# Patient Record
Sex: Female | Born: 1970
Health system: Southern US, Community
[De-identification: ages and names within clinical notes are randomized; demographics above are authoritative.]

## PROBLEM LIST (undated history)

## (undated) DIAGNOSIS — IMO0002 Reserved for concepts with insufficient information to code with codable children: Secondary | ICD-10-CM

## (undated) DIAGNOSIS — B20 Human immunodeficiency virus [HIV] disease: Secondary | ICD-10-CM

## (undated) DIAGNOSIS — Z87898 Personal history of other specified conditions: Secondary | ICD-10-CM

## (undated) DIAGNOSIS — F32A Depression, unspecified: Secondary | ICD-10-CM

## (undated) DIAGNOSIS — R87619 Unspecified abnormal cytological findings in specimens from cervix uteri: Secondary | ICD-10-CM

## (undated) DIAGNOSIS — F329 Major depressive disorder, single episode, unspecified: Secondary | ICD-10-CM

## (undated) DIAGNOSIS — M549 Dorsalgia, unspecified: Secondary | ICD-10-CM

## (undated) HISTORY — DX: Unspecified abnormal cytological findings in specimens from cervix uteri: R87.619

## (undated) HISTORY — DX: Dorsalgia, unspecified: M54.9

## (undated) HISTORY — PX: NO PAST SURGERIES: SHX2092

## (undated) HISTORY — DX: Reserved for concepts with insufficient information to code with codable children: IMO0002

## (undated) HISTORY — DX: Major depressive disorder, single episode, unspecified: F32.9

## (undated) HISTORY — DX: Human immunodeficiency virus (HIV) disease: B20

## (undated) HISTORY — DX: Depression, unspecified: F32.A

---

## 2008-10-15 ENCOUNTER — Emergency Department (HOSPITAL_COMMUNITY): Admission: EM | Admit: 2008-10-15 | Discharge: 2008-10-15 | Payer: Self-pay | Admitting: Emergency Medicine

## 2009-01-01 ENCOUNTER — Encounter: Payer: Self-pay | Admitting: Infectious Disease

## 2009-01-01 ENCOUNTER — Emergency Department (HOSPITAL_COMMUNITY): Admission: EM | Admit: 2009-01-01 | Discharge: 2009-01-01 | Payer: Self-pay | Admitting: Family Medicine

## 2009-02-19 ENCOUNTER — Emergency Department (HOSPITAL_COMMUNITY): Admission: EM | Admit: 2009-02-19 | Discharge: 2009-02-19 | Payer: Self-pay | Admitting: Emergency Medicine

## 2009-02-21 ENCOUNTER — Emergency Department (HOSPITAL_COMMUNITY): Admission: EM | Admit: 2009-02-21 | Discharge: 2009-02-21 | Payer: Self-pay | Admitting: Emergency Medicine

## 2009-03-08 ENCOUNTER — Ambulatory Visit: Payer: Self-pay | Admitting: Nurse Practitioner

## 2009-03-08 DIAGNOSIS — M545 Low back pain: Secondary | ICD-10-CM

## 2009-03-08 DIAGNOSIS — J309 Allergic rhinitis, unspecified: Secondary | ICD-10-CM | POA: Insufficient documentation

## 2009-03-11 ENCOUNTER — Ambulatory Visit: Payer: Self-pay | Admitting: *Deleted

## 2009-09-12 ENCOUNTER — Emergency Department (HOSPITAL_COMMUNITY): Admission: EM | Admit: 2009-09-12 | Discharge: 2009-09-12 | Payer: Self-pay | Admitting: Family Medicine

## 2009-09-17 ENCOUNTER — Emergency Department (HOSPITAL_COMMUNITY): Admission: EM | Admit: 2009-09-17 | Discharge: 2009-09-17 | Payer: Self-pay | Admitting: Emergency Medicine

## 2009-10-07 ENCOUNTER — Ambulatory Visit: Payer: Self-pay | Admitting: Infectious Disease

## 2009-10-07 DIAGNOSIS — B20 Human immunodeficiency virus [HIV] disease: Secondary | ICD-10-CM

## 2009-10-07 DIAGNOSIS — Z21 Asymptomatic human immunodeficiency virus [HIV] infection status: Secondary | ICD-10-CM | POA: Insufficient documentation

## 2009-10-07 LAB — CONVERTED CEMR LAB
Albumin: 3.5 g/dL (ref 3.5–5.2)
Alkaline Phosphatase: 60 units/L (ref 39–117)
CO2: 20 meq/L (ref 19–32)
Casts: NONE SEEN /lpf
Chlamydia, Swab/Urine, PCR: NEGATIVE
Chloride: 102 meq/L (ref 96–112)
Crystals: NONE SEEN
GC Probe Amp, Urine: NEGATIVE
Glucose, Bld: 106 mg/dL — ABNORMAL HIGH (ref 70–99)
HCV Ab: REACTIVE — AB
HDL: 34 mg/dL — ABNORMAL LOW (ref >39)
HIV-1 antibody: POSITIVE — AB
HIV-2 Ab: UNDETERMINED — AB
Hep B Core Total Ab: POSITIVE — AB
Leukocytes, UA: NEGATIVE
Lymphocytes Relative: 40 % (ref 12–46)
Lymphs Abs: 1.3 10*3/uL (ref 0.7–4.0)
MCHC: 33 g/dL (ref 30.0–36.0)
Monocytes Absolute: 0.5 10*3/uL (ref 0.1–1.0)
Monocytes Relative: 15 % — ABNORMAL HIGH (ref 3–12)
Neutro Abs: 1.3 10*3/uL — ABNORMAL LOW (ref 1.7–7.7)
Neutrophils Relative %: 42 % — ABNORMAL LOW (ref 43–77)
Protein, ur: NEGATIVE mg/dL
RBC: 3.61 M/uL — ABNORMAL LOW (ref 3.87–5.11)
Squamous Epithelial / LPF: NONE SEEN /lpf
Total CHOL/HDL Ratio: 5
Urine Glucose: NEGATIVE mg/dL
Urobilinogen, UA: 0.2 (ref 0.0–1.0)
VLDL: 29 mg/dL (ref 0–40)
WBC: 3.1 10*3/uL — ABNORMAL LOW (ref 4.0–10.5)
pH: 6.5 (ref 5.0–8.0)

## 2009-10-09 ENCOUNTER — Emergency Department (HOSPITAL_COMMUNITY): Admission: EM | Admit: 2009-10-09 | Discharge: 2009-10-09 | Payer: Self-pay | Admitting: Family Medicine

## 2009-10-21 ENCOUNTER — Ambulatory Visit: Payer: Self-pay | Admitting: Infectious Disease

## 2009-10-21 DIAGNOSIS — H9319 Tinnitus, unspecified ear: Secondary | ICD-10-CM | POA: Insufficient documentation

## 2009-10-21 DIAGNOSIS — R519 Headache, unspecified: Secondary | ICD-10-CM | POA: Insufficient documentation

## 2009-10-21 DIAGNOSIS — R209 Unspecified disturbances of skin sensation: Secondary | ICD-10-CM | POA: Insufficient documentation

## 2009-10-21 DIAGNOSIS — R51 Headache: Secondary | ICD-10-CM

## 2009-10-21 DIAGNOSIS — N72 Inflammatory disease of cervix uteri: Secondary | ICD-10-CM

## 2009-10-21 LAB — CONVERTED CEMR LAB
Chloride: 102 meq/L (ref 96–112)
Crypto Ag: NEGATIVE
HCT: 28.9 % — ABNORMAL LOW (ref 36.0–46.0)
Hemoglobin: 10 g/dL — ABNORMAL LOW (ref 12.0–15.0)
Lymphs Abs: 1.5 10*3/uL (ref 0.7–4.0)
MCHC: 34.8 g/dL (ref 30.0–36.0)
Platelets: 202 10*3/uL (ref 150–400)
Potassium: 3.7 meq/L (ref 3.5–5.3)
RDW: 15.2 % (ref 11.5–15.5)
WBC: 4.4 10*3/uL (ref 4.0–10.5)

## 2009-12-04 ENCOUNTER — Encounter: Payer: Self-pay | Admitting: Infectious Disease

## 2009-12-04 ENCOUNTER — Emergency Department (HOSPITAL_COMMUNITY): Admission: EM | Admit: 2009-12-04 | Discharge: 2009-12-04 | Payer: Self-pay | Admitting: Emergency Medicine

## 2009-12-14 ENCOUNTER — Emergency Department (HOSPITAL_COMMUNITY): Admission: EM | Admit: 2009-12-14 | Discharge: 2009-12-14 | Payer: Self-pay | Admitting: Family Medicine

## 2010-01-08 ENCOUNTER — Emergency Department (HOSPITAL_COMMUNITY): Admission: EM | Admit: 2010-01-08 | Discharge: 2010-01-08 | Payer: Self-pay | Admitting: Family Medicine

## 2010-11-23 ENCOUNTER — Encounter: Payer: Self-pay | Admitting: Infectious Disease

## 2010-11-24 ENCOUNTER — Encounter: Payer: Self-pay | Admitting: Internal Medicine

## 2011-02-03 LAB — T-HELPER CELL (CD4) - (RCID CLINIC ONLY): CD4 % Helper T Cell: 12 % — ABNORMAL LOW (ref 33–55)

## 2011-02-04 LAB — CBC
MCHC: 34.4 g/dL (ref 30.0–36.0)
MCV: 88.7 fL (ref 78.0–100.0)
Platelets: 157 10*3/uL (ref 150–400)
RBC: 3.28 MIL/uL — ABNORMAL LOW (ref 3.87–5.11)
WBC: 4.8 10*3/uL (ref 4.0–10.5)

## 2011-02-04 LAB — DIFFERENTIAL
Basophils Relative: 0 % (ref 0–1)
Eosinophils Absolute: 0.8 10*3/uL — ABNORMAL HIGH (ref 0.0–0.7)
Lymphocytes Relative: 25 % (ref 12–46)
Lymphs Abs: 1.2 10*3/uL (ref 0.7–4.0)
Monocytes Absolute: 0.6 10*3/uL (ref 0.1–1.0)
Neutrophils Relative %: 46 % (ref 43–77)

## 2011-02-12 LAB — POCT I-STAT, CHEM 8
BUN: 8 mg/dL (ref 6–23)
Calcium, Ion: 1.25 mmol/L (ref 1.12–1.32)
Chloride: 102 mEq/L (ref 96–112)
Creatinine, Ser: 0.9 mg/dL (ref 0.4–1.2)
Glucose, Bld: 90 mg/dL (ref 70–99)
Hemoglobin: 13.3 g/dL (ref 12.0–15.0)
TCO2: 31 mmol/L (ref 0–100)

## 2011-02-12 LAB — POCT URINALYSIS DIP (DEVICE)
Nitrite: NEGATIVE
Protein, ur: NEGATIVE mg/dL
Specific Gravity, Urine: 1.01 (ref 1.005–1.030)
Urobilinogen, UA: 0.2 mg/dL (ref 0.0–1.0)

## 2011-02-12 LAB — CBC
HCT: 33.5 % — ABNORMAL LOW (ref 36.0–46.0)
WBC: 3.5 10*3/uL — ABNORMAL LOW (ref 4.0–10.5)

## 2011-02-12 LAB — DIFFERENTIAL
Basophils Absolute: 0 10*3/uL (ref 0.0–0.1)
Basophils Relative: 0 % (ref 0–1)
Lymphocytes Relative: 35 % (ref 12–46)
Lymphs Abs: 1.2 10*3/uL (ref 0.7–4.0)
Monocytes Absolute: 0.5 10*3/uL (ref 0.1–1.0)
Monocytes Relative: 15 % — ABNORMAL HIGH (ref 3–12)
Neutro Abs: 1.7 10*3/uL (ref 1.7–7.7)

## 2011-06-05 ENCOUNTER — Other Ambulatory Visit (INDEPENDENT_AMBULATORY_CARE_PROVIDER_SITE_OTHER): Payer: 59

## 2011-06-05 ENCOUNTER — Encounter: Payer: Self-pay | Admitting: Internal Medicine

## 2011-06-05 DIAGNOSIS — N72 Inflammatory disease of cervix uteri: Secondary | ICD-10-CM

## 2011-06-05 DIAGNOSIS — B20 Human immunodeficiency virus [HIV] disease: Secondary | ICD-10-CM

## 2011-06-05 DIAGNOSIS — Z79899 Other long term (current) drug therapy: Secondary | ICD-10-CM

## 2011-06-05 DIAGNOSIS — Z113 Encounter for screening for infections with a predominantly sexual mode of transmission: Secondary | ICD-10-CM

## 2011-06-05 LAB — URINALYSIS, ROUTINE W REFLEX MICROSCOPIC
Ketones, ur: NEGATIVE mg/dL
Nitrite: NEGATIVE
Specific Gravity, Urine: 1.011 (ref 1.005–1.030)
Urobilinogen, UA: 0.2 mg/dL (ref 0.0–1.0)
pH: 6.5 (ref 5.0–8.0)

## 2011-06-05 LAB — LIPID PANEL
Cholesterol: 170 mg/dL (ref 0–200)
HDL: 35 mg/dL — ABNORMAL LOW (ref >39)
LDL Cholesterol: 119 mg/dL — ABNORMAL HIGH (ref 0–99)
VLDL: 16 mg/dL (ref 0–40)

## 2011-06-05 LAB — COMPREHENSIVE METABOLIC PANEL
AST: 33 U/L (ref 0–37)
Albumin: 3.2 g/dL — ABNORMAL LOW (ref 3.5–5.2)
Alkaline Phosphatase: 69 U/L (ref 39–117)
BUN: 18 mg/dL (ref 6–23)
CO2: 23 mEq/L (ref 19–32)
Calcium: 8.9 mg/dL (ref 8.4–10.5)
Chloride: 104 mEq/L (ref 96–112)
Glucose, Bld: 114 mg/dL — ABNORMAL HIGH (ref 70–99)
Potassium: 3.7 mEq/L (ref 3.5–5.3)
Total Bilirubin: 0.2 mg/dL — ABNORMAL LOW (ref 0.3–1.2)

## 2011-06-05 LAB — CBC WITH DIFFERENTIAL/PLATELET
HCT: 30.1 % — ABNORMAL LOW (ref 36.0–46.0)
Hemoglobin: 9.9 g/dL — ABNORMAL LOW (ref 12.0–15.0)
Lymphs Abs: 1 10*3/uL (ref 0.7–4.0)
MCH: 29 pg (ref 26.0–34.0)
MCHC: 32.9 g/dL (ref 30.0–36.0)
MCV: 88.3 fL (ref 78.0–100.0)
Monocytes Relative: 11 % (ref 3–12)
Neutrophils Relative %: 56 % (ref 43–77)
Platelets: 140 10*3/uL — ABNORMAL LOW (ref 150–400)
RDW: 15.5 % (ref 11.5–15.5)

## 2011-06-05 LAB — T-HELPER CELL (CD4) - (RCID CLINIC ONLY)
CD4 % Helper T Cell: 7 % — ABNORMAL LOW (ref 33–55)
CD4 T Cell Abs: 70 uL — ABNORMAL LOW (ref 400–2700)

## 2011-06-06 LAB — HEPATITIS C ANTIBODY: HCV Ab: NEGATIVE

## 2011-06-06 LAB — URINALYSIS, MICROSCOPIC ONLY: Casts: NONE SEEN

## 2011-06-06 LAB — GC/CHLAMYDIA PROBE AMP, URINE: GC Probe Amp, Urine: NEGATIVE

## 2011-06-11 NOTE — Progress Notes (Signed)
BC enrolled pt into the bridge counseling program today. Pt had lab work today and will return on August 17th to see Dr, Luciana Axe and discuss medications.

## 2011-06-19 ENCOUNTER — Encounter: Payer: Self-pay | Admitting: Internal Medicine

## 2011-06-19 ENCOUNTER — Ambulatory Visit: Payer: Self-pay

## 2011-06-19 ENCOUNTER — Other Ambulatory Visit: Payer: Self-pay | Admitting: *Deleted

## 2011-06-19 ENCOUNTER — Ambulatory Visit (INDEPENDENT_AMBULATORY_CARE_PROVIDER_SITE_OTHER): Payer: 59 | Admitting: Internal Medicine

## 2011-06-19 VITALS — BP 112/73 | HR 102 | Temp 98.7°F | Ht 62.0 in | Wt 115.0 lb

## 2011-06-19 DIAGNOSIS — B20 Human immunodeficiency virus [HIV] disease: Secondary | ICD-10-CM

## 2011-06-19 DIAGNOSIS — Z23 Encounter for immunization: Secondary | ICD-10-CM

## 2011-06-19 LAB — HIV-1 GENOTYPR PLUS

## 2011-06-19 MED ORDER — DAPSONE 100 MG PO TABS: 100.0000 mg | ORAL_TABLET | Freq: Every day | ORAL | Status: DC

## 2011-06-19 MED ORDER — DARUNAVIR ETHANOLATE 400 MG PO TABS: 800.0000 mg | ORAL_TABLET | Freq: Every day | ORAL | Status: DC

## 2011-06-19 MED ORDER — RITONAVIR 100 MG PO TABS: 100.0000 mg | ORAL_TABLET | Freq: Every day | ORAL | Status: DC

## 2011-06-19 MED ORDER — ENSURE PO LIQD: 1.0000 | Freq: Two times a day (BID) | ORAL | Status: DC

## 2011-06-19 MED ORDER — EMTRICITABINE-TENOFOVIR DF 200-300 MG PO TABS: 1.0000 | ORAL_TABLET | Freq: Every day | ORAL | Status: DC

## 2011-06-19 NOTE — Assessment & Plan Note (Addendum)
I discussed with her at length her treatment options and the need for complete adherrence with the medications.  She is a little hesitant to take meds and is afraid that she will forget, but I gave her many ways (cell alarms, times of day) to try to overcome that.  She states she is though ready to start and eager to get the virus undercontrol.  I did discuss condom use and that she should have her husband tested as well.  I also will start her on dapsone for PCP prophylaxis as she had an intolerance to sulfa.  She will start boosted Prezista and Truvada when she is signed up for ADAP and will check labs soon after that.  Her creat is mildly elevated so will be watched closely.  I will check an HLA [id number] in case that needs to be changed.     I also will get her ensure for her weight loss.  I also asked her to get her recent PAP records and bring it next visit.

## 2011-06-19 NOTE — Progress Notes (Signed)
  Subjective:    Patient ID: [name], female    DOB: 05/06/1971, 40 y.o.   MRN: [id number]  HPI here to reestablish care for her HIV.  Had been here previously about 2 years ago and was initially going to start  Boosted Prezista and Truvada but did not continue to follow up or get ADAP.  She comes back today to again consider starting her meds again.  She has previously been on Bactrim but reported side effects from it and has not been on it since.  She otherwise reports no hospitalizations, STIs and she states she did [facility].  She is married and her partner does know her status.  She also reports some recent weight loss.      Review of Systems  Constitutional: Positive for fatigue and unexpected weight change. Negative for activity change.  HENT: Negative.   Eyes: Negative.   Respiratory: Negative.   Cardiovascular: Negative.   Gastrointestinal: Negative.   Genitourinary: Negative.   Musculoskeletal: Negative.   Skin: Negative.   Neurological: Negative.   Hematological: Negative.   Psychiatric/Behavioral: Negative.        Objective:   Physical Exam  Constitutional: She is oriented to person, place, and time. She appears well-developed.       thin  Neck: Normal range of motion. Neck supple.  Cardiovascular: Normal rate, regular rhythm and normal heart sounds.   No murmur heard. Pulmonary/Chest: Effort normal and breath sounds normal. No respiratory distress.  Abdominal: Soft. Bowel sounds are normal. She exhibits no distension.  Lymphadenopathy:    She has no cervical adenopathy.  Neurological: She is alert and oriented to person, place, and time.  Skin: Skin is warm and dry. No erythema.  Psychiatric: She has a normal mood and affect. Her behavior is normal.          Assessment & Plan:

## 2011-06-29 ENCOUNTER — Ambulatory Visit: Payer: Self-pay

## 2011-07-17 ENCOUNTER — Other Ambulatory Visit: Payer: Self-pay | Admitting: *Deleted

## 2011-07-17 ENCOUNTER — Ambulatory Visit (INDEPENDENT_AMBULATORY_CARE_PROVIDER_SITE_OTHER): Payer: 59 | Admitting: *Deleted

## 2011-07-17 DIAGNOSIS — B20 Human immunodeficiency virus [HIV] disease: Secondary | ICD-10-CM

## 2011-07-17 DIAGNOSIS — Z23 Encounter for immunization: Secondary | ICD-10-CM

## 2011-07-17 MED ORDER — DAPSONE 100 MG PO TABS: 100.0000 mg | ORAL_TABLET | Freq: Every day | ORAL | Status: DC

## 2011-08-03 ENCOUNTER — Emergency Department (HOSPITAL_COMMUNITY)
Admission: EM | Admit: 2011-08-03 | Discharge: 2011-08-04 | Payer: 59 | Source: Home / Self Care | Attending: Emergency Medicine | Admitting: Emergency Medicine

## 2011-08-03 ENCOUNTER — Emergency Department (HOSPITAL_COMMUNITY): Payer: 59

## 2011-08-03 LAB — RAPID URINE DRUG SCREEN, HOSP PERFORMED
Opiates: NOT DETECTED
Tetrahydrocannabinol: NOT DETECTED

## 2011-08-03 LAB — CBC
MCH: 31.8 pg (ref 26.0–34.0)
MCV: 95.8 fL (ref 78.0–100.0)
Platelets: 142 10*3/uL — ABNORMAL LOW (ref 150–400)
RDW: 15.2 % (ref 11.5–15.5)
WBC: 3.7 10*3/uL — ABNORMAL LOW (ref 4.0–10.5)

## 2011-08-03 LAB — URINALYSIS, ROUTINE W REFLEX MICROSCOPIC
Leukocytes, UA: NEGATIVE
Nitrite: NEGATIVE
Specific Gravity, Urine: 1.012 (ref 1.005–1.030)
pH: 7 (ref 5.0–8.0)

## 2011-08-03 LAB — POCT PREGNANCY, URINE: Preg Test, Ur: NEGATIVE

## 2011-08-03 LAB — DIFFERENTIAL
Basophils Absolute: 0 10*3/uL (ref 0.0–0.1)
Basophils Relative: 0 % (ref 0–1)
Eosinophils Absolute: 0.6 10*3/uL (ref 0.0–0.7)
Lymphocytes Relative: 60 % — ABNORMAL HIGH (ref 12–46)
Monocytes Absolute: 0.3 10*3/uL (ref 0.1–1.0)
Neutrophils Relative %: 14 % — ABNORMAL LOW (ref 43–77)

## 2011-08-03 LAB — COMPREHENSIVE METABOLIC PANEL
ALT: 17 U/L (ref 0–35)
AST: 26 U/L (ref 0–37)
Albumin: 3.3 g/dL — ABNORMAL LOW (ref 3.5–5.2)
Calcium: 9.8 mg/dL (ref 8.4–10.5)
Chloride: 102 mEq/L (ref 96–112)
Creatinine, Ser: 0.83 mg/dL (ref 0.50–1.10)
Sodium: 134 mEq/L — ABNORMAL LOW (ref 135–145)

## 2011-08-03 LAB — AMMONIA: Ammonia: <10 umol/L — ABNORMAL LOW (ref 11–60)

## 2011-08-03 LAB — PATHOLOGIST SMEAR REVIEW

## 2011-08-03 MED ORDER — GADOBENATE DIMEGLUMINE 529 MG/ML IV SOLN
10.0000 mL | Freq: Once | INTRAVENOUS | Status: AC | PRN
  Administered 2011-08-03: 10 mL via INTRAVENOUS

## 2011-08-04 ENCOUNTER — Other Ambulatory Visit: Payer: Self-pay | Admitting: Diagnostic Radiology

## 2011-08-04 ENCOUNTER — Inpatient Hospital Stay (HOSPITAL_COMMUNITY): Payer: 59

## 2011-08-04 ENCOUNTER — Encounter: Payer: Self-pay | Admitting: Internal Medicine

## 2011-08-04 ENCOUNTER — Inpatient Hospital Stay (HOSPITAL_COMMUNITY)
Admission: AD | Admit: 2011-08-04 | Discharge: 2011-08-21 | DRG: 975 | Disposition: A | Discharge status: Skilled Nursing Facility | Payer: 59 | Source: Other Acute Inpatient Hospital | Attending: Internal Medicine | Admitting: Internal Medicine

## 2011-08-04 DIAGNOSIS — B20 Human immunodeficiency virus [HIV] disease: Principal | ICD-10-CM | POA: Diagnosis present

## 2011-08-04 DIAGNOSIS — F329 Major depressive disorder, single episode, unspecified: Secondary | ICD-10-CM | POA: Diagnosis present

## 2011-08-04 DIAGNOSIS — N179 Acute kidney failure, unspecified: Secondary | ICD-10-CM | POA: Diagnosis present

## 2011-08-04 DIAGNOSIS — R6510 Systemic inflammatory response syndrome (SIRS) of non-infectious origin without acute organ dysfunction: Secondary | ICD-10-CM | POA: Diagnosis present

## 2011-08-04 DIAGNOSIS — E876 Hypokalemia: Secondary | ICD-10-CM | POA: Diagnosis present

## 2011-08-04 DIAGNOSIS — A89 Unspecified viral infection of central nervous system: Secondary | ICD-10-CM

## 2011-08-04 DIAGNOSIS — F3289 Other specified depressive episodes: Secondary | ICD-10-CM | POA: Diagnosis present

## 2011-08-04 DIAGNOSIS — B9789 Other viral agents as the cause of diseases classified elsewhere: Secondary | ICD-10-CM | POA: Diagnosis present

## 2011-08-04 DIAGNOSIS — F29 Unspecified psychosis not due to a substance or known physiological condition: Secondary | ICD-10-CM | POA: Diagnosis present

## 2011-08-04 DIAGNOSIS — G9340 Encephalopathy, unspecified: Secondary | ICD-10-CM

## 2011-08-04 DIAGNOSIS — Z9119 Patient's noncompliance with other medical treatment and regimen: Secondary | ICD-10-CM

## 2011-08-04 DIAGNOSIS — Z91199 Patient's noncompliance with other medical treatment and regimen due to unspecified reason: Secondary | ICD-10-CM

## 2011-08-04 DIAGNOSIS — G9349 Other encephalopathy: Secondary | ICD-10-CM | POA: Diagnosis present

## 2011-08-04 DIAGNOSIS — Z79899 Other long term (current) drug therapy: Secondary | ICD-10-CM

## 2011-08-04 DIAGNOSIS — T50995A Adverse effect of other drugs, medicaments and biological substances, initial encounter: Secondary | ICD-10-CM | POA: Diagnosis present

## 2011-08-04 DIAGNOSIS — Z23 Encounter for immunization: Secondary | ICD-10-CM

## 2011-08-04 LAB — CBC
MCV: 95.1 fL (ref 78.0–100.0)
Platelets: 143 10*3/uL — ABNORMAL LOW (ref 150–400)
RBC: 3.26 MIL/uL — ABNORMAL LOW (ref 3.87–5.11)
WBC: 3.3 10*3/uL — ABNORMAL LOW (ref 4.0–10.5)

## 2011-08-04 LAB — CSF CELL COUNT WITH DIFFERENTIAL
RBC Count, CSF: 135 /mm3 — ABNORMAL HIGH
WBC, CSF: 4 /mm3 (ref 0–5)

## 2011-08-04 LAB — DIFFERENTIAL
Eosinophils Relative: 11 % — ABNORMAL HIGH (ref 0–5)
Lymphocytes Relative: 54 % — ABNORMAL HIGH (ref 12–46)
Monocytes Absolute: 0.4 10*3/uL (ref 0.1–1.0)
Monocytes Relative: 11 % (ref 3–12)
Neutrophils Relative %: 23 % — ABNORMAL LOW (ref 43–77)

## 2011-08-04 LAB — COMPREHENSIVE METABOLIC PANEL
AST: 23 U/L (ref 0–37)
Albumin: 3.2 g/dL — ABNORMAL LOW (ref 3.5–5.2)
Calcium: 9.6 mg/dL (ref 8.4–10.5)
Chloride: 108 mEq/L (ref 96–112)
Creatinine, Ser: 0.78 mg/dL (ref 0.50–1.10)
Total Bilirubin: 0.3 mg/dL (ref 0.3–1.2)

## 2011-08-04 LAB — APTT: aPTT: 35 seconds (ref 24–37)

## 2011-08-04 LAB — CRYPTOCOCCAL ANTIGEN, CSF: Crypto Ag: NEGATIVE

## 2011-08-04 LAB — HERPES SIMPLEX VIRUS(HSV) DNA BY PCR: HSV 1 DNA: NOT DETECTED

## 2011-08-04 LAB — MRSA PCR SCREENING: MRSA by PCR: NEGATIVE

## 2011-08-04 LAB — PROTIME-INR: INR: 1.14 (ref 0.00–1.49)

## 2011-08-04 NOTE — H&P (Signed)
Hospital Admission Note Date: 08/04/2011  Patient name: [name] Medical record number: [id number] Date of birth: 17-Mar-1971 Age: 40 y.o. Gender: female PCP: No primary provider on file.  Medical Service:  IM teaching serive  Attending physician:  Dr. Cliffton Asters  Pager: Resident (R2/R3): Dr. Saralyn Pilar  Pager: [phone] Resident (R1):Dr. Clyde Lundborg    Pager: [phone]  Chief Complaint:   History of Present Illness: Patient has been on hospitality service with critical care and neurology consulting. She is being transferred to teaching service today.   40 y/o woman with pmh of HIV, CD4 count 70 as of 06/2011 not on any antiretroviral due to non compliance was brought to [facility] by patients brother for confusion that has been going on for 1 week. She is currently sedated and unable to answer questions. The history in obtained from patient's chart and other physicians. The patient has been leaving food on the stove, unable to turn on warm water for bathing purpose, has been unsteady on her feet and has been unable to recognize family members. Her symptoms are getting progressively worse. The brother suspected that her depression his getting worse causing all these symptoms.  Patient is aware of her HIV status but has not been taking any medications. She was seen in ID clinic 2 years ago at which time she was to start taking boosted prezista and truvada but was lost to follow up. She was seen in the [facility] on 8/12 to re-establish care.   Meds: None  Allergies: NKDA PMH: HIV:  who was seen in the [place], [place] in 2010 to establish care. She was lost to follow up for 2 years after that and was not on any medications. She returned to see Dr. Luciana Axe in Aug 2012 at which time time her CD4 count was 70 and VL 243000  SH- unable to obtain FH- unable to obtain ROS- unable to obtain  Physical Exam  VS: T- 98.6, P- 59-102, RR- 13-17, BP- 93-139/69-80, O2- 100% RA  General Appearance:    Sedated  (recived ativan for LP), arousable and answers simple questions and follows 1 step commands  Head:    Normocephalic, without obvious abnormality, atraumatic  Eyes:    PERRL, conjunctiva/corneas clear, EOM's intact, fundi    benign, both eyes  Throat:   Lips, mucosa, and tongue normal; teeth and gums normal  Neck:   Supple, symmetrical, trachea midline, no adenopathy;    thyroid:  no enlargement/tenderness/nodules; no carotid   bruit or JVD  Back:     Symmetric, no curvature, ROM normal, no CVA tenderness  Lungs:     Clear to auscultation bilaterally, respirations unlabored  Chest Wall:    No tenderness or deformity   Heart:    Regular rate and rhythm, S1 and S2 normal, no murmur, rub   or gallop  Abdomen:     Soft, non-tender, bowel sounds active all four quadrants,    no masses, no organomegaly  Extremities:   Extremities normal, atraumatic, no cyanosis or edema  Pulses:   2+ and symmetric all extremities  Skin:   Skin color, texture, turgor normal, no rashes or lesions  Lymph nodes:   Cervical, supraclavicular, and axillary nodes normal  Neurologic:  Drowsy but arousable, cranial nerves appear intact, moves all four extrmeity, slurred mumbling speech, unable to assess sensations, gait or cerebellar signs.      Lab results: Basic Metabolic Panel:  Basename 08/04/11 0635 08/03/11 1050  NA 137 134*  K 3.4* 3.5  CL 108 102  CO2 22 25  GLUCOSE 75 135*  BUN 9 15  CREATININE 0.78 0.83  CALCIUM 9.6 9.8  MG 1.8 --  PHOS 3.8 --   Liver Function Tests:  [facility] 08/04/11 0635 08/03/11 1050  AST 23 26  ALT 14 17  ALKPHOS 67 71  BILITOT 0.3 0.3  PROT 9.2* 10.0*  ALBUMIN 3.2* 3.3*    Basename 08/03/11 1155  AMMONIA <10*   CBC:  Basename 08/04/11 0635 08/03/11 1050  WBC 3.3* 3.7*  NEUTROABS 0.8* 0.5*  HGB 10.4* 10.7*  HCT 31.0* 32.3*  MCV 95.1 95.8  PLT 143* 142*   Alcohol Level:  Basename 08/03/11 1050  ETH <11   Urinalysis: Color, Urine                              YELLOW            YELLOW  Appearance                               CLEAR             CLEAR  Specific Gravity                         1.012             1.005-1.030  pH                                       7.0               5.0-8.0  Urine Glucose                            NEGATIVE          NEG              mg/dL  Bilirubin                                NEGATIVE          NEG  Ketones                                  NEGATIVE          NEG              mg/dL  Blood                                    NEGATIVE          NEG  Protein                                  NEGATIVE          NEG              mg/dL  Urobilinogen  1.0               0.0-1.0          mg/dL  Nitrite                                  NEGATIVE          NEG  Leukocytes                               NEGATIVE          NEG  UDS- negative  CSF Glucose, CSF                             43                43-76            mg/dL  Total  Protein, CSF                      82         h      15-45            mg/dL   Color, CSF                               COLORLESS         COLR  Clarity                                  CLEAR             CLEAR  Supernatant                              SEE NOTE.    NOT INDICATED  WBC Count-CSF                            4                 0-5              /cu mm  RBC Count                                135        h      0                /cu mm  Segmented Neutrophils-CSF                RARE              0-6              %  Lymphocytes-CSF                          FEW               40-80            %  Monocyte-Macrophage-Spinal Fluid  RARE              15-45            %  Other Cells-CSF                          SEE NOTE.    TOO FEW TO COUNT, SMEAR AVAILABLE FOR REVIEW  Cryptococcal antigen- neg   Imaging results:  Dg Chest 2 View  08/03/2011  *RADIOLOGY REPORT*  Clinical Data: Confusion.  Altered mental status.  CHEST - 2 VIEW  Comparison:  12/04/2009  Findings:   The heart size and mediastinal contours are within normal limits.  Both lungs are clear.  Previously seen infiltrate in the left lower lobe has resolved.  The visualized skeletal structures are unremarkable.  IMPRESSION: No active cardiopulmonary disease.  Original Report Authenticated By: Danae Orleans, M.D.   Ct Head Wo Contrast  08/03/2011  *RADIOLOGY REPORT*  Clinical Data: Confusion, depression  CT HEAD WITHOUT CONTRAST  Technique:  Contiguous axial images were obtained from the base of the skull through the vertex without contrast.  Comparison: None.  Findings: There is patchy hypoattenuation in the deep white matter and posterior parietal lobes, right greater than left.  Negative for acute intracranial hemorrhage, midline shift, mass lesion, or mass effect. Acute infarction may be inapparent on noncontrast CT. Ventricles and sulci are normal in size and symmetry.  Bone windows reveal no calvarial lesion.  IMPRESSION: 1.  Negative for bleed or other acute intracranial process. 2.  Nonspecific white matter changes in the posterior parietal lobes.  Outpatient MR may be useful to differentiate  PRES from ischemic change, and exclude underlying lesions.  Original Report Authenticated By: Osa Craver, M.D.   Mr [name] XB Contrast  08/03/2011  *RADIOLOGY REPORT*  Clinical Data: Confusion.  Depression. HIV infection.  MRI HEAD WITHOUT AND WITH CONTRAST  Technique:  Multiplanar, multiecho pulse sequences of the brain and surrounding structures were obtained according to standard protocol without and with intravenous contrast  Contrast: 10mL MULTIHANCE GADOBENATE DIMEGLUMINE 529 MG/ML IV SOLN  Comparison: Head CT same day  Findings: The brainstem is normal.  There appears to be mild cerebellar atrophy.  The cerebral hemispheres show multiple widely disseminated foci of abnormal T2 and FLAIR signal primarily within the hemispheric white matter but with some cortical involvement most notably in the  region of the cingulate gyrus on the left and within the basal ganglia on the left.  Insular region on the left is also affected. There is no evidence of hemorrhage, mass lesion, hydrocephalus or extra-axial collection.  After contrast administration, there is patchy enhancement throughout many of the abnormalities.  No sinus or mastoid disease.  No pituitary lesion.  No skull or skull base lesion.  IMPRESSION: Supratentorial brain lesions widely dispersed throughout both hemispheres with edema and enhancement highly likely to represent to atypical infection related to HIV.  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  There is no evidence of hemorrhage, mass effect, shift or threatening herniation.  Original Report Authenticated By: Thomasenia Sales, M.D.   Dg Chest Portable 1 View  08/04/2011  *RADIOLOGY REPORT*  Clinical Data: Altered mental status. HIV infection.  Meningitis.  PORTABLE CHEST - 1 VIEW  Comparison: 08/03/2011  Findings: The patient is rotated to the right on today's exam, resulting in reduced diagnostic sensitivity and specificity. Cardiac and mediastinal contours appear normal.  The lungs appear clear.  No pleural effusion is identified.  IMPRESSION:  1.  No significant thoracic abnormality is observed.  Original Report Authenticated By: Dellia Cloud, M.D.   Dg Fluoro Guide Ndl Plc/bx  08/04/2011  *RADIOLOGY REPORT*  Clinical Data: HIV.  Meningitis. Altered mental status.  FLUORO GUIDED LUMBAR PUNCTURE  Comparison: MRI brain from 08/03/2011  Findings: Prior lumbar puncture attempt in the [facility] emergency department was unsuccessful.  The patient is unable to consent for herself due to altered mental status.  I discussed the risks (including hemorrhage, infection, nerve damage, and others), benefits, and alternatives to fluoroscopically guided lumbar puncture with the patient's husband by telephone.  He understood elected for his  wife to undergo the procedure.  Following sterile skin prep and local anesthetic administration consisting of 1% lidocaine, a 22 gauge needle was advanced into the thecal sac at the L2-3 level under fluoroscopic guidance.  The patient was agitated and it was not feasible to turn her on her side for a standard lateral decubitus opening pressure measurement. Measurement in the prone position yielded an opening pressure of 17 cm of water.  A total of 12 ml of CSF was obtained and distributed into four vials.  The CSF in the first vial appeared minimally blood tinged but subsequent tubes appear clear.  The needle was subsequently removed and the skin cleansed and bandaged.  No complications were observed.  IMPRESSION:  1.  Successful fluoroscopically guided lumbar puncture at the L2-3 level.  Original Report Authenticated By: Dellia Cloud, M.D.    Other results: EKG  Assessment & Plan by Problem:  1. AMS:  Given her CD4 of 70 or less, opportunistic infections with toxoplasma, atypical fungus, TB and CMV is a concern.  Cryptococcas in unlikely with negative pcr. Bacterial meningitis is unlikely based on CSF results. Lymphoma is also a possibility given the MRI appearance after discussing with neurology team.   - patient is able to protect her airway.  - will await cytology, cultures and other tests for CMV, Herpes and EBV - Patient was on amphotericin B since admission which was stopped today after cryptococcal pcr negative, she is on pyrimethamine, leucovorin and sulfadiazene for toxoplasma awating further results.  - May need a brain biopsy if above tests do not point at the cause of the brain lesions.  - ID, neurology and critical care following the patient.   2. HIV: Patient has never been on any HIV meds as far as our records are concerned. Will need a better history once patient is more responsive. Will check CD4 and viral load. She was on bactrim for prophylaxis in past but stopped taking  it due to intolerance. Will need dapsone and azithromycin once able to take po intake. Will defer HAART for now.   3. DVT Px-  SCD  R2/3______________________________      R1________________________________  ATTENDING: I performed and/or observed a history and physical examination of the patient.  I discussed the case with the residents as noted and reviewed the residents' notes.  I agree with the findings and plan--please refer to the attending physician note for more details.  Signature________________________________  Printed Name_____________________________

## 2011-08-05 ENCOUNTER — Telehealth: Payer: Self-pay | Admitting: *Deleted

## 2011-08-05 DIAGNOSIS — G9340 Encephalopathy, unspecified: Secondary | ICD-10-CM

## 2011-08-05 DIAGNOSIS — B20 Human immunodeficiency virus [HIV] disease: Secondary | ICD-10-CM

## 2011-08-05 LAB — VDRL, CSF: VDRL Quant, CSF: NONREACTIVE

## 2011-08-05 NOTE — Telephone Encounter (Signed)
Dr. Orvan Falconer wanted to know if pt ever got her meds. She is in ICU at this time. Britta Mccreedy, our financial person states she has not applied for ADAP. I checked with Sharol Roussel and she states pt has private insurance and she has gotten her meds. She has run out of all meds at the correct time & has gotten refills refills. Lab appt next week.

## 2011-08-06 DIAGNOSIS — B20 Human immunodeficiency virus [HIV] disease: Secondary | ICD-10-CM

## 2011-08-06 DIAGNOSIS — A89 Unspecified viral infection of central nervous system: Secondary | ICD-10-CM

## 2011-08-07 LAB — CYTOMEGALOVIRUS PCR, QUALITATIVE: Cytomegalovirus DNA: NOT DETECTED

## 2011-08-07 LAB — HIV-1 RNA QUANT-NO REFLEX-BLD: HIV-1 RNA Quant, Log: 2.53 {Log} — ABNORMAL HIGH (ref <1.30)

## 2011-08-07 LAB — CSF CULTURE W GRAM STAIN: Culture: NO GROWTH

## 2011-08-07 LAB — BASIC METABOLIC PANEL
BUN: 3 mg/dL — ABNORMAL LOW (ref 6–23)
CO2: 24 mEq/L (ref 19–32)
Calcium: 9.2 mg/dL (ref 8.4–10.5)
GFR calc non Af Amer: 66 mL/min — ABNORMAL LOW (ref >90)
Glucose, Bld: 74 mg/dL (ref 70–99)
Sodium: 138 mEq/L (ref 135–145)

## 2011-08-09 LAB — BASIC METABOLIC PANEL
CO2: 23 mEq/L (ref 19–32)
Calcium: 8.9 mg/dL (ref 8.4–10.5)
GFR calc non Af Amer: 66 mL/min — ABNORMAL LOW (ref >90)
Potassium: 3.1 mEq/L — ABNORMAL LOW (ref 3.5–5.1)
Sodium: 138 mEq/L (ref 135–145)

## 2011-08-09 LAB — MAGNESIUM: Magnesium: 1.6 mg/dL (ref 1.5–2.5)

## 2011-08-09 NOTE — Consult Note (Signed)
[name], [name]                  ACCOUNT NO.:  [id number]  MEDICAL RECORD NO.:  [id number]  LOCATION:  [facility]                         FACILITY:  [facility]  PHYSICIAN:  Pramod P. Pearlean Brownie, MD    DATE OF BIRTH:  [birth year]  DATE OF CONSULTATION: DATE OF DISCHARGE:                                CONSULTATION   REFERRING PHYSICIAN:  Otto Herb, MD  REASON FOR REFERRAL:  Confusion, altered mental status.  HISTORY OF PRESENT ILLNESS:  [name] is a 40 year old [place] African origin lady, who presented with 1-week history of confusion, disorientation, and some emotional lability with depression.  There is apparently no history of headache, focal extremity weakness, and numbness.  She has known history of HIV positive state for the last couple of years and she has been on antiretrovirals drugs.  The patient is unable to give detailed history and her husband cannot specifically recall if she has had any typical HIV infections, which were treated in the past.  The patient has abnormal CT scan on admission, show an extensive what appeared to be white matter disease, but an MRI scan has shown multiple small enhancing cortical and subcortical T2 and FLAIR white matter hyperintensities, which has shown announcement raising concern for chronic atypical infection, fungal versus TB or lymphoma.  PAST SURGICAL HISTORY:  As documented above, positive for HIV state.  ALLERGIES:  None listed.  HOME MEDICATIONS: 1. Norvir 1 capsule daily. 2. Truvada 200 mg daily. 3. Prezista 400 mg 2 tablets daily. 4. Dapsone 100 mg daily.  SOCIAL HISTORY:  The patient is married, lives with her husband, she is not working.  She does not abuse drugs at present or smoke or drink alcohol.  PHYSICAL EXAMINATION:  GENERAL:  Reveals young African lady, currently not in distress. VITAL SIGNS:  Temperature 98.4, pulse is 74 and regular, respiratory rate 18 per minute, blood pressure 105/68.  Distal pulses  are well felt. HEENT:  Head is nontraumatic. NECK:  Supple.  There is no neck stiffness.  Kernig's and Brudzinski signs are negative. CARDIAC EXAM:  Regular heart sounds. LUNGS:  Clear to auscultation. ABDOMEN:  Soft, nontender. NEUROLOGIC EXAM:  She is awake, alert.  She is disoriented to time, place, and person.  She has diminished attention, short-term memory and recall.  She follows simple one-step and occasional two-step commands only.  Eye movements are full range and no nystagmus.  She blinks to threat bilaterally.  Face is symmetric without weakness.  Tongue is midline.  Motor system exam reveals no upper or lower extremity drift. She is able to move all 4 extremities against gravity and symmetric in equal fashion.  There is no focal weakness.  Deep tendon reflexes are 1+ and symmetric.  Plantars are downgoing.  Touch, pinprick appeared to be preserved.  Coordination and gait cannot be reliably tested.  LABORATORY DATA:  Data reviewed, MRI scan of the brain done today shows multiple small enhancing nodular tiny lesions in subcortical regions bilaterally.  Most noticeable in the left cingulate gyrus and left insular region, which seem to be a meningeal based.  HIV test is positive.  IMPRESSION:  A 40 year old lady, who  is HIV positive with subacute onset of confusion, disorientation, and some emotional lability likely secondary to chronic atypical infection in the setting of a very positive state, which may be a fungal infection versus lymphoma or TB.  PLAN:  Recommend doing a spinal tap to obtain spinal fluid to send it for testing for the above infections.  Transferred to [facility] for [facility] evaluation.  Infectious Disease consult for management of her HIV status as well as infections.  On long discussion with the patient and her husband regarding above plan answered questions.  Kindly call for questions.  [facility] team to follow in the  morning.     Pramod P. Pearlean Brownie, MD     PPS/MEDQ  D:  08/03/2011  T:  08/04/2011  Job:  [id number]  Electronically Signed by Delia Heady MD on 08/09/2011 10:28:18 PM

## 2011-08-10 DIAGNOSIS — A89 Unspecified viral infection of central nervous system: Secondary | ICD-10-CM

## 2011-08-10 DIAGNOSIS — B20 Human immunodeficiency virus [HIV] disease: Secondary | ICD-10-CM

## 2011-08-10 LAB — CBC
Platelets: 131 10*3/uL — ABNORMAL LOW (ref 150–400)
RBC: 2.94 MIL/uL — ABNORMAL LOW (ref 3.87–5.11)
RDW: 14 % (ref 11.5–15.5)
WBC: 2.4 10*3/uL — ABNORMAL LOW (ref 4.0–10.5)

## 2011-08-10 LAB — MISCELLANEOUS TEST

## 2011-08-10 LAB — BASIC METABOLIC PANEL
CO2: 24 mEq/L (ref 19–32)
Chloride: 109 mEq/L (ref 96–112)
GFR calc Af Amer: 77 mL/min — ABNORMAL LOW (ref >90)
Potassium: 3.8 mEq/L (ref 3.5–5.1)

## 2011-08-12 DIAGNOSIS — B20 Human immunodeficiency virus [HIV] disease: Secondary | ICD-10-CM

## 2011-08-12 DIAGNOSIS — A89 Unspecified viral infection of central nervous system: Secondary | ICD-10-CM

## 2011-08-12 LAB — BASIC METABOLIC PANEL
BUN: 4 mg/dL — ABNORMAL LOW (ref 6–23)
CO2: 26 mEq/L (ref 19–32)
Chloride: 106 mEq/L (ref 96–112)
Creatinine, Ser: 0.98 mg/dL (ref 0.50–1.10)
Glucose, Bld: 84 mg/dL (ref 70–99)

## 2011-08-13 ENCOUNTER — Other Ambulatory Visit: Payer: 59

## 2011-08-13 LAB — CBC
Hemoglobin: 10.5 g/dL — ABNORMAL LOW (ref 12.0–15.0)
MCHC: 33.9 g/dL (ref 30.0–36.0)
RBC: 3.31 MIL/uL — ABNORMAL LOW (ref 3.87–5.11)

## 2011-08-14 DIAGNOSIS — A89 Unspecified viral infection of central nervous system: Secondary | ICD-10-CM

## 2011-08-14 DIAGNOSIS — B20 Human immunodeficiency virus [HIV] disease: Secondary | ICD-10-CM

## 2011-08-14 LAB — CBC
MCV: 93.8 fL (ref 78.0–100.0)
Platelets: 154 10*3/uL (ref 150–400)
RBC: 3.37 MIL/uL — ABNORMAL LOW (ref 3.87–5.11)
WBC: 2.1 10*3/uL — ABNORMAL LOW (ref 4.0–10.5)

## 2011-08-17 DIAGNOSIS — B20 Human immunodeficiency virus [HIV] disease: Secondary | ICD-10-CM

## 2011-08-17 DIAGNOSIS — A89 Unspecified viral infection of central nervous system: Secondary | ICD-10-CM

## 2011-08-17 LAB — BASIC METABOLIC PANEL
CO2: 28 mEq/L (ref 19–32)
Chloride: 100 mEq/L (ref 96–112)
Creatinine, Ser: 1.25 mg/dL — ABNORMAL HIGH (ref 0.50–1.10)
Sodium: 136 mEq/L (ref 135–145)

## 2011-08-18 LAB — BASIC METABOLIC PANEL
CO2: 27 mEq/L (ref 19–32)
Chloride: 104 mEq/L (ref 96–112)
Glucose, Bld: 133 mg/dL — ABNORMAL HIGH (ref 70–99)
Potassium: 3.3 mEq/L — ABNORMAL LOW (ref 3.5–5.1)
Sodium: 138 mEq/L (ref 135–145)

## 2011-08-19 DIAGNOSIS — B20 Human immunodeficiency virus [HIV] disease: Secondary | ICD-10-CM

## 2011-08-19 DIAGNOSIS — A89 Unspecified viral infection of central nervous system: Secondary | ICD-10-CM

## 2011-08-19 LAB — BASIC METABOLIC PANEL
BUN: 13 mg/dL (ref 6–23)
CO2: 25 mEq/L (ref 19–32)
Chloride: 105 mEq/L (ref 96–112)
Glucose, Bld: 80 mg/dL (ref 70–99)
Potassium: 3.6 mEq/L (ref 3.5–5.1)

## 2011-08-20 LAB — BASIC METABOLIC PANEL
BUN: 10 mg/dL (ref 6–23)
CO2: 23 mEq/L (ref 19–32)
Calcium: 9.5 mg/dL (ref 8.4–10.5)
GFR calc non Af Amer: 74 mL/min — ABNORMAL LOW (ref >90)
Glucose, Bld: 91 mg/dL (ref 70–99)

## 2011-08-20 LAB — CBC
HCT: 30.9 % — ABNORMAL LOW (ref 36.0–46.0)
Hemoglobin: 10.4 g/dL — ABNORMAL LOW (ref 12.0–15.0)
MCH: 31.6 pg (ref 26.0–34.0)
MCHC: 33.7 g/dL (ref 30.0–36.0)

## 2011-08-21 DIAGNOSIS — A89 Unspecified viral infection of central nervous system: Secondary | ICD-10-CM

## 2011-08-21 DIAGNOSIS — B20 Human immunodeficiency virus [HIV] disease: Secondary | ICD-10-CM

## 2011-08-21 LAB — CBC
HCT: 31.7 % — ABNORMAL LOW (ref 36.0–46.0)
MCH: 31.7 pg (ref 26.0–34.0)
MCV: 94.9 fL (ref 78.0–100.0)
Platelets: 176 10*3/uL (ref 150–400)
RBC: 3.34 MIL/uL — ABNORMAL LOW (ref 3.87–5.11)
RDW: 13.4 % (ref 11.5–15.5)

## 2011-08-21 LAB — BASIC METABOLIC PANEL
BUN: 9 mg/dL (ref 6–23)
CO2: 27 mEq/L (ref 19–32)
Calcium: 9.9 mg/dL (ref 8.4–10.5)
Creatinine, Ser: 1.08 mg/dL (ref 0.50–1.10)

## 2011-08-26 ENCOUNTER — Other Ambulatory Visit: Payer: Self-pay | Admitting: *Deleted

## 2011-08-26 DIAGNOSIS — B20 Human immunodeficiency virus [HIV] disease: Secondary | ICD-10-CM

## 2011-08-26 MED ORDER — ENSURE PO LIQD: 1.0000 | Freq: Two times a day (BID) | ORAL | Status: DC

## 2011-08-27 ENCOUNTER — Ambulatory Visit: Payer: 59 | Admitting: Internal Medicine

## 2011-08-31 NOTE — Discharge Summary (Signed)
[name], [name] NO.:  [id number]  MEDICAL RECORD NO.:  [id number]  LOCATION:  4504                         FACILITY:  [facility]  PHYSICIAN:  Cliffton Asters, M.D.    DATE OF BIRTH:  1971-01-10  DATE OF ADMISSION:  08/04/2011 DATE OF DISCHARGE:  08/21/2011                        DISCHARGE SUMMARY - REFERRING   ATTENDING PHYSICIAN:  Cliffton Asters, MD  DISCHARGE DIAGNOSES: 1. 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 2. Human immunodeficiency virus, CD4 count 110. 3. Depression. 4. Acute renal injury.  DISCHARGE MEDICATIONS: 1. Symbyax 6 mg/25 mg 1 capsule by mouth daily at bedtime. 2. Bactrim 160/800 mg 1 tablet by mouth every Monday, Wednesday, and     Friday. 3. Dapsone 100 mg 1 tablet by mouth daily. 4. Norvir 100 mg 1 capsule by mouth daily. 5. Prezista 400 mg 2 tablets by mouth daily. 6. Truvada 200/300 mg 1 tablet by mouth daily. 7. Ensure 237 mL by mouth twice daily between meals.  DISPOSITION AND FOLLOWUP:  The patient was discharged from [facility] in improved condition, though still with some gait instability and with some alteration of mental status, though both are markedly improved since admission.  The patient will be discharged to skilled nursing facility for further recovery.  The patient will follow up with Dr. Denton Meek in the Outpatient Clinic on September 01, 2011, at 9:45 a.m., at which time she will check a BMET to ensure resolution of acute renal failure and assess the patient's mental status as she recovers from her immune reconstitution syndrome.  The patient will also follow up with Dr. Luciana Axe in Infectious Disease on September 03, 2011, at 9:45 a.m. for further management of her HIV.  PROCEDURES PERFORMED: 1. Chest x-ray August 04, 2011, no significant abnormality was     observed. 2. Lumbar puncture August 04, 2011, PCR positive for JC virus. 3. CT head, nonspecific white  matter changes in the posteroparietal     lobes.  No acute bleed. 4. MR brain August 03, 2011, supratentorial brain lesions, widely     dispersed throughout both hemispheres with edema and enhancement.     No evidence of hemorrhage.  CONSULTATIONS:  Neurology.  ADMITTING HISTORY AND PHYSICAL:  The patient is a 40 year old woman with a history of HIV, CD4 count of 70 as of August 2012, who started HAART therapy 6 weeks prior to admission, was brought to the [facility] by the patient's brother for confusion that has been going on for 1 week.  She is currently sedated and unable to answer questions. The history is obtained from the patient's chart and other physicians. The patient's brother notes that she has been leaving food on the stove, unable to turn on warm water for bathing purposes, has been unsteady on her feet, and has been unable to recognize family members.  Her symptoms have progressively worsened.  The brother suspects that her depression is worsening, causing all of these symptoms.  The patient is aware of her HIV status and was seen 2 months ago in the Infectious Disease Clinic.  She started HAART therapy about 6 weeks ago.  PHYSICAL EXAMINATION:  VITAL SIGNS:  Temperature 98.6, pulse 59-102, blood pressure 93-139/69-80, respirations 13-17, oxygen saturation 100% on room air. GENERAL:  Sedated, arousable, and answers simple questions and follows 1- step commands. HEENT:  Head normocephalic, atraumatic.  Pupils equal, round, and reactive to light.  Extraocular movements intact.  Fundi benign both eyes.  Oropharynx clear, nonerythematous. NECK:  Supple.  Trachea midline.  No adenopathy.  No carotid bruit or JVD. LUNGS:  Clear to auscultation bilaterally. RESPIRATIONS:  Unlabored.  No chest wall tenderness or deformity. HEART:  Regular rate and rhythm.  S1 and S2 normal.  No murmurs, gallops, or rubs. ABDOMEN:  Soft, nontender, nondistended.  Bowel sounds present in  all 4 quadrants. EXTREMITIES:  Atraumatic.  No cyanosis, clubbing or edema. SKIN:  Skin color, texture, turgor normal.  No rashes or lesions. Neurologic:  Drowsy, but arousable.  Cranial nerves appear intact. Moves all 4 extremities.  Slurred speech.  Unable to assess sensation, gait, or cerebellar signs.  ADMITTING LABORATORY DATA:  Sodium 134, potassium 3.5, chloride 102, bicarb 25, BUN 15, creatinine 0.83, glucose 135.  AST 26, ALT 17, alk phos 71, protein 10, albumin 3.3, ammonia less than 10.  WBC 3.7, hemoglobin 10.7, hematocrit 32.3, platelets 142.  HOSPITAL COURSE BY PROBLEM: 1. Altered mental status.  The patient presented with altered mental     status.  Initial lumbar puncture was positive for JC virus by PCR.     We were initially concerned for toxoplasmosis given the patient's     low CD4 count and lack of prophylaxis against toxoplasmosis.     However, brain MRI findings were not consistent with this and later     PCR results were negative for toxoplasmosis.  The patient was     treated with antibiotics for toxoplasmosis for about 3 days, but     these were discontinued after the PCR results returned.  Given the     patient's diffuse findings on MRI, the most likely cause of her     altered mental status is JC virus-induced immune reconstitution     syndrome, though we are also considering PNL.  The patient's mental     status gradually improved over hospitalization, and by discharge     she was becoming more oriented to time, and oriented to     person, but not oriented to place.  She also remains with      unstable gait, though her gait did also improve over the course of     hospitalization.  Her immune reconstitution syndrome was likely     triggered by the commencement of HAART therapy approximately 6     weeks prior to admission.  HAART therapy was continued during     hospitalization and the patient was also discharged on her home     regimen.  Her CD4 on  admission was 110. 2. HIV.  The patient's CD4 count on admission was found to be 110,     improvement over CD4 count of 70 seen about 2 months ago.  The     patient was continued on her antiretroviral therapy during     hospitalization and was discharged on the same regimen with the     addition of Bactrim for PCP prophylaxis.  The patient will follow     up with her Infectious Disease physician, Dr. Luciana Axe on September 03, 2011. 3. Depression.  The patient presented with feelings of sadness and  multiple family stressor issues, which she noted once her episode     of acute alteration in mental status started to improve.  She was     also noted to have a very tangential throughout process, though it     was unclear whether this represented a true mental state or was a     result of her alteration in her mental status.  The patient was     started on Symbyax to cover her depression with a concern for     possible psychotic features, though again the diagnosis is unclear     in the setting of acute altered mental status.  The patient     responded positively to treatment and this medication was continued     at discharge and can be managed on an outpatient basis. 4. Acute renal injury.  The patient presented with a creatinine of     0.8, but this rose to a peak of 1.25, likely prerenal due to     decreased p.o. intake, though also possibly contributed to by IV     contrast-induced ATN or medication-induced AIN.  Creatinine     improved during hospitalization and was 1.08 on the day of     discharge.  DISCHARGE VITAL SIGNS:  Blood pressure 124/80, temperature 98.1, pulse 78, respirations 18, oxygen saturation 96% on room air.  DISCHARGE LABORATORY DATA:  WBC 2.7, hemoglobin 10.4, hematocrit 30.9, platelets 180.  Creatinine 1.08.    ______________________________ Janalyn Harder, MD   ______________________________ Cliffton Asters, M.D.    RB/MEDQ  D:  08/20/2011  T:  08/20/2011   Job:  [id number]  cc:   Gardiner Barefoot, MD  Electronically Signed by Janalyn Harder MD on 08/27/2011 03:51:25 PM Electronically Signed by Cliffton Asters M.D. on 08/31/2011 03:20:11 PM

## 2011-09-01 ENCOUNTER — Ambulatory Visit (INDEPENDENT_AMBULATORY_CARE_PROVIDER_SITE_OTHER): Payer: 59 | Admitting: Internal Medicine

## 2011-09-01 ENCOUNTER — Encounter: Payer: Self-pay | Admitting: Internal Medicine

## 2011-09-01 VITALS — BP 100/68 | HR 85 | Temp 97.5°F | Ht 62.5 in | Wt 114.0 lb

## 2011-09-01 DIAGNOSIS — N179 Acute kidney failure, unspecified: Secondary | ICD-10-CM

## 2011-09-01 LAB — BASIC METABOLIC PANEL
BUN: 15 mg/dL (ref 6–23)
CO2: 28 mEq/L (ref 19–32)
Calcium: 9.6 mg/dL (ref 8.4–10.5)
Creat: 1.18 mg/dL — ABNORMAL HIGH (ref 0.50–1.10)
Glucose, Bld: 119 mg/dL — ABNORMAL HIGH (ref 70–99)
Sodium: 137 mEq/L (ref 135–145)

## 2011-09-01 NOTE — Progress Notes (Signed)
Subjective:   Patient ID: [name] female   DOB: 06-02-1971 40 y.o.   MRN: [id number]  HPI: Ms.[name] is a 40 y.o. woman is a new patient to our clinic presents for a hospital follow up. Please, refer to D/C Summary. Patient reports feeling "much better." Denies any urinary symptoms; denies any HA, confusion, visual changes, fever, chills, rashes, SOB or any other Sx. Patient is currently a resident at [facility] for rehab and requests to be discharge as of September 03, 2011.    No past medical history on file. Current Outpatient Prescriptions  Medication Sig Dispense Refill  . dapsone 100 MG tablet Take 1 tablet (100 mg total) by mouth daily.  30 tablet  0  . darunavir (PREZISTA) 400 MG tablet Take 2 tablets (800 mg total) by mouth daily.  60 tablet  5  . emtricitabine-tenofovir (TRUVADA) 200-300 MG per tablet Take 1 tablet by mouth daily.  30 tablet  5  . ENSURE (ENSURE) Take 1 Can by mouth 2 (two) times daily between meals.  237 mL  11  . ritonavir (NORVIR) 100 MG TABS Take 1 tablet (100 mg total) by mouth daily.  30 tablet  5   No family history on file. History   Social History  . Marital Status: Married    Spouse Name: N/A    Number of Children: N/A  . Years of Education: N/A   Social History Main Topics  . Smoking status: Never Smoker   . Smokeless tobacco: Never Used  . Alcohol Use: No  . Drug Use: No  . Sexually Active: Yes -- Female partner(s)     pt. given condoms   Other Topics Concern  . None   Social History Narrative  . None   Review of Systems: Constitutional: Denies fever, chills, diaphoresis, appetite change and fatigue.  HEENT: Denies photophobia, eye pain, redness, hearing loss, ear pain, congestion, sore throat, rhinorrhea, sneezing, mouth sores, trouble swallowing, neck pain, neck stiffness and tinnitus.   Respiratory: Denies SOB, DOE, cough, chest tightness,  and wheezing.   Cardiovascular: Denies chest pain, palpitations and leg swelling.    Gastrointestinal: Denies nausea, vomiting, abdominal pain, diarrhea, constipation, blood in stool and abdominal distention.  Genitourinary: Denies dysuria, urgency, frequency, hematuria, flank pain and difficulty urinating.  Musculoskeletal: Denies myalgias, back pain, joint swelling, arthralgias and gait problem.  Skin: Denies pallor, rash and wound.  Neurological: Denies dizziness, seizures, syncope, weakness, light-headedness, numbness and headaches.  Hematological: Denies adenopathy. Easy bruising, personal or family bleeding history  Psychiatric/Behavioral: Denies suicidal ideation, mood changes, confusion, nervousness, sleep disturbance and agitation  Objective:  Physical Exam: Filed Vitals:   09/01/11 0951  BP: 100/68  Pulse: 85  Temp: 97.5 F (36.4 C)  TempSrc: Oral  Height: 5' 2.5" (1.588 m)  Weight: 114 lb (51.71 kg)   Constitutional: Vital signs reviewed.  Patient is a well-developed and well-nourished , in no acute distress and cooperative with exam. Alert and oriented x3.  Head: Normocephalic and atraumatic Ear: TM normal bilaterally Mouth: no erythema or exudates, MMM Eyes: PERRL, EOMI, conjunctivae normal, No scleral icterus.  Neck: Supple, Trachea midline normal ROM, No JVD, mass, thyromegaly, or carotid bruit present.  Cardiovascular: RRR, S1 normal, S2 normal, no MRG, pulses symmetric and intact bilaterally Pulmonary/Chest: CTAB, no wheezes, rales, or rhonchi Abdominal: Soft. Non-tender, non-distended, bowel sounds are normal, no masses, organomegaly, or guarding present.  GU: no CVA tenderness Musculoskeletal: No joint deformities, erythema, or stiffness, ROM full and no  nontender Hematology: no cervical, inginal, or axillary adenopathy.  Neurological: A&O x3, Strenght is normal and symmetric bilaterally, cranial nerve II-XII are grossly intact, no focal motor deficit, sensory intact to light touch bilaterally.  Skin: Warm, dry and intact. No rash, cyanosis, or  clubbing.  Psychiatric: Normal mood and affect. speech and behavior is normal. Judgment and thought content normal. Cognition and memory are normal.   Assessment & Plan:   1. Hospital follow up for: A. Akute kidney injury -no urinary sx -repeat Bmet today B. AMS r/t JC-induced reconstitution syndrome -resolved C. HIV -will follow up with Dr. Luciana Axe on 09/03/11 at 9:45 am. -importance of a follow up was emphasized to the patient.

## 2011-09-01 NOTE — Progress Notes (Deleted)
  Subjective:    Patient ID: [name], female    DOB: November 04, 1970, 40 y.o.   MRN: [id number]  HPI    Review of Systems     Objective:   Physical Exam        Assessment & Plan:

## 2011-09-03 ENCOUNTER — Encounter: Payer: Self-pay | Admitting: Internal Medicine

## 2011-09-03 ENCOUNTER — Ambulatory Visit (INDEPENDENT_AMBULATORY_CARE_PROVIDER_SITE_OTHER): Payer: 59 | Admitting: Internal Medicine

## 2011-09-03 ENCOUNTER — Ambulatory Visit: Payer: 59 | Admitting: Internal Medicine

## 2011-09-03 VITALS — BP 109/74 | HR 90 | Temp 98.7°F | Ht 62.0 in | Wt 110.3 lb

## 2011-09-03 DIAGNOSIS — B20 Human immunodeficiency virus [HIV] disease: Secondary | ICD-10-CM | POA: Insufficient documentation

## 2011-09-03 NOTE — Patient Instructions (Signed)
Follow up in 2 months

## 2011-09-03 NOTE — Assessment & Plan Note (Signed)
I will check the patient's viral load and CD4 count today. She has been in therapy especially at the rehabilitation center. I think she would do very poorly outside of the setting of her rehabilitation or assisted living facility and I do hope that she is able to continue in a controlled setting to help her with her medications. I do find that she is unlikely to be able to take care of herself effectively to take her medications and to improve in particular with the recent diagnosis of JC virus. She said they'll continue with her current medications including the PCP prophylaxis as well as antiretrovirals. She did receive condoms to continue to use as well. Once her immunosuppressive physician approves I will follow her lipid panel U. Other screening preventive health measures

## 2011-09-03 NOTE — Progress Notes (Signed)
  Subjective:    Patient ID: [name], female    DOB: May 03, 1971, 39 y.o.   MRN: [id number]  HPI the patient comes in here for followup following her recent hospitalization for 2 doses into the hospital with confusion and oriented hospitalization she did have a positive JC virus from her CSF. Toxoplasma IgG was negative and therefore she was presumed to have JC virus associated illness. The patient has been sent to a nursing home and is here today for her nursing home. She continues on her regimen of boosted Prezista and Truvada.she unfortunately has a poor social situation with the husband does not help care for her and likely will need to return to him once her rehabilitation stay is over. Today though she does appear well and though she does not completely understand her diagnosis, she seems to understand that medications will help her greatly. She otherwise has had no rashes or significant weight loss, diarrhea, dysuria or other complaints.    Review of Systems  Constitutional: Positive for fatigue. Negative for fever, activity change, appetite change and unexpected weight change.  HENT: Negative for neck pain.   Eyes: Negative for discharge.  Respiratory: Negative for cough and shortness of breath.   Cardiovascular: Negative for chest pain and palpitations.  Gastrointestinal: Negative for abdominal pain, diarrhea, constipation and abdominal distention.  Genitourinary: Negative for dysuria, urgency and frequency.  Musculoskeletal: Negative for myalgias and arthralgias.  Skin: Negative for pallor and rash.  Neurological: Negative for headaches.  Hematological: Negative for adenopathy.  Psychiatric/Behavioral: Positive for confusion and dysphoric mood.       Objective:   Physical Exam  Constitutional: She is oriented to person, place, and time. She appears well-developed and well-nourished.  HENT:  Mouth/Throat: No oropharyngeal exudate.  Cardiovascular: Normal rate, regular rhythm and  normal heart sounds.  Exam reveals no gallop and no friction rub.   No murmur heard. Pulmonary/Chest: Effort normal and breath sounds normal. No respiratory distress. She has no wheezes.  Abdominal: Soft. Bowel sounds are normal. She exhibits no distension. There is no tenderness.  Lymphadenopathy:    She has no cervical adenopathy.  Neurological: She is alert and oriented to person, place, and time.  Skin: Skin is warm and dry. No rash noted. No erythema.  Psychiatric:       confused          Assessment & Plan:

## 2011-09-04 LAB — CBC WITH DIFFERENTIAL/PLATELET
Basophils Absolute: 0 10*3/uL (ref 0.0–0.1)
Eosinophils Relative: 6 % — ABNORMAL HIGH (ref 0–5)
HCT: 33.4 % — ABNORMAL LOW (ref 36.0–46.0)
Hemoglobin: 10.9 g/dL — ABNORMAL LOW (ref 12.0–15.0)
Lymphocytes Relative: 50 % — ABNORMAL HIGH (ref 12–46)
Lymphs Abs: 1.8 10*3/uL (ref 0.7–4.0)
MCV: 94.4 fL (ref 78.0–100.0)
Monocytes Absolute: 0.5 10*3/uL (ref 0.1–1.0)
Neutro Abs: 1 10*3/uL — ABNORMAL LOW (ref 1.7–7.7)
RBC: 3.54 MIL/uL — ABNORMAL LOW (ref 3.87–5.11)
RDW: 13.7 % (ref 11.5–15.5)
WBC: 3.6 10*3/uL — ABNORMAL LOW (ref 4.0–10.5)

## 2011-09-04 LAB — T-HELPER CELL (CD4) - (RCID CLINIC ONLY): CD4 % Helper T Cell: 7 % — ABNORMAL LOW (ref 33–55)

## 2011-09-04 LAB — COMPLETE METABOLIC PANEL WITH GFR
AST: 38 U/L — ABNORMAL HIGH (ref 0–37)
Alkaline Phosphatase: 90 U/L (ref 39–117)
BUN: 18 mg/dL (ref 6–23)
Creat: 1.19 mg/dL — ABNORMAL HIGH (ref 0.50–1.10)
GFR, Est Non African American: 58 mL/min — ABNORMAL LOW (ref >90)
Glucose, Bld: 113 mg/dL — ABNORMAL HIGH (ref 70–99)
Potassium: 4.4 mEq/L (ref 3.5–5.3)
Total Bilirubin: 0.3 mg/dL (ref 0.3–1.2)

## 2011-09-07 LAB — HIV-1 RNA QUANT-NO REFLEX-BLD: HIV 1 RNA Quant: 209 copies/mL — ABNORMAL HIGH (ref <20)

## 2011-09-16 ENCOUNTER — Ambulatory Visit: Payer: 59 | Admitting: Infectious Disease

## 2011-09-17 LAB — AFB CULTURE WITH SMEAR (NOT AT ARMC): Acid Fast Smear: NONE SEEN

## 2011-10-14 ENCOUNTER — Telehealth: Payer: Self-pay | Admitting: *Deleted

## 2011-10-14 ENCOUNTER — Other Ambulatory Visit: Payer: Self-pay | Admitting: *Deleted

## 2011-10-14 DIAGNOSIS — B20 Human immunodeficiency virus [HIV] disease: Secondary | ICD-10-CM

## 2011-10-14 MED ORDER — SULFAMETHOXAZOLE-TMP DS 800-160 MG PO TABS: 1.0000 | ORAL_TABLET | Freq: Every day | ORAL | Status: DC

## 2011-10-14 NOTE — Telephone Encounter (Signed)
Patient's medication record showed she was taking Bactrim and Dapsone was removed.  Per Dr. Ephriam Knuckles note from August she was suppose to be on Dapsone because she complained that Bactrim caused nausea. While patient was in SNF she was receiving the Bactrim.  Clinic is able to provide patient with samples of Dapsone.  Patients medication list has been updated. Wendall Mola CMA

## 2011-11-12 ENCOUNTER — Other Ambulatory Visit (INDEPENDENT_AMBULATORY_CARE_PROVIDER_SITE_OTHER): Payer: 59

## 2011-11-12 DIAGNOSIS — Z20828 Contact with and (suspected) exposure to other viral communicable diseases: Secondary | ICD-10-CM

## 2011-11-12 DIAGNOSIS — Z113 Encounter for screening for infections with a predominantly sexual mode of transmission: Secondary | ICD-10-CM

## 2011-11-12 DIAGNOSIS — B20 Human immunodeficiency virus [HIV] disease: Secondary | ICD-10-CM

## 2011-11-13 LAB — URINALYSIS, MICROSCOPIC ONLY
Bacteria, UA: NONE SEEN
Crystals: NONE SEEN
Squamous Epithelial / LPF: NONE SEEN

## 2011-11-13 LAB — URINALYSIS, ROUTINE W REFLEX MICROSCOPIC
Bilirubin Urine: NEGATIVE
Glucose, UA: NEGATIVE mg/dL
Ketones, ur: NEGATIVE mg/dL
Nitrite: NEGATIVE
Specific Gravity, Urine: 1.02 (ref 1.005–1.030)
pH: 6 (ref 5.0–8.0)

## 2011-11-13 LAB — COMPLETE METABOLIC PANEL WITH GFR
ALT: 12 U/L (ref 0–35)
AST: 24 U/L (ref 0–37)
Albumin: 4.2 g/dL (ref 3.5–5.2)
Alkaline Phosphatase: 75 U/L (ref 39–117)
BUN: 12 mg/dL (ref 6–23)
Chloride: 104 mEq/L (ref 96–112)
Creat: 0.86 mg/dL (ref 0.50–1.10)
Potassium: 4.1 mEq/L (ref 3.5–5.3)

## 2011-11-13 LAB — CBC WITH DIFFERENTIAL/PLATELET
Eosinophils Absolute: 0.7 10*3/uL (ref 0.0–0.7)
Eosinophils Relative: 18 % — ABNORMAL HIGH (ref 0–5)
HCT: 36.6 % (ref 36.0–46.0)
Lymphocytes Relative: 43 % (ref 12–46)
Lymphs Abs: 1.6 10*3/uL (ref 0.7–4.0)
MCH: 32.2 pg (ref 26.0–34.0)
MCV: 99.7 fL (ref 78.0–100.0)
Monocytes Absolute: 0.4 10*3/uL (ref 0.1–1.0)
RBC: 3.67 MIL/uL — ABNORMAL LOW (ref 3.87–5.11)
WBC: 3.7 10*3/uL — ABNORMAL LOW (ref 4.0–10.5)

## 2011-11-13 LAB — T-HELPER CELL (CD4) - (RCID CLINIC ONLY)
CD4 % Helper T Cell: 8 % — ABNORMAL LOW (ref 33–55)
CD4 T Cell Abs: 140 uL — ABNORMAL LOW (ref 400–2700)

## 2011-11-13 LAB — GC/CHLAMYDIA PROBE AMP, URINE: GC Probe Amp, Urine: NEGATIVE

## 2011-11-16 LAB — HIV-1 RNA QUANT-NO REFLEX-BLD
HIV 1 RNA Quant: <20 copies/mL (ref <20)
HIV-1 RNA Quant, Log: <1.3 {Log} (ref <1.30)

## 2011-11-19 ENCOUNTER — Other Ambulatory Visit: Payer: Self-pay | Admitting: Licensed Clinical Social Worker

## 2011-11-19 DIAGNOSIS — B20 Human immunodeficiency virus [HIV] disease: Secondary | ICD-10-CM

## 2011-11-19 MED ORDER — ENSURE PO LIQD: 1.0000 | Freq: Two times a day (BID) | ORAL | Status: DC

## 2011-11-26 ENCOUNTER — Other Ambulatory Visit: Payer: Self-pay | Admitting: *Deleted

## 2011-11-26 ENCOUNTER — Ambulatory Visit (INDEPENDENT_AMBULATORY_CARE_PROVIDER_SITE_OTHER): Payer: 59 | Admitting: Internal Medicine

## 2011-11-26 ENCOUNTER — Encounter: Payer: Self-pay | Admitting: Internal Medicine

## 2011-11-26 DIAGNOSIS — G479 Sleep disorder, unspecified: Secondary | ICD-10-CM

## 2011-11-26 DIAGNOSIS — B20 Human immunodeficiency virus [HIV] disease: Secondary | ICD-10-CM

## 2011-11-26 MED ORDER — TRAZODONE HCL 50 MG PO TABS: 50.0000 mg | ORAL_TABLET | Freq: Every day | ORAL | Status: DC

## 2011-11-26 NOTE — Assessment & Plan Note (Signed)
She essentially reports no sleep at night.  She does not sleep in the day either.  No caffeine.  She tells me she is thinking a lot.  I will try trazodone to help with sleep and depression.  I suspect the weakness and poor appetite is related sleep disturbance and depression.  I will check on her regarding the sleep in 2 weeks.  She can double the dose of Trazodone to 100mg  at night after a few days if 50 mg is not working.

## 2011-11-26 NOTE — Assessment & Plan Note (Signed)
She is doing very well and I encouraged her to continue.  She is able to name her HIV medicines and has no difficulty/side effects with them.  She will continue dapsone.  She will need to be scheduled for a PAP soon.

## 2011-11-26 NOTE — Progress Notes (Signed)
  Subjective:    Patient ID: [name], female    DOB: 10-Aug-1971, 41 y.o.   MRN: [id number]  HPI she comes in for 042 follow up.  She has been on boosted Prezista and Truvada and reports good compliance with the medication.  She is also on Dapsone and takes that as well.  She is unsure of her other medications related to depression and if she is taking them or not.  She tells me she has a lot of trouble sleeping which has been ongoing for months.  She goes to bed about 9 pm and is still awake when her husband returns at 4am.  She is increasingly irritable and tired, complains of weakness as well.  She feels she is not eating (though has gained weight).      Review of Systems  Constitutional: Positive for appetite change. Negative for fever, chills, activity change, fatigue and unexpected weight change.  HENT: Negative for sore throat and trouble swallowing.   Respiratory: Negative for cough, chest tightness and shortness of breath.   Cardiovascular: Negative for chest pain, palpitations and leg swelling.  Gastrointestinal: Negative for nausea, abdominal pain and diarrhea.  Genitourinary: Negative for urgency, menstrual problem and pelvic pain.  Musculoskeletal: Negative for myalgias and arthralgias.  Skin: Negative for pallor and rash.  Neurological: Positive for weakness. Negative for dizziness, tremors, light-headedness and headaches.  Hematological: Negative for adenopathy.  Psychiatric/Behavioral: Positive for sleep disturbance and dysphoric mood. Negative for confusion. The patient is not nervous/anxious.        Objective:   Physical Exam  Constitutional: She appears well-developed and well-nourished. No distress.  HENT:  Mouth/Throat: Oropharynx is clear and moist. No oropharyngeal exudate.  Cardiovascular: Normal rate, regular rhythm and normal heart sounds.  Exam reveals no gallop and no friction rub.   No murmur heard. Pulmonary/Chest: Effort normal and breath sounds normal. No  respiratory distress. She has no wheezes. She has no rales.  Abdominal: Soft. Bowel sounds are normal. She exhibits no distension. There is no tenderness. There is no rebound.  Skin: Skin is warm and dry. No rash noted. No erythema.  Psychiatric:       Flat affect but pleased with the news of undetectable virus.           Assessment & Plan:

## 2011-11-26 NOTE — Patient Instructions (Signed)
Take trazodone at night to help with sleep

## 2011-12-10 ENCOUNTER — Ambulatory Visit (INDEPENDENT_AMBULATORY_CARE_PROVIDER_SITE_OTHER): Payer: 59 | Admitting: Internal Medicine

## 2011-12-10 ENCOUNTER — Encounter: Payer: Self-pay | Admitting: Internal Medicine

## 2011-12-10 VITALS — BP 107/73 | HR 85 | Temp 98.2°F | Ht 62.0 in | Wt 127.0 lb

## 2011-12-10 DIAGNOSIS — G479 Sleep disorder, unspecified: Secondary | ICD-10-CM

## 2011-12-10 DIAGNOSIS — Z23 Encounter for immunization: Secondary | ICD-10-CM

## 2011-12-10 DIAGNOSIS — G478 Other sleep disorders: Secondary | ICD-10-CM

## 2011-12-10 DIAGNOSIS — B20 Human immunodeficiency virus [HIV] disease: Secondary | ICD-10-CM

## 2011-12-10 MED ORDER — OLANZAPINE-FLUOXETINE HCL 6-25 MG PO CAPS: 1.0000 | ORAL_CAPSULE | Freq: Every evening | ORAL | Status: DC

## 2011-12-10 NOTE — Progress Notes (Signed)
  Subjective:    Patient ID: [name], female    DOB: [birth year], 41 y.o.   MRN: [id number]  HPI She is here for follow up of her inability to sleep.  She was seen two weeks ago and is doing well with her HIV meds but has not been able to sleep.  She continues to be depressed but never got a refill of her Symbyax or Zoloft and so has been off of it.  She continues to struggle with depression.  Her other complaint is spotting that occurs with menstruation.  She has not had a normal menstrual period, only the monthly spotting, since she has been sick. She is not currently sexually active and has not been for > 6 months.  She also has no appetite and has only been drinking ensure daily, no food.     Review of Systems  Constitutional: Positive for activity change, appetite change, fatigue and unexpected weight change. Negative for fever, chills and diaphoresis.  HENT: Negative for sore throat and trouble swallowing.   Respiratory: Negative for cough, chest tightness and shortness of breath.   Cardiovascular: Negative for chest pain, palpitations and leg swelling.  Gastrointestinal: Positive for nausea and abdominal pain. Negative for vomiting, diarrhea and abdominal distention.  Genitourinary: Positive for menstrual problem. Negative for frequency, genital sores and pelvic pain.  Neurological: Negative for dizziness, tremors, weakness, numbness and headaches.  Hematological: Negative for adenopathy.  Psychiatric/Behavioral: Positive for dysphoric mood. Negative for sleep disturbance. The patient is not nervous/anxious.        Objective:   Physical Exam  Constitutional:       Thin, nad  Cardiovascular: Normal rate, regular rhythm and normal heart sounds.  Exam reveals no gallop and no friction rub.   No murmur heard. Pulmonary/Chest: Effort normal and breath sounds normal. No respiratory distress. She has no wheezes. She has no rales.  Abdominal: Soft. There is no tenderness.  Psychiatric:      Flat affect          Assessment & Plan:

## 2011-12-10 NOTE — Patient Instructions (Signed)
Start taking the Symbyax at night.

## 2011-12-10 NOTE — Assessment & Plan Note (Signed)
I am pleased that she has been able to sleep since starting the trazodone.  I do think overall she has improved with her depression likely related to sleeping.  I did discuss with her that I feel she will benefit from the depression medication and that she needs to see a psychiatrist.  She agreed to both.  I will start the Symbyax.  I will have her follow up in 1 months with labs the week before to check on her status.

## 2011-12-28 ENCOUNTER — Other Ambulatory Visit: Payer: Self-pay | Admitting: *Deleted

## 2011-12-28 DIAGNOSIS — B20 Human immunodeficiency virus [HIV] disease: Secondary | ICD-10-CM

## 2011-12-28 MED ORDER — RITONAVIR 100 MG PO TABS: 100.0000 mg | ORAL_TABLET | Freq: Every day | ORAL | Status: DC

## 2011-12-28 MED ORDER — DARUNAVIR ETHANOLATE 800 MG PO TABS: 800.0000 mg | ORAL_TABLET | Freq: Every day | ORAL | Status: DC

## 2011-12-28 MED ORDER — EMTRICITABINE-TENOFOVIR DF 200-300 MG PO TABS: 1.0000 | ORAL_TABLET | Freq: Every day | ORAL | Status: DC

## 2012-01-25 ENCOUNTER — Telehealth: Payer: Self-pay | Admitting: *Deleted

## 2012-01-25 NOTE — Telephone Encounter (Signed)
Pharmacist checked rxes.  Refills on file.  Dapsone rx has never been picked up.  RN and pharmacist compared pt telephone numbers.  Pharmacist to call pt about refills being ready for pick-up.  Will fill Dapsone for pt pick-up.

## 2012-02-03 ENCOUNTER — Other Ambulatory Visit: Payer: 59

## 2012-02-03 DIAGNOSIS — B20 Human immunodeficiency virus [HIV] disease: Secondary | ICD-10-CM

## 2012-02-03 LAB — COMPREHENSIVE METABOLIC PANEL
ALT: 9 U/L (ref 0–35)
CO2: 23 mEq/L (ref 19–32)
Calcium: 9.3 mg/dL (ref 8.4–10.5)
Chloride: 108 mEq/L (ref 96–112)
Creat: 0.92 mg/dL (ref 0.50–1.10)
Glucose, Bld: 99 mg/dL (ref 70–99)
Total Bilirubin: 0.3 mg/dL (ref 0.3–1.2)
Total Protein: 9 g/dL — ABNORMAL HIGH (ref 6.0–8.3)

## 2012-02-03 LAB — CBC WITH DIFFERENTIAL/PLATELET
Eosinophils Absolute: 0.4 10*3/uL (ref 0.0–0.7)
Eosinophils Relative: 11 % — ABNORMAL HIGH (ref 0–5)
HCT: 34 % — ABNORMAL LOW (ref 36.0–46.0)
Hemoglobin: 11 g/dL — ABNORMAL LOW (ref 12.0–15.0)
Lymphocytes Relative: 41 % (ref 12–46)
Lymphs Abs: 1.4 10*3/uL (ref 0.7–4.0)
MCH: 31.4 pg (ref 26.0–34.0)
MCV: 97.1 fL (ref 78.0–100.0)
Monocytes Relative: 10 % (ref 3–12)
RBC: 3.5 MIL/uL — ABNORMAL LOW (ref 3.87–5.11)
WBC: 3.4 10*3/uL — ABNORMAL LOW (ref 4.0–10.5)

## 2012-02-04 LAB — T-HELPER CELL (CD4) - (RCID CLINIC ONLY): CD4 T Cell Abs: 120 uL — ABNORMAL LOW (ref 400–2700)

## 2012-02-11 ENCOUNTER — Ambulatory Visit: Payer: 59 | Admitting: Internal Medicine

## 2012-02-18 ENCOUNTER — Ambulatory Visit (INDEPENDENT_AMBULATORY_CARE_PROVIDER_SITE_OTHER): Payer: 59 | Admitting: Internal Medicine

## 2012-02-18 ENCOUNTER — Encounter: Payer: Self-pay | Admitting: Internal Medicine

## 2012-02-18 VITALS — BP 126/75 | HR 100 | Temp 98.6°F | Ht 60.0 in | Wt 135.0 lb

## 2012-02-18 DIAGNOSIS — IMO0002 Reserved for concepts with insufficient information to code with codable children: Secondary | ICD-10-CM

## 2012-02-18 DIAGNOSIS — N898 Other specified noninflammatory disorders of vagina: Secondary | ICD-10-CM

## 2012-02-18 DIAGNOSIS — R87612 Low grade squamous intraepithelial lesion on cytologic smear of cervix (LGSIL): Secondary | ICD-10-CM

## 2012-02-18 DIAGNOSIS — B20 Human immunodeficiency virus [HIV] disease: Secondary | ICD-10-CM

## 2012-02-18 DIAGNOSIS — Z124 Encounter for screening for malignant neoplasm of cervix: Secondary | ICD-10-CM

## 2012-02-18 MED ORDER — TRAZODONE HCL 50 MG PO TABS: 50.0000 mg | ORAL_TABLET | Freq: Every day | ORAL | Status: DC

## 2012-02-18 NOTE — Progress Notes (Signed)
  Subjective:    Patient ID: [name], female    DOB: 07/14/1971, 41 y.o.   MRN: [id number]  HPI She comes in here for followup of her HIV and depression. She has been on a regimen of Norvir, Prezista and Truvada. She has had excellent compliance with the medication. She also was started on trazodone for sleep previously and tells me she has been sleeping very well and in fact her depression is improved and her mood is much better. She does complain of what she initially said was menstrual bleeding and pain but then described on abnormal discharge. She continues to take dapsone.   Review of Systems  Constitutional: Negative for fever, chills, fatigue and unexpected weight change.  HENT: Negative for sore throat and trouble swallowing.   Cardiovascular: Negative for chest pain, palpitations and leg swelling.  Gastrointestinal: Negative for nausea and abdominal pain.  Genitourinary: Positive for vaginal discharge, menstrual problem and pelvic pain. Negative for dysuria.  Musculoskeletal: Negative for myalgias, back pain and joint swelling.  Skin: Negative for rash.  Neurological: Negative for dizziness, weakness and headaches.  Hematological: Negative for adenopathy.  Psychiatric/Behavioral: Negative for sleep disturbance, dysphoric mood and decreased concentration.       Objective:   Physical Exam  Constitutional: She appears well-developed and well-nourished. No distress.  HENT:  Mouth/Throat: Oropharynx is clear and moist. No oropharyngeal exudate.  Cardiovascular: Normal rate, regular rhythm and normal heart sounds.  Exam reveals no gallop and no friction rub.   No murmur heard. Pulmonary/Chest: Effort normal and breath sounds normal. No respiratory distress. She has no wheezes. She has no rales.  Abdominal: Soft. Bowel sounds are normal. She exhibits no distension. There is no tenderness. There is no rebound.  Genitourinary:       + small vaginal opening (probable history of female  circumcision)  Lymphadenopathy:    She has no cervical adenopathy.  Skin: No rash noted.          Assessment & Plan:

## 2012-02-18 NOTE — Assessment & Plan Note (Signed)
This has improved with trazodone. This will be continued.

## 2012-02-18 NOTE — Assessment & Plan Note (Signed)
The patient did have a pelvic exam and Pap smear. I will await results and treat accordingly.

## 2012-02-18 NOTE — Patient Instructions (Signed)
Follow up with gynecology

## 2012-02-18 NOTE — Assessment & Plan Note (Signed)
She continues to do well with her HIV but unfortunately has had slow immune reconstitution. She will continue with her current regimen and be followed closely. She will continue with dapsone prophylaxis. She will return in 3 months. She is aware to use condoms with all sexual activity.

## 2012-02-23 NOTE — Progress Notes (Signed)
Addended by: Jennet Maduro D on: 02/23/2012 02:18 PM   Modules accepted: Orders

## 2012-02-25 ENCOUNTER — Telehealth: Payer: Self-pay | Admitting: *Deleted

## 2012-02-25 NOTE — Telephone Encounter (Signed)
RN spoke with pt.  Informed that PAP smear results were abnormal and the Dr. Luciana Axe is referring her to [facility] OP clinic for GYN follow-up.  Someone from [facility] will contact the pt when the appointment has been secured.  Pt verbalized understanding.

## 2012-02-29 ENCOUNTER — Encounter: Payer: Self-pay | Admitting: Family Medicine

## 2012-03-31 ENCOUNTER — Encounter: Payer: 59 | Admitting: Family Medicine

## 2012-04-01 ENCOUNTER — Encounter: Payer: Self-pay | Admitting: Internal Medicine

## 2012-04-01 ENCOUNTER — Other Ambulatory Visit: Payer: Self-pay | Admitting: *Deleted

## 2012-04-01 ENCOUNTER — Ambulatory Visit
Admission: RE | Admit: 2012-04-01 | Discharge: 2012-04-01 | Disposition: A | Discharge status: Home / Self Care | Payer: 59 | Source: Ambulatory Visit | Attending: Internal Medicine | Admitting: Internal Medicine

## 2012-04-01 ENCOUNTER — Ambulatory Visit (INDEPENDENT_AMBULATORY_CARE_PROVIDER_SITE_OTHER): Payer: 59 | Admitting: Internal Medicine

## 2012-04-01 VITALS — BP 100/66 | HR 68 | Temp 98.0°F | Wt 131.0 lb

## 2012-04-01 DIAGNOSIS — F329 Major depressive disorder, single episode, unspecified: Secondary | ICD-10-CM

## 2012-04-01 DIAGNOSIS — R140 Abdominal distension (gaseous): Secondary | ICD-10-CM | POA: Insufficient documentation

## 2012-04-01 DIAGNOSIS — R141 Gas pain: Secondary | ICD-10-CM

## 2012-04-01 DIAGNOSIS — J309 Allergic rhinitis, unspecified: Secondary | ICD-10-CM

## 2012-04-01 DIAGNOSIS — R52 Pain, unspecified: Secondary | ICD-10-CM

## 2012-04-01 DIAGNOSIS — F32A Depression, unspecified: Secondary | ICD-10-CM | POA: Insufficient documentation

## 2012-04-01 DIAGNOSIS — R05 Cough: Secondary | ICD-10-CM

## 2012-04-01 MED ORDER — CYCLOBENZAPRINE HCL 7.5 MG PO TABS: 7.5000 mg | ORAL_TABLET | Freq: Two times a day (BID) | ORAL | Status: AC | PRN

## 2012-04-01 MED ORDER — CYCLOBENZAPRINE HCL 7.5 MG PO TABS: 7.5000 mg | ORAL_TABLET | Freq: Two times a day (BID) | ORAL | Status: DC | PRN

## 2012-04-01 MED ORDER — CETIRIZINE HCL 10 MG PO TABS: 10.0000 mg | ORAL_TABLET | Freq: Every day | ORAL | Status: DC

## 2012-04-01 NOTE — Assessment & Plan Note (Signed)
She does have depression and does feel she has depression. I have started her on trazodone in the past and she is now sleeping very well and has much more energy. In fact today she is much more alert and interactive than she has been in the past. I will have her get plugged in with mental health so that she can continue to get better. The patient is agreeable to this.

## 2012-04-01 NOTE — Progress Notes (Signed)
  Subjective:    Patient ID: [name], female    DOB: [birth year], 41 y.o.   MRN: [id number]  HPI Comes in here for a work in visit. She is a well-controlled HIV patient here now with a complaint of a persistent cough for the last week as well as neck pain. For her cough, this is a dry cough, exacerbated by by laying down and mainly occurs at night when she is laying down. She does able to sleep and has been sleeping good. She has a subjective fever but no chills. No night sweats. She does not recall ever having anything similar in the past. She does have a history of seasonal allergies though she does not recall this. She also describes some vague symptoms of shortness of breath that is relieved by sitting up. In discussion about this with her, it does sound like she gets feelings of panic and sitting up and relaxing helps her get her breath back. Her neck pain is described as being bilateral around the shoulder blades on each side and down the sides of her back on each side. She says she has had this for some time. No spinous process tenderness in her cervical or thoracic region, no pain with neck flexion or stiffness.   Review of Systems  Constitutional: Positive for fever. Negative for chills, fatigue and unexpected weight change.  HENT: Negative for sore throat and trouble swallowing.   Respiratory: Positive for cough and shortness of breath. Negative for chest tightness, wheezing and stridor.   Cardiovascular: Negative for chest pain, palpitations and leg swelling.  Gastrointestinal:       She does get occasional bouts of bloating and fluctuates between diarrhea and constipation though at this time is okay.  Musculoskeletal: Negative for myalgias, back pain, joint swelling, arthralgias and gait problem.  Skin: Negative for rash.  Neurological: Negative for dizziness, light-headedness and headaches.  Hematological: Negative for adenopathy.  Psychiatric/Behavioral: Positive for dysphoric mood.  Negative for sleep disturbance. The patient is not nervous/anxious.        Objective:   Physical Exam  Constitutional: She appears well-developed and well-nourished. No distress.  Cardiovascular: Normal rate, regular rhythm and normal heart sounds.   No murmur heard. Pulmonary/Chest: Effort normal and breath sounds normal. No respiratory distress. She has no wheezes. She has no rales.  Abdominal: Soft. Bowel sounds are normal. She exhibits no distension. There is no tenderness. There is no rebound.  Musculoskeletal: Normal range of motion. She exhibits no edema and no tenderness.       nno tenderness of spinous process in the cervical or thoracic region Muscle spasm bilaterally in the upper back area and by the scapula  Lymphadenopathy:    She has no cervical adenopathy.  Psychiatric:       Flat affect but is much improved compared to previous          Assessment & Plan:

## 2012-04-01 NOTE — Assessment & Plan Note (Signed)
She does take her medicine well and I will continue to follow up with that next visit.

## 2012-04-01 NOTE — Assessment & Plan Note (Signed)
She does have symptoms of abdominal bloating and intermittent constipation and diarrhea. I do suspect irritable bowel syndrome. I suspect Mrs. Secondary to her depression. I will have her seen by mental health and see if she needs depression counseling or medication and followup with her about this in 2 weeks.

## 2012-04-01 NOTE — Assessment & Plan Note (Signed)
I do suspect this cough is consistent with allergic rhinitis and therefore I will start her on Zyrtec. This is if the chest x-ray is otherwise unrevealing. The symptoms though do suggest allergies. She will follow up in 2 weeks to see if she does have some improvement.

## 2012-04-07 ENCOUNTER — Ambulatory Visit (INDEPENDENT_AMBULATORY_CARE_PROVIDER_SITE_OTHER): Payer: 59 | Admitting: Physician Assistant

## 2012-04-07 ENCOUNTER — Encounter: Payer: Self-pay | Admitting: Physician Assistant

## 2012-04-07 ENCOUNTER — Other Ambulatory Visit (HOSPITAL_COMMUNITY)
Admission: RE | Admit: 2012-04-07 | Discharge: 2012-04-07 | Disposition: A | Discharge status: Home / Self Care | Payer: 59 | Source: Ambulatory Visit | Attending: Physician Assistant | Admitting: Physician Assistant

## 2012-04-07 VITALS — BP 111/75 | HR 85 | Temp 98.7°F | Ht 60.6 in | Wt 136.8 lb

## 2012-04-07 DIAGNOSIS — N870 Mild cervical dysplasia: Secondary | ICD-10-CM | POA: Insufficient documentation

## 2012-04-07 DIAGNOSIS — Z01812 Encounter for preprocedural laboratory examination: Secondary | ICD-10-CM

## 2012-04-07 DIAGNOSIS — IMO0002 Reserved for concepts with insufficient information to code with codable children: Secondary | ICD-10-CM

## 2012-04-07 DIAGNOSIS — R87612 Low grade squamous intraepithelial lesion on cytologic smear of cervix (LGSIL): Secondary | ICD-10-CM

## 2012-04-07 LAB — POCT PREGNANCY, URINE: Preg Test, Ur: NEGATIVE

## 2012-04-07 NOTE — Progress Notes (Signed)
Pt referred secondary to LSIL on pap, HIV +. Patient given informed consent, signed copy in the chart, time out was performed.  Placed in lithotomy position. Cervix viewed with speculum and colposcope after application of acetic acid.   Colposcopy adequate?  No Acetowhite lesions?No Punctation?NO Mosaicism?  nO Abnormal vasculature?  nO Biopsies?No ECC?ECC  COMMENTS:suspect endocervical lesion given NL colposcopy. Patient was given post procedure instructions.  She will return in 2 weeks for results.

## 2012-04-07 NOTE — Patient Instructions (Signed)
Colposcopy Care After Colposcopy is a procedure in which a special tool is used to magnify the surface of the cervix. A tissue sample (biopsy) may also be taken. This sample will be looked at for cervical cancer or other problems. After the test:  You may have some cramping.   Lie down for a few minutes if you feel lightheaded.    You may have some bleeding which should stop in a few days.  HOME CARE  Do not have sex or use tampons for 2 to 3 days or as told.   Only take medicine as told by your doctor.   Continue to take your birth control pills as usual.  Finding out the results of your test Ask when your test results will be ready. Make sure you get your test results. GET HELP RIGHT AWAY IF:  You are bleeding a lot or are passing blood clots.   You develop a fever of 102 F (38.9 C) or higher.   You have abnormal vaginal discharge.   You have cramps that do not go away with medicine.   You feel lightheaded, dizzy, or pass out (faint).  MAKE SURE YOU:   Understand these instructions.   Will watch your condition.   Will get help right away if you are not doing well or get worse.  Document Released: 04/06/2008 Document Revised: 10/08/2011 Document Reviewed: 04/06/2008 Exit[employer] Patient Information 2012 [employer]. 

## 2012-04-11 ENCOUNTER — Telehealth: Payer: Self-pay

## 2012-04-11 NOTE — Telephone Encounter (Signed)
Message copied by Faythe Casa on Mon Apr 11, 2012 11:50 AM ------      Message from: Maylon Cos E      Created: Mon Apr 11, 2012  8:43 AM       Please call patient with result of CIN1, as discussed at visit. Will repeat pap and testing for HPV in 12  Months.

## 2012-04-12 ENCOUNTER — Telehealth: Payer: Self-pay | Admitting: *Deleted

## 2012-04-12 NOTE — Telephone Encounter (Signed)
Message copied by Jill Side on Tue Apr 12, 2012 12:26 PM ------      Message from: Maylon Cos E      Created: Mon Apr 11, 2012  8:43 AM       Please call patient with result of CIN1, as discussed at visit. Will repeat pap and testing for HPV in 12  Months.

## 2012-04-13 ENCOUNTER — Telehealth: Payer: Self-pay

## 2012-04-13 NOTE — Telephone Encounter (Signed)
Message copied by Faythe Casa on Wed Apr 13, 2012  8:46 AM ------      Message from: Maylon Cos E      Created: Mon Apr 11, 2012  8:43 AM       Please call patient with result of CIN1, as discussed at visit. Will repeat pap and testing for HPV in 12  Months.

## 2012-04-14 ENCOUNTER — Ambulatory Visit (INDEPENDENT_AMBULATORY_CARE_PROVIDER_SITE_OTHER): Payer: 59 | Admitting: Internal Medicine

## 2012-04-14 ENCOUNTER — Encounter: Payer: Self-pay | Admitting: Internal Medicine

## 2012-04-14 ENCOUNTER — Telehealth: Payer: Self-pay | Admitting: *Deleted

## 2012-04-14 VITALS — BP 113/75 | HR 103 | Temp 98.3°F | Ht 61.0 in | Wt 134.0 lb

## 2012-04-14 DIAGNOSIS — B20 Human immunodeficiency virus [HIV] disease: Secondary | ICD-10-CM

## 2012-04-14 DIAGNOSIS — F329 Major depressive disorder, single episode, unspecified: Secondary | ICD-10-CM

## 2012-04-14 DIAGNOSIS — K219 Gastro-esophageal reflux disease without esophagitis: Secondary | ICD-10-CM

## 2012-04-14 LAB — CBC WITH DIFFERENTIAL/PLATELET
Basophils Absolute: 0 10*3/uL (ref 0.0–0.1)
Basophils Relative: 0 % (ref 0–1)
Eosinophils Absolute: 0.4 10*3/uL (ref 0.0–0.7)
Eosinophils Relative: 13 % — ABNORMAL HIGH (ref 0–5)
Lymphs Abs: 1.2 10*3/uL (ref 0.7–4.0)
MCH: 30.9 pg (ref 26.0–34.0)
MCHC: 32.1 g/dL (ref 30.0–36.0)
Neutrophils Relative %: 46 % (ref 43–77)
Platelets: 194 10*3/uL (ref 150–400)
RBC: 3.49 MIL/uL — ABNORMAL LOW (ref 3.87–5.11)
RDW: 14.3 % (ref 11.5–15.5)

## 2012-04-14 LAB — COMPLETE METABOLIC PANEL WITH GFR
ALT: 12 U/L (ref 0–35)
AST: 22 U/L (ref 0–37)
Alkaline Phosphatase: 85 U/L (ref 39–117)
Creat: 1.04 mg/dL (ref 0.50–1.10)
Sodium: 137 mEq/L (ref 135–145)
Total Bilirubin: 0.4 mg/dL (ref 0.3–1.2)
Total Protein: 8.7 g/dL — ABNORMAL HIGH (ref 6.0–8.3)

## 2012-04-14 MED ORDER — OMEPRAZOLE 20 MG PO CPDR: 20.0000 mg | DELAYED_RELEASE_CAPSULE | Freq: Every day | ORAL | Status: DC

## 2012-04-14 MED ORDER — DOCUSATE SODIUM 100 MG PO CAPS: 100.0000 mg | ORAL_CAPSULE | Freq: Two times a day (BID) | ORAL | Status: AC | PRN

## 2012-04-14 NOTE — Telephone Encounter (Signed)
Called patient home number with [facility] [phone] and left message we are calling with some information, please call clinic. Also called mobile number -heard message number disconnected

## 2012-04-14 NOTE — Patient Instructions (Signed)
Start taking prilosec

## 2012-04-14 NOTE — Telephone Encounter (Signed)
When pt is contacted re: results of Pap, she can be told that she does not need appt on 6/26 for results.  Notify Admin pool of cancellation once pt has been contacted.

## 2012-04-14 NOTE — Telephone Encounter (Signed)
Message copied by Gerome Apley on Thu Apr 14, 2012  8:20 AM ------      Message from: Maylon Cos E      Created: Mon Apr 11, 2012  8:43 AM       Please call patient with result of CIN1, as discussed at visit. Will repeat pap and testing for HPV in 12  Months.

## 2012-04-15 LAB — T-HELPER CELL (CD4) - (RCID CLINIC ONLY): CD4 % Helper T Cell: 9 % — ABNORMAL LOW (ref 33–55)

## 2012-04-16 DIAGNOSIS — K219 Gastro-esophageal reflux disease without esophagitis: Secondary | ICD-10-CM | POA: Insufficient documentation

## 2012-04-16 NOTE — Assessment & Plan Note (Signed)
Her symptoms to me sound more like reflux.  I will try a ppi.  Also, she will get medicine for constipation.  Other etiology could be IBS.  I will refer her to a PCP to help investigate.

## 2012-04-16 NOTE — Assessment & Plan Note (Signed)
Good compliance.  I will check her labs today.

## 2012-04-16 NOTE — Assessment & Plan Note (Addendum)
Persists but improved after sleeping better.  I will have her plugged in to see our counsellor and follow up if she may benefit from an SSRI.  No SI

## 2012-04-16 NOTE — Progress Notes (Signed)
  Subjective:    Patient ID: [name], female    DOB: Nov 28, 1970, 41 y.o.   MRN: [id number]  HPI Here for follow up of depression and abdominal pain.  Continues on boosted darunavir and Truvada and denies any missed doses.  She reports some constipation, bloating and some reflux, particularly after meals.  No weight loss.  Still sleeping well and depression persists, but improved overall.     Review of Systems  Constitutional: Negative for fever, chills and unexpected weight change.  HENT: Negative for sore throat and trouble swallowing.   Respiratory: Negative for cough and shortness of breath.   Cardiovascular: Negative for chest pain, palpitations and leg swelling.  Gastrointestinal: Positive for abdominal pain and constipation. Negative for nausea and vomiting.  Musculoskeletal: Negative for myalgias, joint swelling and arthralgias.  Skin: Negative for rash.  Neurological: Negative for dizziness, weakness and headaches.  Psychiatric/Behavioral: Positive for dysphoric mood. The patient is not nervous/anxious.        Objective:   Physical Exam  Constitutional: She appears well-developed and well-nourished. No distress.  HENT:  Mouth/Throat: Oropharynx is clear and moist. No oropharyngeal exudate.  Cardiovascular: Normal rate, regular rhythm and normal heart sounds.  Exam reveals no gallop and no friction rub.   No murmur heard. Pulmonary/Chest: Effort normal and breath sounds normal. No respiratory distress. She has no wheezes. She has no rales.  Abdominal: Soft. Bowel sounds are normal. She exhibits no distension. There is no tenderness.  Lymphadenopathy:    She has no cervical adenopathy.          Assessment & Plan:

## 2012-04-18 LAB — HIV-1 RNA QUANT-NO REFLEX-BLD: HIV 1 RNA Quant: 28 copies/mL — ABNORMAL HIGH (ref <20)

## 2012-04-20 ENCOUNTER — Encounter: Payer: 59 | Admitting: Internal Medicine

## 2012-04-20 NOTE — Telephone Encounter (Signed)
I called pt and left message that we are calling with results information. You will not need the appt on 6/26 if we can speak w/you prior to that time to inform you of your results.

## 2012-04-21 ENCOUNTER — Encounter: Payer: Self-pay | Admitting: Internal Medicine

## 2012-04-21 ENCOUNTER — Ambulatory Visit (INDEPENDENT_AMBULATORY_CARE_PROVIDER_SITE_OTHER): Payer: 59 | Admitting: Internal Medicine

## 2012-04-21 VITALS — BP 99/71 | HR 102 | Temp 99.4°F | Ht 62.5 in | Wt 132.6 lb

## 2012-04-21 DIAGNOSIS — Z23 Encounter for immunization: Secondary | ICD-10-CM

## 2012-04-21 DIAGNOSIS — F329 Major depressive disorder, single episode, unspecified: Secondary | ICD-10-CM

## 2012-04-21 MED ORDER — AMITRIPTYLINE HCL 50 MG PO TABS: 50.0000 mg | ORAL_TABLET | Freq: Every day | ORAL | Status: DC

## 2012-04-21 NOTE — Progress Notes (Signed)
  Subjective:    Patient ID: [name], female    DOB: 20-Jan-1971, 41 y.o.   MRN: [id number]  HPI Mr. Wisser is is a 41 year old female with past medical history most significant for HIV infection with most recent CD4 count of 90 and a viral load of 20 in June 2013.  She was seen a week ago by Dr. Luciana Axe in infectious disease clinic.  #1 patient is depressed, she does not have any energy, she feels tired all the time, has difficulty sleeping, complains of aches and pain all over the body especially in the neck area. She said that she was thinking about suicide 2 months ago but hasn't heard about it in last 2 months. She does not have any plans to end her life at this time. Patient was on trazodone as per the medication list but she is not taking that medication.  #2 patient has reflux disease and was started on Prilosec. Patient symptoms are much better.  #3 patient complaining of neck stiffness since last few days. She states that she is unable to move her neck without pain. Pain is sharp, nonradiating   Review of Systems  Constitutional: Negative for fever, activity change and appetite change.  HENT: Positive for neck pain and neck stiffness. Negative for sore throat.   Respiratory: Negative for cough and shortness of breath.   Cardiovascular: Negative for chest pain and leg swelling.  Gastrointestinal: Negative for nausea, abdominal pain, diarrhea, constipation and abdominal distention.  Genitourinary: Negative for frequency, hematuria and difficulty urinating.  Musculoskeletal: Positive for myalgias, back pain and arthralgias.  Neurological: Negative for dizziness and headaches.  Psychiatric/Behavioral: Positive for dysphoric mood and decreased concentration. Negative for suicidal ideas and behavioral problems. The patient is nervous/anxious.        Objective:   Physical Exam  Constitutional: She is oriented to person, place, and time. She appears well-developed and well-nourished.    HENT:  Head: Normocephalic and atraumatic.  Eyes: Conjunctivae and EOM are normal. Pupils are equal, round, and reactive to light. No scleral icterus.  Neck: Normal range of motion. Neck supple. No JVD present. No thyromegaly present.  Cardiovascular: Normal rate, regular rhythm, normal heart sounds and intact distal pulses.  Exam reveals no gallop and no friction rub.   No murmur heard. Pulmonary/Chest: Effort normal and breath sounds normal. No respiratory distress. She has no wheezes. She has no rales.  Abdominal: Soft. Bowel sounds are normal. She exhibits no distension and no mass. There is no tenderness. There is no rebound and no guarding.  Musculoskeletal: Normal range of motion. She exhibits tenderness. She exhibits no edema.       Patient's range of motion in the neck was intact although there was several spots of tenderness along the cervical spine, top of the shoulder and lower back.  Lymphadenopathy:    She has no cervical adenopathy.  Neurological: She is alert and oriented to person, place, and time.  Psychiatric:       Patient had a flat affect No suicidal or homicidal ideation          Assessment & Plan:

## 2012-04-21 NOTE — Assessment & Plan Note (Addendum)
Patient has major depression with no suicidal ideation at this time. I will start the patient on amitriptyline 50 mg at nighttime today. Follow up in one week and plan to increase the dose if well tolerated. Patient given appointment with our social worker for psychological support and information about support groups and phone number to call if she feels a threat to herself. Patient agrees with the plan and will followup in one week. I believe that patient's neck pain is related to depression and fibromyalgia. Amitriptyline should help with fibromyalgia related pain. Followup in one week.

## 2012-04-22 NOTE — Addendum Note (Signed)
Addended by: Lars Mage on: 04/22/2012 10:39 AM   Modules accepted: Level of Service

## 2012-04-25 ENCOUNTER — Telehealth: Payer: Self-pay | Admitting: Licensed Clinical Social Worker

## 2012-04-25 ENCOUNTER — Encounter: Payer: Self-pay | Admitting: Licensed Clinical Social Worker

## 2012-04-25 NOTE — Telephone Encounter (Signed)
[name] was referred to CSW for mental health referral.  CSW attempt to contact pt on both of the provided telephone numbers.  Both number are disconnected.  CSW will send a letter in the mail and provide information to [place], [employer] and [employer].

## 2012-04-28 ENCOUNTER — Ambulatory Visit (INDEPENDENT_AMBULATORY_CARE_PROVIDER_SITE_OTHER): Payer: 59 | Admitting: Family

## 2012-04-28 VITALS — BP 106/73 | HR 91 | Temp 98.8°F | Ht 61.0 in | Wt 134.7 lb

## 2012-04-28 DIAGNOSIS — N898 Other specified noninflammatory disorders of vagina: Secondary | ICD-10-CM

## 2012-04-28 DIAGNOSIS — IMO0002 Reserved for concepts with insufficient information to code with codable children: Secondary | ICD-10-CM

## 2012-04-28 DIAGNOSIS — R87612 Low grade squamous intraepithelial lesion on cytologic smear of cervix (LGSIL): Secondary | ICD-10-CM

## 2012-04-28 NOTE — Progress Notes (Signed)
  Subjective:    Patient ID: [name], female    DOB: 1970-12-29, 41 y.o.   MRN: [id number]  HPI Pt here for result for colposcopy.  Pt seen in clinic in April and diagnosed with LGSIL on pap smear.  Colposcopy completed 04/07/12 and LSIL found with ECC.  Pt also reports thin, white discharge for past year.  Denies vaginal itching or odor.     Review of Systems  Genitourinary: Positive for vaginal discharge (thin, white dc).  All other systems reviewed and are negative.       Objective:   Physical Exam  Constitutional: She is oriented to person, place, and time. She appears well-developed and well-nourished.  HENT:  Head: Normocephalic.  Neck: Normal range of motion. Neck supple.  Abdominal: Soft. There is no tenderness.  Genitourinary: Cervix exhibits no motion tenderness, no discharge and no friability. Vaginal discharge (white, thin discharge ) found.  Neurological: She is alert and oriented to person, place, and time.  Skin: Skin is warm and dry.          Assessment & Plan:  Abnormal Colposcopy - LSIL w/ECC Vaginal Discharge   Plan: Schedule repap in one year with co-testing Discussed with Rosal[facility] [place]/Consult with Dr. Penne Lash GC/CT and wet prep pending  [facility]

## 2012-04-29 LAB — GC/CHLAMYDIA PROBE AMP, GENITAL: Chlamydia, DNA Probe: NEGATIVE

## 2012-04-29 LAB — WET PREP, GENITAL
Trich, Wet Prep: NONE SEEN
Yeast Wet Prep HPF POC: NONE SEEN

## 2012-05-11 ENCOUNTER — Other Ambulatory Visit: Payer: Self-pay | Admitting: *Deleted

## 2012-05-19 ENCOUNTER — Other Ambulatory Visit: Payer: Self-pay | Admitting: Internal Medicine

## 2012-05-19 NOTE — Telephone Encounter (Signed)
Sent to front desk for appointment

## 2012-05-19 NOTE — Telephone Encounter (Signed)
Please have PCP assigned and schedule a follow-up appointment with PCP.

## 2012-06-07 ENCOUNTER — Other Ambulatory Visit: Payer: 59

## 2012-06-14 ENCOUNTER — Encounter: Payer: Self-pay | Admitting: Internal Medicine

## 2012-06-14 ENCOUNTER — Ambulatory Visit (INDEPENDENT_AMBULATORY_CARE_PROVIDER_SITE_OTHER): Payer: 59 | Admitting: Internal Medicine

## 2012-06-14 VITALS — BP 107/74 | HR 91 | Temp 97.9°F | Wt 130.5 lb

## 2012-06-14 DIAGNOSIS — R140 Abdominal distension (gaseous): Secondary | ICD-10-CM

## 2012-06-14 DIAGNOSIS — B20 Human immunodeficiency virus [HIV] disease: Secondary | ICD-10-CM

## 2012-06-14 DIAGNOSIS — R143 Flatulence: Secondary | ICD-10-CM

## 2012-06-14 DIAGNOSIS — R141 Gas pain: Secondary | ICD-10-CM

## 2012-06-14 DIAGNOSIS — F329 Major depressive disorder, single episode, unspecified: Secondary | ICD-10-CM

## 2012-06-14 NOTE — Patient Instructions (Signed)
Try Metamucil for constipation and increase how much water you take  Get the refill of the Elavil

## 2012-06-14 NOTE — Assessment & Plan Note (Signed)
She continues to have problems with this though did note improvement with Elavil. I did educate her that the medication is to be taken daily and is not a short-term medication but something to try on a daily basis. She should followup with her primary physician regarding this as well to see if any dose adjustments need to be made. She is going to refill her prescription today and restart it.

## 2012-06-14 NOTE — Assessment & Plan Note (Signed)
She continues to take her medicine well and will return in 2 months for routine followup.

## 2012-06-14 NOTE — Progress Notes (Signed)
  Subjective:    Patient ID: [name], female    DOB: Mar 24, 1971, 41 y.o.   MRN: [id number]  HPI She comes in here for followup of her recent issues with depression. She has been seen by her primary physician who started her on Elavil. She did tell him and also confirmed with me today that she had stopped taking the trazodone.  She does continue to have depression in difficulty sleeping though she did note a significant improvement when she was taking the Elavil. She tells me, she did not realize that she should take the Elavil permanently and stopped after the one month prescription ran out. For her constipation, she states she sometimes goes once a week and does get bloated and have abdominal pain. She also has associated back pain.   Review of Systems  Constitutional: Positive for fatigue. Negative for chills, appetite change and unexpected weight change.  HENT: Negative for sore throat and trouble swallowing.   Gastrointestinal: Positive for constipation.  Neurological: Negative for light-headedness.       Objective:   Physical Exam  Constitutional: She appears well-developed and well-nourished. No distress.  Cardiovascular: Normal rate, regular rhythm and normal heart sounds.  Exam reveals no gallop and no friction rub.   No murmur heard. Pulmonary/Chest: Effort normal and breath sounds normal. No respiratory distress. She has no wheezes. She has no rales.          Assessment & Plan:

## 2012-06-14 NOTE — Assessment & Plan Note (Signed)
She continues to have problems with abdominal pain and is likely related to significant constipation. I did discuss with her taking Metamucil and significantly increasing her water intake. She does endorse poor water intake and so will try this.

## 2012-06-16 ENCOUNTER — Ambulatory Visit: Payer: 59

## 2012-06-16 DIAGNOSIS — F4321 Adjustment disorder with depressed mood: Secondary | ICD-10-CM

## 2012-06-16 NOTE — Progress Notes (Unsigned)
I met with [name] today for the first time and tried to assess her for depression.  The language barrier created some challenges, but she was able to tell me that the main issue for her is the problems she is having with her 40 year old daughter.  Apparently, this daughter has been acting out for the past 2 - 3 years - getting in trouble for stealing from a clothing store, etc.  She recently wanted to leave with some strangers, but [name] told her she could not.  [name] and her husband gave the daughter a 7:00 pm curfew, but she sometimes shows up at 4:00 am.  [name] does report occasional crying spells, mainly after a confrontation with the daughter.  She denies any anxiety.  She says she is sleeping well and says her medication helps her with this.  She endorses problems with appetite.  She also reports being tired.  She said her main coping strategy at this time is to walk away from the daughter and be by herself where she prays and meditates.  She said this helps her stop worrying about it.  She is attending classes at [facility] [employer], but says she is only taking English classes to improve her speaking and writing.  When I offered her to return for another session she said she would think about it and would call me if she felt she needed to.  She asked if there was a medication she could take to help her with the depression and I agreed to discuss this with Dr. Luciana Axe.

## 2012-06-21 ENCOUNTER — Ambulatory Visit: Payer: 59 | Admitting: Internal Medicine

## 2012-06-27 ENCOUNTER — Encounter: Payer: 59 | Admitting: Internal Medicine

## 2012-08-04 ENCOUNTER — Other Ambulatory Visit (INDEPENDENT_AMBULATORY_CARE_PROVIDER_SITE_OTHER): Payer: 59

## 2012-08-04 DIAGNOSIS — R3915 Urgency of urination: Secondary | ICD-10-CM

## 2012-08-04 DIAGNOSIS — B20 Human immunodeficiency virus [HIV] disease: Secondary | ICD-10-CM

## 2012-08-04 NOTE — Addendum Note (Signed)
Addended by: Starleen Arms D on: 08/04/2012 03:32 PM   Modules accepted: Orders

## 2012-08-05 LAB — COMPREHENSIVE METABOLIC PANEL
Albumin: 3.9 g/dL (ref 3.5–5.2)
BUN: 12 mg/dL (ref 6–23)
Calcium: 9.2 mg/dL (ref 8.4–10.5)
Chloride: 110 mEq/L (ref 96–112)
Creat: 0.99 mg/dL (ref 0.50–1.10)
Glucose, Bld: 76 mg/dL (ref 70–99)
Potassium: 4 mEq/L (ref 3.5–5.3)

## 2012-08-05 LAB — CBC WITH DIFFERENTIAL/PLATELET
Basophils Absolute: 0 10*3/uL (ref 0.0–0.1)
Eosinophils Relative: 9 % — ABNORMAL HIGH (ref 0–5)
HCT: 29.2 % — ABNORMAL LOW (ref 36.0–46.0)
Hemoglobin: 9.7 g/dL — ABNORMAL LOW (ref 12.0–15.0)
Lymphocytes Relative: 54 % — ABNORMAL HIGH (ref 12–46)
MCHC: 33.2 g/dL (ref 30.0–36.0)
MCV: 95.4 fL (ref 78.0–100.0)
Monocytes Absolute: 0.3 10*3/uL (ref 0.1–1.0)
Monocytes Relative: 10 % (ref 3–12)
Neutro Abs: 0.8 10*3/uL — ABNORMAL LOW (ref 1.7–7.7)
RDW: 13 % (ref 11.5–15.5)

## 2012-08-05 LAB — PATHOLOGIST SMEAR REVIEW

## 2012-08-05 LAB — T-HELPER CELL (CD4) - (RCID CLINIC ONLY)
CD4 % Helper T Cell: 10 % — ABNORMAL LOW (ref 33–55)
CD4 T Cell Abs: 140 uL — ABNORMAL LOW (ref 400–2700)

## 2012-08-05 LAB — HIV-1 RNA QUANT-NO REFLEX-BLD: HIV-1 RNA Quant, Log: <1.3 {Log} (ref <1.30)

## 2012-08-08 ENCOUNTER — Other Ambulatory Visit: Payer: Self-pay | Admitting: Licensed Clinical Social Worker

## 2012-08-08 DIAGNOSIS — B20 Human immunodeficiency virus [HIV] disease: Secondary | ICD-10-CM

## 2012-08-08 MED ORDER — EMTRICITABINE-TENOFOVIR DF 200-300 MG PO TABS: 1.0000 | ORAL_TABLET | Freq: Every day | ORAL | Status: DC

## 2012-08-18 ENCOUNTER — Encounter: Payer: Self-pay | Admitting: Internal Medicine

## 2012-08-18 ENCOUNTER — Ambulatory Visit (INDEPENDENT_AMBULATORY_CARE_PROVIDER_SITE_OTHER): Payer: 59 | Admitting: Internal Medicine

## 2012-08-18 VITALS — BP 111/71 | HR 80 | Temp 98.2°F | Ht 63.0 in | Wt 131.0 lb

## 2012-08-18 DIAGNOSIS — Z23 Encounter for immunization: Secondary | ICD-10-CM

## 2012-08-18 DIAGNOSIS — B20 Human immunodeficiency virus [HIV] disease: Secondary | ICD-10-CM

## 2012-08-19 ENCOUNTER — Other Ambulatory Visit: Payer: Self-pay | Admitting: *Deleted

## 2012-08-19 DIAGNOSIS — B20 Human immunodeficiency virus [HIV] disease: Secondary | ICD-10-CM

## 2012-08-19 MED ORDER — RITONAVIR 100 MG PO TABS: 100.0000 mg | ORAL_TABLET | Freq: Every day | ORAL | Status: DC

## 2012-08-19 MED ORDER — EMTRICITABINE-TENOFOVIR DF 200-300 MG PO TABS: 1.0000 | ORAL_TABLET | Freq: Every day | ORAL | Status: DC

## 2012-08-19 MED ORDER — DARUNAVIR ETHANOLATE 800 MG PO TABS: 800.0000 mg | ORAL_TABLET | Freq: Every day | ORAL | Status: DC

## 2012-08-22 NOTE — Progress Notes (Signed)
  Subjective:    Patient ID: [name], female    DOB: [birth year], 41 y.o.   MRN: [id number]  HPI Here for follow up of HIV.  She continues on her daily regimen with Presista/norvir, Truvada.  She denies missed doses and CD4 now up to 140, viral load now finally undetectable.  She continues on dapsone prophylaxis.  She continues to struggle with depression and home stressors with her daughter.  She has seen counseling but is not interested in returning at this time. She also tells me that she is going to transfer care due to confidentiality concerns in the community and thanks me for the care she has received.     Review of Systems  Constitutional: Negative for fever, fatigue and unexpected weight change.  HENT: Negative for sore throat and trouble swallowing.   Respiratory: Negative for cough.   Cardiovascular: Negative for palpitations and leg swelling.  Gastrointestinal: Negative for nausea, abdominal pain and diarrhea.  Musculoskeletal: Negative for myalgias, joint swelling and arthralgias.  Neurological: Negative for dizziness and headaches.  Psychiatric/Behavioral: Positive for dysphoric mood. The patient is not nervous/anxious.        Objective:   Physical Exam  Constitutional: She appears well-developed and well-nourished. No distress.  Cardiovascular: Normal rate, regular rhythm and normal heart sounds.  Exam reveals no gallop and no friction rub.   No murmur heard. Pulmonary/Chest: Effort normal and breath sounds normal. No respiratory distress. She has no rales.  Lymphadenopathy:    She has no cervical adenopathy.          Assessment & Plan:

## 2012-08-22 NOTE — Assessment & Plan Note (Signed)
Despite the depression and other issues, she is doing very well on her ARVs, though slow immune reconstitution.  She did sign a medical release form and we will forward records when she is established somewhere.  She is going to return to her home country for a few months and to establish afterwards.

## 2012-09-05 ENCOUNTER — Other Ambulatory Visit: Payer: Self-pay | Admitting: *Deleted

## 2012-09-05 DIAGNOSIS — B20 Human immunodeficiency virus [HIV] disease: Secondary | ICD-10-CM

## 2012-09-06 MED ORDER — DAPSONE 100 MG PO TABS: 100.0000 mg | ORAL_TABLET | Freq: Every day | ORAL | Status: DC

## 2012-09-12 ENCOUNTER — Telehealth: Payer: Self-pay | Admitting: *Deleted

## 2012-09-12 DIAGNOSIS — K5900 Constipation, unspecified: Secondary | ICD-10-CM

## 2012-09-12 MED ORDER — DOCUSATE SODIUM 100 MG PO CAPS: 100.0000 mg | ORAL_CAPSULE | Freq: Two times a day (BID) | ORAL | Status: DC | PRN

## 2012-09-12 NOTE — Telephone Encounter (Signed)
Requested discontinued rx for docusate sodioum  MD please advise.

## 2012-09-12 NOTE — Addendum Note (Signed)
Addended by: Jennet Maduro D on: 09/12/2012 03:07 PM   Modules accepted: Orders

## 2012-09-12 NOTE — Telephone Encounter (Signed)
Ok to refill 

## 2012-09-21 ENCOUNTER — Ambulatory Visit (INDEPENDENT_AMBULATORY_CARE_PROVIDER_SITE_OTHER): Payer: 59 | Admitting: Internal Medicine

## 2012-09-21 ENCOUNTER — Encounter: Payer: Self-pay | Admitting: Internal Medicine

## 2012-09-21 VITALS — BP 105/74 | HR 72 | Temp 97.4°F | Wt 122.7 lb

## 2012-09-21 DIAGNOSIS — F329 Major depressive disorder, single episode, unspecified: Secondary | ICD-10-CM

## 2012-09-21 DIAGNOSIS — B20 Human immunodeficiency virus [HIV] disease: Secondary | ICD-10-CM

## 2012-09-21 DIAGNOSIS — K5900 Constipation, unspecified: Secondary | ICD-10-CM

## 2012-09-21 MED ORDER — DULOXETINE HCL 30 MG PO CPEP: ORAL_CAPSULE | ORAL | Status: DC

## 2012-09-21 MED ORDER — DOCUSATE SODIUM 100 MG PO CAPS: 100.0000 mg | ORAL_CAPSULE | Freq: Two times a day (BID) | ORAL | Status: DC | PRN

## 2012-09-21 NOTE — Progress Notes (Signed)
Subjective:   Patient ID: [name] female   DOB: 08-May-1971 41 y.o.   MRN: [id number]  HPI: 41 year old woman with past medical history significant for HIV but a CD4 count of 120 and VL less than 20 copies per mL in October 2013, depression comes to the clinic for followup on her symptoms of depression.  She was last seen in clinic in June 2013 and was started on amitriptyline for her depression and fibromyalgia with the advise to followup in 1 month but patient comes in today stating that she did not know when to followup. She took her amitriptyline just for 1 month Continues to feel depressed lately. She reports having loss of appetite with bitter taste in her mouth. She feels fatigued, has pain all over the body. She prefers to stay in the bed almost all the time. She is dealing with a lot of stress related to her daughter. Husband s is planning to arrange a trip for the patient to visit her family back home in 2 months( by Jan 2014). Patient and her husband asked if she can be started back on Symbyax ( olanzapine- fluoxetine) that was prescribed to her when she was hospitalized for confusion and some memory issues.  She also reports abdominal pain usually left sided radiating to the back related to her menstrual cycle. She also reports having constipation. Denies any blood in stools, nausea or vomiting.     Past Medical History  Diagnosis Date  . Abnormal Pap smear   . Depression    No family history on file. History   Social History  . Marital Status: Married    Spouse Name: N/A    Number of Children: N/A  . Years of Education: N/A   Occupational History  . Not on file.   Social History Main Topics  . Smoking status: Never Smoker   . Smokeless tobacco: Never Used  . Alcohol Use: No  . Drug Use: No  . Sexually Active: Yes -- Female partner(s)     Comment: pt. given condoms   Other Topics Concern  . Not on file   Social History Narrative  . No narrative on file   Review  of Systems: General: Denies fever, chills, diaphoresis, appetite change and fatigue. HEENT: Denies photophobia, eye pain, redness, hearing loss, ear pain, congestion, sore throat, rhinorrhea, sneezing, mouth sores, trouble swallowing, neck pain, neck stiffness and tinnitus. Respiratory: Denies SOB, DOE, cough, chest tightness, and wheezing. Cardiovascular: Denies to chest pain, palpitations and leg swelling. Gastrointestinal: Denies nausea, vomiting, , diarrhea, constipation, blood in stool and abdominal distention, +abdominal pain Genitourinary: Denies dysuria, urgency, frequency, hematuria, flank pain and difficulty urinating. Musculoskeletal: + myalgias, + back pain, denies joint swelling, arthralgias and gait problem.  Skin: Denies pallor, rash and wound. Neurological: Denies dizziness, seizures, syncope, weakness, light-headedness, numbness and headaches. Hematological: Denies adenopathy, easy bruising, personal or family bleeding history. Psychiatric/Behavioral:+ dysphoric mood ,  Denies suicidal ideation, mood changes, confusion, nervousness, sleep disturbance and agitation.    Current Outpatient Medications: Current Outpatient Prescriptions  Medication Sig Dispense Refill  . amitriptyline (ELAVIL) 50 MG tablet TAKE 1 TABLET BY MOUTH AT BEDTIME  30 tablet  0  . cetirizine (ZYRTEC) 10 MG tablet Take 1 tablet (10 mg total) by mouth daily.  30 tablet  2  . dapsone 100 MG tablet Take 1 tablet (100 mg total) by mouth daily.  30 tablet  6  . Darunavir Ethanolate (PREZISTA) 800 MG tablet Take 1 tablet (800 mg  total) by mouth daily.  90 tablet  4  . docusate sodium (COLACE) 100 MG capsule Take 1 capsule (100 mg total) by mouth 2 (two) times daily as needed. For constipation.  60 capsule  prn  . emtricitabine-tenofovir (TRUVADA) 200-300 MG per tablet Take 1 tablet by mouth daily.  90 tablet  4  . ENSURE (ENSURE) Take 1 Can by mouth 2 (two) times daily between meals.  237 mL  11  . omeprazole  (PRILOSEC) 20 MG capsule Take 1 capsule (20 mg total) by mouth daily.  30 capsule  11  . ritonavir (NORVIR) 100 MG TABS Take 1 tablet (100 mg total) by mouth daily.  90 tablet  4    Allergies: Allergies  Allergen Reactions  . Septra (Sulfamethoxazole-Tmp Ds) Rash      Objective:   Physical Exam: Filed Vitals:   09/21/12 0822  BP: 105/74  Pulse: 72  Temp: 97.4 F (36.3 C)    General: Vital signs reviewed and noted. Well-developed, well-nourished, in no acute distress; alert, appropriate and cooperative throughout examination. Head: Normocephalic, atraumatic Lungs: Normal respiratory effort. Clear to auscultation BL without crackles or wheezes. Heart: RRR. S1 and S2 normal without gallop, murmur, or rubs. Abdomen:BS normoactive. Soft, Nondistended, non-tender.  No masses or organomegaly. Extremities: No pretibial edema.     Assessment & Plan:

## 2012-09-21 NOTE — Assessment & Plan Note (Signed)
She continues to have problems with depression. Denies any suicidal thoughts or ideation. She also was found to have tender points on exam consistent with a diagnosis of fibromyalgia. She did not take amitriptyline  as directed and couldn't really tell if it made any difference . Would start total Cymbalta for depression and fibromyalgia. She was educated that medication might take 6-8 weeks to take its full effect. -Start her on 30 mg of Cymbalta for a week and then go up to 60 mg every day. -Would call the patient in 2 weeks to inquire if the medication has started to help any. -Re schedule a followup appointment in 1-2 months. -She was psychotherapy but she refused

## 2012-09-21 NOTE — Assessment & Plan Note (Signed)
She was seen by Dr. Luciana Axe about a month ago. Her CD4 count is slowly improving and her viral load is undetectable from October of 2013. Continue current regimen

## 2012-09-21 NOTE — Patient Instructions (Addendum)
Please schedule a follow up appointment in 1-2 months to follow up on depressive symptoms. Please bring your medication bottles with your next appointment. Please take your medicines as prescribed. Please call us if you have experience any side effects from new medication.

## 2012-09-22 ENCOUNTER — Other Ambulatory Visit: Payer: Self-pay | Admitting: *Deleted

## 2012-09-22 DIAGNOSIS — B20 Human immunodeficiency virus [HIV] disease: Secondary | ICD-10-CM

## 2012-09-22 MED ORDER — ENSURE PO LIQD: 1.0000 | Freq: Two times a day (BID) | ORAL | Status: DC

## 2012-11-08 ENCOUNTER — Other Ambulatory Visit: Payer: 59

## 2012-11-08 DIAGNOSIS — B20 Human immunodeficiency virus [HIV] disease: Secondary | ICD-10-CM

## 2012-11-08 LAB — CBC WITH DIFFERENTIAL/PLATELET
Basophils Absolute: 0 10*3/uL (ref 0.0–0.1)
Basophils Relative: 1 % (ref 0–1)
Eosinophils Absolute: 0.5 10*3/uL (ref 0.0–0.7)
Eosinophils Relative: 15 % — ABNORMAL HIGH (ref 0–5)
Lymphs Abs: 1.5 10*3/uL (ref 0.7–4.0)
MCH: 32.6 pg (ref 26.0–34.0)
MCHC: 33.7 g/dL (ref 30.0–36.0)
MCV: 96.7 fL (ref 78.0–100.0)
Neutrophils Relative %: 32 % — ABNORMAL LOW (ref 43–77)
Platelets: 212 10*3/uL (ref 150–400)
RDW: 13.6 % (ref 11.5–15.5)

## 2012-11-08 LAB — COMPLETE METABOLIC PANEL WITH GFR
ALT: 11 U/L (ref 0–35)
Alkaline Phosphatase: 70 U/L (ref 39–117)
CO2: 26 mEq/L (ref 19–32)
Creat: 0.98 mg/dL (ref 0.50–1.10)
GFR, Est African American: 83 mL/min
Total Bilirubin: 0.3 mg/dL (ref 0.3–1.2)

## 2012-11-09 LAB — T-HELPER CELL (CD4) - (RCID CLINIC ONLY): CD4 % Helper T Cell: 10 % — ABNORMAL LOW (ref 33–55)

## 2012-11-09 LAB — HIV-1 RNA QUANT-NO REFLEX-BLD: HIV 1 RNA Quant: 315 copies/mL — ABNORMAL HIGH (ref <20)

## 2012-11-22 ENCOUNTER — Encounter: Payer: Self-pay | Admitting: Internal Medicine

## 2012-11-22 ENCOUNTER — Ambulatory Visit (INDEPENDENT_AMBULATORY_CARE_PROVIDER_SITE_OTHER): Payer: 59 | Admitting: Internal Medicine

## 2012-11-22 VITALS — BP 117/72 | HR 71 | Temp 98.3°F | Ht 63.0 in | Wt 122.0 lb

## 2012-11-22 DIAGNOSIS — F329 Major depressive disorder, single episode, unspecified: Secondary | ICD-10-CM

## 2012-11-22 DIAGNOSIS — G479 Sleep disorder, unspecified: Secondary | ICD-10-CM

## 2012-11-22 DIAGNOSIS — F32A Depression, unspecified: Secondary | ICD-10-CM

## 2012-11-22 DIAGNOSIS — K5900 Constipation, unspecified: Secondary | ICD-10-CM

## 2012-11-22 DIAGNOSIS — F3289 Other specified depressive episodes: Secondary | ICD-10-CM

## 2012-11-22 DIAGNOSIS — B20 Human immunodeficiency virus [HIV] disease: Secondary | ICD-10-CM

## 2012-11-22 DIAGNOSIS — R51 Headache: Secondary | ICD-10-CM

## 2012-11-22 MED ORDER — DOCUSATE SODIUM 100 MG PO CAPS: 100.0000 mg | ORAL_CAPSULE | Freq: Two times a day (BID) | ORAL | Status: DC | PRN

## 2012-11-22 MED ORDER — RITONAVIR 100 MG PO TABS: 100.0000 mg | ORAL_TABLET | Freq: Every day | ORAL | Status: DC

## 2012-11-22 MED ORDER — DARUNAVIR ETHANOLATE 800 MG PO TABS: 800.0000 mg | ORAL_TABLET | Freq: Every day | ORAL | Status: DC

## 2012-11-22 MED ORDER — DAPSONE 100 MG PO TABS: 100.0000 mg | ORAL_TABLET | Freq: Every day | ORAL | Status: DC

## 2012-11-22 MED ORDER — MIRTAZAPINE 15 MG PO TABS: 15.0000 mg | ORAL_TABLET | Freq: Every day | ORAL | Status: DC

## 2012-11-22 MED ORDER — EMTRICITABINE-TENOFOVIR DF 200-300 MG PO TABS: 1.0000 | ORAL_TABLET | Freq: Every day | ORAL | Status: DC

## 2012-11-22 NOTE — Progress Notes (Signed)
  Subjective:    Patient ID: [name], female    DOB: [birth year], 42 y.o.   MRN: [id number]  HPI She comes in here for followup of her HIV. She continues to take Prezista, Norvir and Truvada. Unfortunately, in December she decided to stop her medications because of this what she thought was side effects. She describes some vague sleepiness after taking the medications and feeling of "drunk". She also had been taking trazodone. She also tells me that she is planning to go back to [place] for about 6 months. She does not know when she is going to go. She continues to have problems with constipation, sleep and depression.   Review of Systems  Constitutional: Negative for fever, appetite change and fatigue.  HENT: Negative for sore throat and trouble swallowing.   Respiratory: Negative for cough and shortness of breath.   Cardiovascular: Negative for chest pain, palpitations and leg swelling.  Gastrointestinal: Negative for nausea, abdominal pain and diarrhea.  Neurological: Negative for dizziness and headaches.  Psychiatric/Behavioral: Positive for sleep disturbance and dysphoric mood. Negative for suicidal ideas.       Objective:   Physical Exam  Constitutional: She appears well-developed and well-nourished. No distress.  Cardiovascular: Normal rate, regular rhythm and normal heart sounds.  Exam reveals no gallop and no friction rub.   No murmur heard. Pulmonary/Chest: Effort normal and breath sounds normal. No respiratory distress. She has no wheezes.  Abdominal: Soft. Bowel sounds are normal. She exhibits no distension. There is no tenderness.          Assessment & Plan:

## 2012-11-22 NOTE — Assessment & Plan Note (Addendum)
She is back on her medications and I will recheck her virus in about one month. I will see her then 2 weeks later.  I discussed the problems with stopping medication and the possibility of resistance. More than 40 minutes spent with face to face contact, counseling and coordination of care.

## 2012-11-22 NOTE — Assessment & Plan Note (Signed)
She does continue to complain of a headache however it is relieved by over-the-counter medicines. No concerning signs or

## 2012-11-22 NOTE — Assessment & Plan Note (Signed)
He needs to be depressed however is not interested in following up with a counselor

## 2012-11-22 NOTE — Assessment & Plan Note (Signed)
She continues to have problems with sleep. I suspect her side effects are secondary to trazodone. I therefore will try Remeron.

## 2012-11-22 NOTE — Progress Notes (Signed)
HPI: [name] is a 42 y.o. female with HIV who is here for he routine visit.  Allergies: Allergies  Allergen Reactions  . Septra (Sulfamethoxazole-Tmp Ds) Rash    Vitals: Temp: 98.3 F (36.8 C) (01/21 1102) Temp src: Oral (01/21 1102) BP: 117/72 mmHg (01/21 1102) Pulse Rate: 71  (01/21 1102)  Past Medical History: Past Medical History  Diagnosis Date  . Abnormal Pap smear   . Depression     Social History: History   Social History  . Marital Status: Married    Spouse Name: N/A    Number of Children: N/A  . Years of Education: N/A   Social History Main Topics  . Smoking status: Never Smoker   . Smokeless tobacco: Never Used  . Alcohol Use: No  . Drug Use: No  . Sexually Active: Yes -- Female partner(s)     Comment: pt. given condoms   Other Topics Concern  . None   Social History Narrative  . None    Home Medications:  (Not in a hospital admission)  Current Regimen: DRV/r + TDF/FTC  Labs: HIV 1 RNA Quant (copies/mL)  Date Value  11/08/2012 315*  08/04/2012 <20   04/14/2012 28*     CD4 T Cell Abs (cmm)  Date Value  11/08/2012 140*  08/04/2012 140*  04/14/2012 90*     Hep B S Ab (no units)  Date Value  06/05/2011 NEG      Hepatitis B Surface Ag (no units)  Date Value  06/05/2011 NEGATIVE      HCV Ab (no units)  Date Value  06/05/2011 NEGATIVE     CrCl: Estimated Creatinine Clearance: 63.1 ml/min (by C-G formula based on Cr of 0.98).  Lipids:    Component Value Date/Time   CHOL 170 06/05/2011 1137   TRIG 80 06/05/2011 1137   HDL 35* 06/05/2011 1137   CHOLHDL 4.9 06/05/2011 1137   VLDL 16 06/05/2011 1137   LDLCALC 119* 06/05/2011 1137    Assessment: 42 yo with HIV who is supposed to be on the above regimen. I talked to her about whether she has had any issue taking her meds. She stated that she feels that the ARTs made her feel drunk. Apparently, the only meds she is taking is ART and dapsone. I really don't think the ARTs are the issue here. She has  a lot of depression issues. She was taking trazodone but no longer taking due to sedation. I noticed a bump in her VL since the last visit. I questioned her about compliance. She admitted that she missed one month of ARTs (dec) but she started taking them again about a week ago. She still has issue with sleeping. I recommend consider changing her antidepressant to Remeron to help with sleeping as well as treating her depression.   Recommendations: Cont DRV/r + Truvada Start Remeron 15 mg qday  Clide Cliff, PharmD Clinical Infectious Disease Pharmacist [facility] 11/22/2012, 1:39 PM

## 2012-11-22 NOTE — Assessment & Plan Note (Signed)
She does continue to have problems with constipation. She will restart the colace

## 2012-11-23 ENCOUNTER — Encounter: Payer: Self-pay | Admitting: Obstetrics & Gynecology

## 2012-12-15 ENCOUNTER — Encounter: Payer: 59 | Admitting: Obstetrics & Gynecology

## 2012-12-28 ENCOUNTER — Other Ambulatory Visit (INDEPENDENT_AMBULATORY_CARE_PROVIDER_SITE_OTHER): Payer: 59

## 2012-12-28 DIAGNOSIS — B20 Human immunodeficiency virus [HIV] disease: Secondary | ICD-10-CM

## 2012-12-29 ENCOUNTER — Ambulatory Visit (INDEPENDENT_AMBULATORY_CARE_PROVIDER_SITE_OTHER): Payer: 59 | Admitting: Obstetrics & Gynecology

## 2012-12-29 ENCOUNTER — Encounter: Payer: Self-pay | Admitting: Obstetrics & Gynecology

## 2012-12-29 VITALS — BP 108/69 | HR 79 | Temp 97.2°F | Ht 62.0 in | Wt 123.9 lb

## 2012-12-29 DIAGNOSIS — N946 Dysmenorrhea, unspecified: Secondary | ICD-10-CM | POA: Insufficient documentation

## 2012-12-29 DIAGNOSIS — N898 Other specified noninflammatory disorders of vagina: Secondary | ICD-10-CM

## 2012-12-29 LAB — T-HELPER CELL (CD4) - (RCID CLINIC ONLY): CD4 % Helper T Cell: 10 % — ABNORMAL LOW (ref 33–55)

## 2012-12-29 NOTE — Patient Instructions (Signed)
Pelvic Pain Pelvic pain is pain below the belly button and located between your hips. Acute pain may last a few hours or days. Chronic pelvic pain may last weeks and months. The cause may be different for different types of pain. The pain may be dull or sharp, mild or severe and can interfere with your daily activities. Write down and tell your caregiver:   Exactly where the pain is located.  If it comes and goes or is there all the time.  When it happens (with sex, urination, bowel movement, etc.)  If the pain is related to your menstrual period or stress. Your caregiver will take a full history and do a complete physical exam and Pap test. CAUSES   Painful menstrual periods (dysmenorrhea).  Normal ovulation (Mittelschmertz) that occurs in the middle of the menstrual cycle every month.  The pelvic organs get engorged with blood just before the menstrual period (pelvic congestive syndrome).  Scar tissue from an infection or past surgery (pelvic adhesions).  Cancer of the female pelvic organs. When there is pain with cancer, it has been there for a long time.  The lining of the uterus (endometrium) abnormally grows in places like the pelvis and on the pelvic organs (endometriosis).  A form of endometriosis with the lining of the uterus present inside of the muscle tissue of the uterus (adenomyosis).  Fibroid tumor (noncancerous) in the uterus.  Bladder problems such as infection, bladder spasms of the muscle tissue of the bladder.  Intestinal problems (irritable bowel syndrome, colitis, an ulcer or gastrointestinal infection).  Polyps of the cervix or uterus.  Pregnancy in the tube (ectopic pregnancy).  The opening of the cervix is too small for the menstrual blood to flow through it (cervical stenosis).  Physical or sexual abuse (past or present).  Musculo-skeletal problems from poor posture, problems with the vertebrae of the lower back or the uterine pelvic muscles falling  (prolapse).  Psychological problems such as depression or stress.  IUD (intrauterine device) in the uterus. DIAGNOSIS  Tests to make a diagnosis depends on the type, location, severity and what causes the pain to occur. Tests that may be needed include:  Blood tests.  Urine tests  Ultrasound.  X-rays.  CT Scan.  MRI.  Laparoscopy.  Major surgery. TREATMENT  Treatment will depend on the cause of the pain, which includes:  Prescription or over-the-counter pain medication.  Antibiotics.  Birth control pills.  Hormone treatment.  Nerve blocking injections.  Physical therapy.  Antidepressants.  Counseling with a psychiatrist or psychologist.  Minor or major surgery. HOME CARE INSTRUCTIONS   Only take over-the-counter or prescription medicines for pain, discomfort or fever as directed by your caregiver.  Follow your caregiver's advice to treat your pain.  Rest.  Avoid sexual intercourse if it causes the pain.  Apply warm or cold compresses (which ever works best) to the pain area.  Do relaxation exercises such as yoga or meditation.  Try acupuncture.  Avoid stressful situations.  Try group therapy.  If the pain is because of a stomach/intestinal upset, drink clear liquids, eat a bland light food diet until the symptoms go away. SEEK MEDICAL CARE IF:   You need stronger prescription pain medication.  You develop pain with sexual intercourse.  You have pain with urination.  You develop a temperature of 102 F (38.9 C) with the pain.  You are still in pain after 4 hours of taking prescription medication for the pain.  You need depression medication.    Your IUD is causing pain and you want it removed. SEEK IMMEDIATE MEDICAL CARE IF:  You develop very severe pain or tenderness.  You faint, have chills, severe weakness or dehydration.  You develop heavy vaginal bleeding or passing solid tissue.  You develop a temperature of 102 F (38.9 C)  with the pain.  You have blood in the urine.  You are being physically or sexually abused.  You have uncontrolled vomiting and diarrhea.  You are depressed and afraid of harming yourself or someone else. Document Released: 11/26/2004 Document Revised: 01/11/2012 Document Reviewed: 08/23/2008 Exit[employer] Patient Information 2013 [employer].  

## 2012-12-29 NOTE — Progress Notes (Signed)
Patient ID: [name], female   DOB: January 11, 1971, 42 y.o.   MRN: [id number]  Chief Complaint  Patient presents with  . Dysmenorrhea  . Vaginal Discharge    HPI [name] is a 42 y.o. female.  Patient's last menstrual period was 12/05/2012.Low back pain and pelvic pain preceding her menses and after.  [id number]   HPI  Past Medical History  Diagnosis Date  . Abnormal Pap smear   . Depression   . HIV disease     No past surgical history on file.  No family history on file.  Social History History  Substance Use Topics  . Smoking status: Never Smoker   . Smokeless tobacco: Never Used  . Alcohol Use: No    Allergies  Allergen Reactions  . Septra (Sulfamethoxazole-Tmp Ds) Rash    Current Outpatient Prescriptions  Medication Sig Dispense Refill  . cetirizine (ZYRTEC) 10 MG tablet Take 1 tablet (10 mg total) by mouth daily.  30 tablet  2  . dapsone 100 MG tablet Take 1 tablet (100 mg total) by mouth daily.  90 tablet  6  . Darunavir Ethanolate (PREZISTA) 800 MG tablet Take 1 tablet (800 mg total) by mouth daily.  90 tablet  4  . docusate sodium (COLACE) 100 MG capsule Take 1 capsule (100 mg total) by mouth 2 (two) times daily as needed. For constipation.  60 capsule  1  . emtricitabine-tenofovir (TRUVADA) 200-300 MG per tablet Take 1 tablet by mouth daily.  90 tablet  4  . ENSURE (ENSURE) Take 1 Can by mouth 2 (two) times daily between meals.  237 mL  11  . mirtazapine (REMERON) 15 MG tablet Take 1 tablet (15 mg total) by mouth at bedtime.  90 tablet  3  . omeprazole (PRILOSEC) 20 MG capsule Take 1 capsule (20 mg total) by mouth daily.  30 capsule  11  . ritonavir (NORVIR) 100 MG TABS Take 1 tablet (100 mg total) by mouth daily.  90 tablet  4  . DULoxetine (CYMBALTA) 30 MG capsule Take 1 tab by mouth daily for 1 week and then increase it to two tabs by mouth from there on.  90 capsule  1   No current facility-administered medications for this visit.    Review of  Systems Review of Systems  Constitutional: Negative.   Genitourinary: Positive for vaginal discharge (itch), menstrual problem and pelvic pain.  Musculoskeletal: Positive for back pain.    Blood pressure 108/69, pulse 79, temperature 97.2 F (36.2 C), temperature source Oral, height 5\' 2"  (1.575 m), weight 123 lb 14.4 oz (56.201 kg), last menstrual period 12/05/2012.  Physical Exam Physical Exam  Nursing note and vitals reviewed. Constitutional: She appears well-developed and well-nourished. No distress.  Pulmonary/Chest: Effort normal. No respiratory distress.  Abdominal: Soft. She exhibits no mass. There is no tenderness. There is no guarding.  Genitourinary: Vaginal discharge found.  Discharge and odor, cx nl, no CMT, uterus nl , no masses. STD and wet prep  Skin: Skin is warm and dry.  Psychiatric: She has a normal mood and affect. Her behavior is normal.    Data Reviewed Meds, pap test  Assessment    Dysmenorrhea H/O CIN 1 HIV disease     Plan    Pelvic US, RTC for result        Edyn Popoca 12/29/2012, 4:38 PM

## 2013-01-02 ENCOUNTER — Ambulatory Visit (HOSPITAL_COMMUNITY)
Admission: RE | Admit: 2013-01-02 | Discharge: 2013-01-02 | Disposition: A | Discharge status: Home / Self Care | Payer: 59 | Source: Ambulatory Visit | Attending: Obstetrics & Gynecology | Admitting: Obstetrics & Gynecology

## 2013-01-02 DIAGNOSIS — N946 Dysmenorrhea, unspecified: Secondary | ICD-10-CM | POA: Insufficient documentation

## 2013-01-02 DIAGNOSIS — N949 Unspecified condition associated with female genital organs and menstrual cycle: Secondary | ICD-10-CM | POA: Insufficient documentation

## 2013-01-02 DIAGNOSIS — N831 Corpus luteum cyst of ovary, unspecified side: Secondary | ICD-10-CM | POA: Insufficient documentation

## 2013-01-16 ENCOUNTER — Ambulatory Visit: Payer: 59 | Admitting: Obstetrics and Gynecology

## 2013-01-16 ENCOUNTER — Telehealth: Payer: Self-pay | Admitting: *Deleted

## 2013-01-16 NOTE — Telephone Encounter (Signed)
Message copied by Gerome Apley on Mon Jan 16, 2013  1:40 PM ------      Message from: Odelia Gage A      Created: Fri Jan 13, 2013  8:27 AM                   ----- Message -----         From: Adam Phenix, MD         Sent: 01/05/2013   1:47 PM           To: [facility]            BV, Rx Flagyl 500 mg BID 7 days ------

## 2013-01-17 MED ORDER — METRONIDAZOLE 500 MG PO TABS: 500.0000 mg | ORAL_TABLET | Freq: Two times a day (BID) | ORAL | Status: DC

## 2013-01-17 NOTE — Telephone Encounter (Signed)
Called pt and informed her of +BV and need for medication. Her Rx has been sent to her pharmacy and will be ready for pick up later today.  Pt also needs reschedule of missed appt from yesterday. I stated that I will have someone call her with appt information. Pt voiced understanding.

## 2013-01-19 ENCOUNTER — Other Ambulatory Visit: Payer: Self-pay | Admitting: *Deleted

## 2013-01-19 ENCOUNTER — Ambulatory Visit (INDEPENDENT_AMBULATORY_CARE_PROVIDER_SITE_OTHER): Payer: 59 | Admitting: Internal Medicine

## 2013-01-19 ENCOUNTER — Encounter: Payer: Self-pay | Admitting: Internal Medicine

## 2013-01-19 VITALS — BP 122/77 | HR 84 | Temp 98.0°F | Ht 61.0 in | Wt 125.0 lb

## 2013-01-19 DIAGNOSIS — B20 Human immunodeficiency virus [HIV] disease: Secondary | ICD-10-CM

## 2013-01-19 DIAGNOSIS — F329 Major depressive disorder, single episode, unspecified: Secondary | ICD-10-CM

## 2013-01-19 MED ORDER — ENSURE PO LIQD: 1.0000 | Freq: Two times a day (BID) | ORAL | Status: DC

## 2013-01-19 NOTE — Assessment & Plan Note (Signed)
She is doing much better with compliance and hopefully will have a resultant improved immune reconstitution over the next few months. She will return in 3 months though will reschedule if she is out of town at that time so she does not know her schedule for leaving town at this time. She will continue with dapsone as well.

## 2013-01-19 NOTE — Progress Notes (Signed)
  Subjective:    Patient ID: [name], female    DOB: 02-02-1971, 42 y.o.   MRN: [id number]  HPI She comes in for followup of HIV.  She is on darunavir, Norvir and Truvada.  In regards to her HIV, she tells me she is doing much better with compliance compared to previous when she had missed many doses of medications. In fact, today she is much more animated and happy and is now focusing on her own health and feels she is doing much better overall. She feels her depression is much better. She denies any missed doses recently. Her CD4 is up to 150 and I suspect her poor immune constitution previously is from poor compliance even with the undetectable viral load. Her viral load now is also down. She does tell me she is going to be traveling but will get her medication sent to her by her husband. Her weight is down as well and she says she still has trouble eating.   Review of Systems  Constitutional: Positive for unexpected weight change. Negative for fever, appetite change and fatigue.  HENT: Negative for sore throat and trouble swallowing.   Respiratory: Negative for cough and shortness of breath.   Cardiovascular: Negative for leg swelling.  Gastrointestinal: Negative for nausea, abdominal pain and diarrhea.  Musculoskeletal: Negative for myalgias, joint swelling and arthralgias.  Skin: Negative for rash.       Objective:   Physical Exam  Constitutional: She appears well-developed and well-nourished. No distress.  HENT:  Mouth/Throat: Oropharynx is clear and moist. No oropharyngeal exudate.  Cardiovascular: Normal rate, regular rhythm and normal heart sounds.  Exam reveals no gallop and no friction rub.   No murmur heard. Pulmonary/Chest: Effort normal and breath sounds normal. No respiratory distress. She has no wheezes. She has no rales.          Assessment & Plan:

## 2013-01-19 NOTE — Assessment & Plan Note (Signed)
He is improved overall and seems to be more accepting of her need to take medication

## 2013-02-09 ENCOUNTER — Ambulatory Visit (INDEPENDENT_AMBULATORY_CARE_PROVIDER_SITE_OTHER): Payer: 59 | Admitting: Obstetrics and Gynecology

## 2013-02-09 ENCOUNTER — Encounter: Payer: Self-pay | Admitting: Obstetrics and Gynecology

## 2013-02-09 VITALS — BP 113/73 | HR 88 | Temp 98.8°F | Ht 61.0 in | Wt 126.7 lb

## 2013-02-09 DIAGNOSIS — N760 Acute vaginitis: Secondary | ICD-10-CM

## 2013-02-09 NOTE — Progress Notes (Signed)
Here for follow up of dysmenorrhea, vaginal discharge. States still has yellow vaginal discharge.

## 2013-02-09 NOTE — Progress Notes (Signed)
Patient ID: [name], female   DOB: May 17, 1971, 42 y.o.   MRN: [id number] 42 yo presenting today to discuss results of ultrasound. Results reviewed and explained which demonstrated: IMPRESSION:  1. Endometrial thickness in the fundus is mildly prominent,  measuring 1.9 cm. Given that the patient's LMP was 12/05/2012,  this could be due to normal secretory phase endometrium. A follow-  up pelvic ultrasound immediately following the patient's menses  could be performed to exclude persistent endometrial thickening.  2. Normal left ovary. Nonvisualization of the right ovary  Patient reports persistent dysmenorrhea which is well controlled with ibuprofen. Patient reports persistent yellowish discharge. Patient reports discharged improved following completion of flagyl in March but returned. She denies any pruritis or abnormal odor.  GENERAL: Well-developed, well-nourished female in no acute distress.  ABDOMEN: Soft, nontender, nondistended. No organomegaly. PELVIC: Normal external female genitalia. Vagina is pink and rugated.  Normal discharge. Normal appearing cervix. Uterus is normal in size. No adnexal mass or tenderness. EXTREMITIES: No cyanosis, clubbing, or edema, 2+ distal pulses.  A/P 42 yo with vaginitis - Wet prep collected - Patient will be contacted with any abnormal results - RTC prn

## 2013-02-10 LAB — WET PREP, GENITAL: Trich, Wet Prep: NONE SEEN

## 2013-02-10 MED ORDER — METRONIDAZOLE 500 MG PO TABS: 500.0000 mg | ORAL_TABLET | Freq: Two times a day (BID) | ORAL | Status: AC

## 2013-02-10 NOTE — Addendum Note (Signed)
Addended by: Catalina Antigua on: 02/10/2013 08:21 AM   Modules accepted: Orders

## 2013-02-13 ENCOUNTER — Telehealth: Payer: Self-pay | Admitting: *Deleted

## 2013-02-13 NOTE — Telephone Encounter (Signed)
Called pt to inform her about her results and pt stated that she has already picked up medication and she did not have any questions.

## 2013-02-13 NOTE — Telephone Encounter (Signed)
Message copied by Faythe Casa on Mon Feb 13, 2013  4:44 PM ------      Message from: CONSTANT, Gigi Gin      Created: Fri Feb 10, 2013  8:21 AM       Please inform patient of positive BV. Flagyl was e-prescribed            Peggy ------

## 2013-02-13 NOTE — Telephone Encounter (Signed)
Message copied by Mannie Stabile on Mon Feb 13, 2013  4:42 PM ------      Message from: CONSTANT, Gigi Gin      Created: Fri Feb 10, 2013  8:21 AM       Please inform patient of positive BV. Flagyl was e-prescribed            Peggy ------

## 2013-02-13 NOTE — Telephone Encounter (Signed)
No answer. Unable to leave message. Will retry tomorrow.

## 2013-03-03 ENCOUNTER — Encounter: Payer: Self-pay | Admitting: *Deleted

## 2013-04-11 ENCOUNTER — Other Ambulatory Visit: Payer: 59

## 2013-04-25 ENCOUNTER — Telehealth: Payer: Self-pay | Admitting: *Deleted

## 2013-04-25 ENCOUNTER — Ambulatory Visit: Payer: 59 | Admitting: Internal Medicine

## 2013-04-25 NOTE — Telephone Encounter (Signed)
Left message for patient notifying her of today's missed appointment.  Patient also missed her lab appointment 2 weeks ago.  Per Dr. Luciana Axe, pt may be traveling out of the country at this time - she had tentative plans to travel home, but no exact dates at last appointment. Andree Coss, RN

## 2013-05-29 ENCOUNTER — Telehealth: Payer: Self-pay | Admitting: *Deleted

## 2013-05-29 NOTE — Telephone Encounter (Signed)
Attempting to schedule MD and PAP smear appointments.  Phone number invalid.

## 2013-06-29 ENCOUNTER — Telehealth: Payer: Self-pay | Admitting: *Deleted

## 2013-06-29 NOTE — Telephone Encounter (Signed)
Requested pt call [facility] for PAP smear, lab work and MD appts.

## 2013-09-14 ENCOUNTER — Other Ambulatory Visit: Payer: 59

## 2013-09-14 DIAGNOSIS — B20 Human immunodeficiency virus [HIV] disease: Secondary | ICD-10-CM

## 2013-09-15 LAB — T-HELPER CELL (CD4) - (RCID CLINIC ONLY): CD4 T Cell Abs: 250 /uL — ABNORMAL LOW (ref 400–2700)

## 2013-09-17 LAB — HIV-1 RNA QUANT-NO REFLEX-BLD: HIV-1 RNA Quant, Log: 1.76 {Log} — ABNORMAL HIGH (ref <1.30)

## 2013-09-23 ENCOUNTER — Other Ambulatory Visit: Payer: Self-pay | Admitting: Internal Medicine

## 2013-09-25 NOTE — Telephone Encounter (Signed)
Needs appointment

## 2013-10-03 ENCOUNTER — Encounter: Payer: Self-pay | Admitting: Internal Medicine

## 2013-10-03 ENCOUNTER — Ambulatory Visit (INDEPENDENT_AMBULATORY_CARE_PROVIDER_SITE_OTHER): Payer: 59 | Admitting: Internal Medicine

## 2013-10-03 VITALS — BP 109/70 | HR 85 | Temp 97.7°F | Ht 62.0 in | Wt 132.0 lb

## 2013-10-03 DIAGNOSIS — Z79899 Other long term (current) drug therapy: Secondary | ICD-10-CM

## 2013-10-03 DIAGNOSIS — R630 Anorexia: Secondary | ICD-10-CM | POA: Insufficient documentation

## 2013-10-03 DIAGNOSIS — B20 Human immunodeficiency virus [HIV] disease: Secondary | ICD-10-CM

## 2013-10-03 DIAGNOSIS — F329 Major depressive disorder, single episode, unspecified: Secondary | ICD-10-CM

## 2013-10-03 DIAGNOSIS — G479 Sleep disorder, unspecified: Secondary | ICD-10-CM

## 2013-10-03 DIAGNOSIS — Z23 Encounter for immunization: Secondary | ICD-10-CM

## 2013-10-03 DIAGNOSIS — Z113 Encounter for screening for infections with a predominantly sexual mode of transmission: Secondary | ICD-10-CM

## 2013-10-03 DIAGNOSIS — N760 Acute vaginitis: Secondary | ICD-10-CM | POA: Insufficient documentation

## 2013-10-03 MED ORDER — MEGESTROL ACETATE 400 MG/10ML PO SUSP: 400.0000 mg | Freq: Two times a day (BID) | ORAL | Status: DC

## 2013-10-03 MED ORDER — EMTRICITABINE-TENOFOVIR DF 200-300 MG PO TABS: 1.0000 | ORAL_TABLET | Freq: Every day | ORAL | Status: DC

## 2013-10-03 MED ORDER — METRONIDAZOLE 500 MG PO TABS: 500.0000 mg | ORAL_TABLET | Freq: Two times a day (BID) | ORAL | Status: DC

## 2013-10-03 MED ORDER — RITONAVIR 100 MG PO TABS: 100.0000 mg | ORAL_TABLET | Freq: Every day | ORAL | Status: DC

## 2013-10-03 MED ORDER — DARUNAVIR ETHANOLATE 800 MG PO TABS: 800.0000 mg | ORAL_TABLET | Freq: Every day | ORAL | Status: DC

## 2013-10-03 NOTE — Assessment & Plan Note (Signed)
She does not endorse any sleeping difficulties in does not report that she is taking the Remeron anymore and so this has not been refilled. She has sleeping difficulty in the future, she can restart the Remeron.

## 2013-10-03 NOTE — Assessment & Plan Note (Signed)
She is doing well with a CD4 count that is now 250. I'm going to have her stop the dapsone since it is above 200 and she has had a suppressed virus. I am going to have her return in 3 months for routine followup.

## 2013-10-03 NOTE — Assessment & Plan Note (Signed)
Her weight is good but has a poor appetite. I will try Megace to see if she has any benefit with this.

## 2013-10-03 NOTE — Progress Notes (Signed)
[facility] - Pharmacist    HPI: Timira Bieda is a 42 y.o. female here for follow-up.  She requests medication refills, an appetite stimulant, and an alternative to Dapsone due to cost.  Allergies: No Known Allergies  Vitals: Temp: 97.7 F (36.5 C) (12/02 1048) Temp src: Oral (12/02 1048) BP: 109/70 mmHg (12/02 1048) Pulse Rate: 85 (12/02 1048)  Past Medical History: Past Medical History  Diagnosis Date  . Abnormal Pap smear   . Depression   . HIV disease   . Back pain     Social History: History   Social History  . Marital Status: Married    Spouse Name: N/A    Number of Children: N/A  . Years of Education: N/A   Social History Main Topics  . Smoking status: Never Smoker   . Smokeless tobacco: Never Used  . Alcohol Use: No  . Drug Use: No  . Sexual Activity: Yes    Partners: Male    Birth Control/ Protection: None     Comment: pt. given condoms   Other Topics Concern  . None   Social History Narrative  . None    Current Regimen: Darunavir/ritonavir, Truvada  Labs: HIV 1 RNA Quant (copies/mL)  Date Value  09/14/2013 58*  12/28/2012 85*  11/08/2012 315*     CD4 T Cell Abs (/uL)  Date Value  09/14/2013 250*  12/28/2012 150*  11/08/2012 140*     Hep B S Ab (no units)  Date Value  06/05/2011 NEG      Hepatitis B Surface Ag (no units)  Date Value  06/05/2011 NEGATIVE      HCV Ab (no units)  Date Value  06/05/2011 NEGATIVE     CrCl: Estimated Creatinine Clearance: 59.7 ml/min (by C-G formula based on Cr of 0.98).  Lipids:    Component Value Date/Time   CHOL 170 06/05/2011 1137   TRIG 80 06/05/2011 1137   HDL 35* 06/05/2011 1137   CHOLHDL 4.9 06/05/2011 1137   VLDL 16 06/05/2011 1137   LDLCALC 119* 06/05/2011 1137    Assessment: Madeleine's CD4 count has improved, and her viral load is minimally detectable.  She was able to select 3 medications from the chart that are visually similar to those she is prescribed.  She needs  medication refills, and her insurance is mandating a change to a [facility] outpatient pharmacy starting in 2015.  We discussed this change, and she selected the [facility] for her prescriptions. She requests something to help with appetite - she has used Ensure in the past, so will discuss with Dr. Luciana Axe.   She had a Septra allergy in her chart with a reaction of rash. She denies this allergy, and states she has never had rash with any medication.  I also reviewed her clinic notes from the day the allergy was added, and there is no mention of rash.  There is a note from 2012 that states she may have had nausea with Septra, but she does not remember this being significant and would like to try it again instead of Dapsone.  Recommendations: Send her prescriptions to the [facility]. Discussed with Dr. Luciana Axe - will try Megace suspension for appetite. I have removed the Septra allergy from her chart.  I do think based on my discussion with her today Septra would be an option for her in the future if she requires it.  Sallee Provencal, Pharm.D., BCPS, AAHIVP Clinical  Infectious Disease Pharmacist [facility] 10/03/2013, 11:34 AM

## 2013-10-03 NOTE — Progress Notes (Signed)
   Subjective:    Patient ID: [name], female    DOB: 07/29/1971, 42 y.o.   MRN: [id number]  HPI She comes in for followup of her HIV. She was last seen in March of this year and has been on Prezista, Norvir and Truvada. She reports excellent compliance even when she was out of town in [place] for 5 months. She returned over a month ago due to the ongoing health crisis. She has been getting her medication denies any missed doses. She does have a poor appetite though has not had any significant weight loss. She also has had some vaginal discharge and back pain. She has no dysuria, no pyuria. She has had no menstrual issues. She does not endorse any depression or sleep problems.   Review of Systems  Constitutional: Negative for fever, fatigue and unexpected weight change.  HENT: Negative for trouble swallowing.   Eyes: Negative for visual disturbance.  Respiratory: Negative for cough.   Cardiovascular: Negative for chest pain.  Gastrointestinal: Negative for nausea and diarrhea.  Endocrine: Negative for polyuria.  Genitourinary: Positive for vaginal discharge. Negative for hematuria, genital sores and menstrual problem.  Musculoskeletal: Negative for joint swelling.  Skin: Negative for rash.  Neurological: Negative for light-headedness.  Hematological: Negative for adenopathy.  Psychiatric/Behavioral: Negative for sleep disturbance and dysphoric mood.       Objective:   Physical Exam  Constitutional: She is oriented to person, place, and time. She appears well-developed and well-nourished. No distress.  HENT:  Mouth/Throat: No oropharyngeal exudate.  Eyes: Right eye exhibits no discharge. Left eye exhibits no discharge. No scleral icterus.  Cardiovascular: Normal rate, regular rhythm and normal heart sounds.   No murmur heard. Pulmonary/Chest: Effort normal and breath sounds normal. No respiratory distress. She has no wheezes.  Genitourinary: Vaginal discharge found.    Lymphadenopathy:    She has no cervical adenopathy.  Neurological: She is alert and oriented to person, place, and time.  Skin: Skin is warm and dry. No rash noted.  Psychiatric: She has a normal mood and affect.          Assessment & Plan:

## 2013-10-03 NOTE — Assessment & Plan Note (Signed)
She denies any depression at this time. She does not appear to be on the Cymbalta now that she is not sure what medications she is taking.

## 2013-10-03 NOTE — Assessment & Plan Note (Signed)
I will treat her empirically with Flagyl but also check some urine tests to make sure there is no significant urinary tract infection or sexually transmitted infection. Her back pain also has been at same duration as her vaginal symptoms which makes this likely related. She is to call if she has no improvement over the next few days and she will be called if there is a positive finding on her urine tests

## 2013-10-04 LAB — URINALYSIS, ROUTINE W REFLEX MICROSCOPIC
Bilirubin Urine: NEGATIVE
Glucose, UA: NEGATIVE mg/dL
Ketones, ur: NEGATIVE mg/dL
Protein, ur: NEGATIVE mg/dL
Urobilinogen, UA: 0.2 mg/dL (ref 0.0–1.0)

## 2013-10-04 LAB — URINALYSIS, MICROSCOPIC ONLY
Bacteria, UA: NONE SEEN
Crystals: NONE SEEN

## 2013-10-06 ENCOUNTER — Telehealth: Payer: Self-pay | Admitting: *Deleted

## 2013-10-06 NOTE — Telephone Encounter (Signed)
Unable to leave a message for patient to check on her symptoms (no answer, voicemail is full and not accepting new messages at this time).   Andree Coss, RN

## 2013-10-06 NOTE — Telephone Encounter (Signed)
Spoke with patient and she is no longer having symptoms. [name]

## 2013-10-06 NOTE — Telephone Encounter (Signed)
Message copied by Andree Coss on Fri Oct 06, 2013 12:18 PM ------      Message from: Gardiner Barefoot      Created: Wed Oct 04, 2013  5:18 PM       Please call hernand see if she is better with her vaginitis.  Her urine tests were all negative.  If no better with flagyl, she may need to see gyn as she did before.  thanks ------

## 2013-11-05 ENCOUNTER — Other Ambulatory Visit: Payer: Self-pay | Admitting: Internal Medicine

## 2013-11-09 ENCOUNTER — Other Ambulatory Visit: Payer: Self-pay | Admitting: Internal Medicine

## 2013-12-06 ENCOUNTER — Encounter: Payer: Self-pay | Admitting: Obstetrics & Gynecology

## 2013-12-06 ENCOUNTER — Ambulatory Visit (INDEPENDENT_AMBULATORY_CARE_PROVIDER_SITE_OTHER): Payer: 59 | Admitting: Obstetrics & Gynecology

## 2013-12-06 VITALS — BP 120/82 | HR 81 | Temp 98.8°F | Wt 146.6 lb

## 2013-12-06 DIAGNOSIS — N390 Urinary tract infection, site not specified: Secondary | ICD-10-CM

## 2013-12-06 LAB — POCT URINALYSIS DIP (DEVICE)
Bilirubin Urine: NEGATIVE
GLUCOSE, UA: NEGATIVE mg/dL
Ketones, ur: NEGATIVE mg/dL
Nitrite: POSITIVE — AB
Protein, ur: 30 mg/dL — AB
SPECIFIC GRAVITY, URINE: 1.025 (ref 1.005–1.030)
UROBILINOGEN UA: 0.2 mg/dL (ref 0.0–1.0)
pH: 6 (ref 5.0–8.0)

## 2013-12-06 MED ORDER — SULFAMETHOXAZOLE-TMP DS 800-160 MG PO TABS: 1.0000 | ORAL_TABLET | Freq: Two times a day (BID) | ORAL | Status: DC

## 2013-12-06 NOTE — Patient Instructions (Signed)
Urinary Tract Infection  Urinary tract infections (UTIs) can develop anywhere along your urinary tract. Your urinary tract is your body's drainage system for removing wastes and extra water. Your urinary tract includes two kidneys, two ureters, a bladder, and a urethra. Your kidneys are a pair of bean-shaped organs. Each kidney is about the size of your fist. They are located below your ribs, one on each side of your spine.  CAUSES  Infections are caused by microbes, which are microscopic organisms, including fungi, viruses, and bacteria. These organisms are so small that they can only be seen through a microscope. Bacteria are the microbes that most commonly cause UTIs.  SYMPTOMS   Symptoms of UTIs may vary by age and gender of the patient and by the location of the infection. Symptoms in young women typically include a frequent and intense urge to urinate and a painful, burning feeling in the bladder or urethra during urination. Older women and men are more likely to be tired, shaky, and weak and have muscle aches and abdominal pain. A fever may mean the infection is in your kidneys. Other symptoms of a kidney infection include pain in your back or sides below the ribs, nausea, and vomiting.  DIAGNOSIS  To diagnose a UTI, your caregiver will ask you about your symptoms. Your caregiver also will ask to provide a urine sample. The urine sample will be tested for bacteria and white blood cells. White blood cells are made by your body to help fight infection.  TREATMENT   Typically, UTIs can be treated with medication. Because most UTIs are caused by a bacterial infection, they usually can be treated with the use of antibiotics. The choice of antibiotic and length of treatment depend on your symptoms and the type of bacteria causing your infection.  HOME CARE INSTRUCTIONS   If you were prescribed antibiotics, take them exactly as your caregiver instructs you. Finish the medication even if you feel better after you  have only taken some of the medication.   Drink enough water and fluids to keep your urine clear or pale yellow.   Avoid caffeine, tea, and carbonated beverages. They tend to irritate your bladder.   Empty your bladder often. Avoid holding urine for long periods of time.   Empty your bladder before and after sexual intercourse.   After a bowel movement, women should cleanse from front to back. Use each tissue only once.  SEEK MEDICAL CARE IF:    You have back pain.   You develop a fever.   Your symptoms do not begin to resolve within 3 days.  SEEK IMMEDIATE MEDICAL CARE IF:    You have severe back pain or lower abdominal pain.   You develop chills.   You have nausea or vomiting.   You have continued burning or discomfort with urination.  MAKE SURE YOU:    Understand these instructions.   Will watch your condition.   Will get help right away if you are not doing well or get worse.  Document Released: 07/29/2005 Document Revised: 04/19/2012 Document Reviewed: 11/27/2011  [employer] Patient Information 2014 [employer].

## 2013-12-06 NOTE — Progress Notes (Signed)
Pt. Here today complaining of irregular period; states her last period was in December. Pt. States she has not had sexual intercourse in almost three years. Also complaining of frequent urination and sharp pain when urinating. Urinalysis obtained.

## 2013-12-07 ENCOUNTER — Ambulatory Visit (INDEPENDENT_AMBULATORY_CARE_PROVIDER_SITE_OTHER): Payer: 59 | Admitting: Internal Medicine

## 2013-12-07 ENCOUNTER — Encounter: Payer: Self-pay | Admitting: Internal Medicine

## 2013-12-07 VITALS — BP 112/76 | HR 85 | Temp 98.7°F | Ht 60.0 in | Wt 147.0 lb

## 2013-12-07 DIAGNOSIS — R609 Edema, unspecified: Secondary | ICD-10-CM | POA: Insufficient documentation

## 2013-12-07 LAB — COMPLETE METABOLIC PANEL WITH GFR
ALBUMIN: 3.9 g/dL (ref 3.5–5.2)
ALK PHOS: 90 U/L (ref 39–117)
ALT: 27 U/L (ref 0–35)
AST: 15 U/L (ref 0–37)
BUN: 19 mg/dL (ref 6–23)
CALCIUM: 9.7 mg/dL (ref 8.4–10.5)
CHLORIDE: 105 meq/L (ref 96–112)
CO2: 25 meq/L (ref 19–32)
Creat: 1.05 mg/dL (ref 0.50–1.10)
GFR, EST AFRICAN AMERICAN: 76 mL/min
GFR, EST NON AFRICAN AMERICAN: 66 mL/min
GLUCOSE: 152 mg/dL — AB (ref 70–99)
POTASSIUM: 4.1 meq/L (ref 3.5–5.3)
Sodium: 137 mEq/L (ref 135–145)
Total Bilirubin: 0.3 mg/dL (ref 0.2–1.2)
Total Protein: 8.2 g/dL (ref 6.0–8.3)

## 2013-12-07 NOTE — Progress Notes (Signed)
   Subjective:    Patient ID: [name], female    DOB: 03/22/1971, 43 y.o.   MRN: [id number]  HPI Here for a work in visit.  Noted swelling in her face, abd, legs an hands that started 2 weeks ago.  Was around the time of starting mepron then stopped it.  No history of this.  Normal urination.     Review of Systems  Constitutional: Positive for unexpected weight change. Negative for fatigue.       Significant weight gain  Cardiovascular: Positive for leg swelling.  Gastrointestinal: Positive for abdominal distention.  Genitourinary: Negative for dysuria, urgency, hematuria and difficulty urinating.  Musculoskeletal: Negative for joint swelling.  Skin: Negative for rash.  Neurological: Negative for dizziness and light-headedness.       Objective:   Physical Exam  Constitutional: She appears well-developed and well-nourished. No distress.  HENT:  Bilateral facial swelling  Musculoskeletal:  Some edema of hands and feet, non pitting          Assessment & Plan:

## 2013-12-07 NOTE — Assessment & Plan Note (Signed)
Unclear etiology.  ? Nephritic or nephrotic syndrome.  Protein loss.  No abdominal pain.  Will do urine and blood studies.  RTC 1 week.

## 2013-12-08 ENCOUNTER — Telehealth: Payer: Self-pay | Admitting: *Deleted

## 2013-12-08 LAB — CBC WITH DIFFERENTIAL/PLATELET
BASOS ABS: 0 10*3/uL (ref 0.0–0.1)
BASOS PCT: 1 % (ref 0–1)
Eosinophils Absolute: 0.1 10*3/uL (ref 0.0–0.7)
Eosinophils Relative: 2 % (ref 0–5)
HCT: 40.1 % (ref 36.0–46.0)
Hemoglobin: 14.3 g/dL (ref 12.0–15.0)
LYMPHS PCT: 32 % (ref 12–46)
Lymphs Abs: 2 10*3/uL (ref 0.7–4.0)
MCH: 32.8 pg (ref 26.0–34.0)
MCHC: 35.7 g/dL (ref 30.0–36.0)
MCV: 92 fL (ref 78.0–100.0)
Monocytes Absolute: 0.7 10*3/uL (ref 0.1–1.0)
Monocytes Relative: 11 % (ref 3–12)
NEUTROS ABS: 3.4 10*3/uL (ref 1.7–7.7)
NEUTROS PCT: 54 % (ref 43–77)
PLATELETS: 173 10*3/uL (ref 150–400)
RBC: 4.36 MIL/uL (ref 3.87–5.11)
RDW: 15.5 % (ref 11.5–15.5)
WBC: 6.2 10*3/uL (ref 4.0–10.5)

## 2013-12-08 LAB — URINALYSIS, ROUTINE W REFLEX MICROSCOPIC
Bilirubin Urine: NEGATIVE
Glucose, UA: NEGATIVE mg/dL
KETONES UR: NEGATIVE mg/dL
NITRITE: POSITIVE — AB
PROTEIN: 30 mg/dL — AB
SPECIFIC GRAVITY, URINE: 1.015 (ref 1.005–1.030)
UROBILINOGEN UA: 0.2 mg/dL (ref 0.0–1.0)
pH: 7 (ref 5.0–8.0)

## 2013-12-08 LAB — PROTEIN, URINE, RANDOM: TOTAL PROTEIN, URINE: 64 mg/dL

## 2013-12-08 LAB — URINALYSIS, MICROSCOPIC ONLY: Casts: NONE SEEN

## 2013-12-08 LAB — SODIUM, URINE, RANDOM: SODIUM UR: 148 meq/L

## 2013-12-08 NOTE — Telephone Encounter (Signed)
Spoke with patient and she did not pick up the Bactrim. Advised her that she needed to do so as she does have a UTI. Patient will pick up Rx from [facility]. Wendall MolaJacqueline Cockerham CMA

## 2013-12-08 NOTE — Telephone Encounter (Signed)
Message copied by Macy MisOCKERHAM, Renae Mottley A on Fri Dec 08, 2013 10:28 AM ------      Message from: Gardiner BarefootOMER, ROBERT W      Created: Fri Dec 08, 2013  9:03 AM       Please see if pt did get the Bactrim and if she feels better (prescribed by GYN).  Her ua shows infection.   ------

## 2013-12-14 ENCOUNTER — Encounter: Payer: Self-pay | Admitting: Internal Medicine

## 2013-12-14 ENCOUNTER — Ambulatory Visit (INDEPENDENT_AMBULATORY_CARE_PROVIDER_SITE_OTHER): Payer: 59 | Admitting: Internal Medicine

## 2013-12-14 VITALS — BP 128/84 | HR 91 | Temp 98.4°F | Ht 63.0 in | Wt 149.0 lb

## 2013-12-14 DIAGNOSIS — Z79899 Other long term (current) drug therapy: Secondary | ICD-10-CM

## 2013-12-14 DIAGNOSIS — R609 Edema, unspecified: Secondary | ICD-10-CM

## 2013-12-14 DIAGNOSIS — Z113 Encounter for screening for infections with a predominantly sexual mode of transmission: Secondary | ICD-10-CM

## 2013-12-14 DIAGNOSIS — K219 Gastro-esophageal reflux disease without esophagitis: Secondary | ICD-10-CM

## 2013-12-14 DIAGNOSIS — B20 Human immunodeficiency virus [HIV] disease: Secondary | ICD-10-CM

## 2013-12-14 LAB — COMPLETE METABOLIC PANEL WITH GFR
ALBUMIN: 4.2 g/dL (ref 3.5–5.2)
ALT: 33 U/L (ref 0–35)
AST: 21 U/L (ref 0–37)
Alkaline Phosphatase: 77 U/L (ref 39–117)
BILIRUBIN TOTAL: 0.4 mg/dL (ref 0.2–1.2)
BUN: 15 mg/dL (ref 6–23)
CALCIUM: 10.2 mg/dL (ref 8.4–10.5)
CO2: 29 meq/L (ref 19–32)
Chloride: 102 mEq/L (ref 96–112)
Creat: 1.03 mg/dL (ref 0.50–1.10)
GFR, EST AFRICAN AMERICAN: 78 mL/min
GFR, EST NON AFRICAN AMERICAN: 68 mL/min
Glucose, Bld: 101 mg/dL — ABNORMAL HIGH (ref 70–99)
POTASSIUM: 4 meq/L (ref 3.5–5.3)
SODIUM: 136 meq/L (ref 135–145)
TOTAL PROTEIN: 8.5 g/dL — AB (ref 6.0–8.3)

## 2013-12-14 LAB — CBC WITH DIFFERENTIAL/PLATELET
BASOS ABS: 0 10*3/uL (ref 0.0–0.1)
BASOS PCT: 0 % (ref 0–1)
Eosinophils Absolute: 0.1 10*3/uL (ref 0.0–0.7)
Eosinophils Relative: 2 % (ref 0–5)
HCT: 40.8 % (ref 36.0–46.0)
Hemoglobin: 14.4 g/dL (ref 12.0–15.0)
Lymphocytes Relative: 33 % (ref 12–46)
Lymphs Abs: 2.4 10*3/uL (ref 0.7–4.0)
MCH: 32.8 pg (ref 26.0–34.0)
MCHC: 35.3 g/dL (ref 30.0–36.0)
MCV: 92.9 fL (ref 78.0–100.0)
Monocytes Absolute: 0.7 10*3/uL (ref 0.1–1.0)
Monocytes Relative: 9 % (ref 3–12)
NEUTROS ABS: 4.2 10*3/uL (ref 1.7–7.7)
NEUTROS PCT: 56 % (ref 43–77)
PLATELETS: 202 10*3/uL (ref 150–400)
RBC: 4.39 MIL/uL (ref 3.87–5.11)
RDW: 15.4 % (ref 11.5–15.5)
WBC: 7.4 10*3/uL (ref 4.0–10.5)

## 2013-12-14 LAB — LIPID PANEL
CHOLESTEROL: 241 mg/dL — AB (ref 0–200)
HDL: 55 mg/dL (ref >39)
LDL Cholesterol: 163 mg/dL — ABNORMAL HIGH (ref 0–99)
Total CHOL/HDL Ratio: 4.4 Ratio
Triglycerides: 116 mg/dL (ref <150)
VLDL: 23 mg/dL (ref 0–40)

## 2013-12-14 LAB — HEMOGLOBIN A1C
Hgb A1c MFr Bld: 7.5 % — ABNORMAL HIGH (ref <5.7)
Mean Plasma Glucose: 169 mg/dL — ABNORMAL HIGH (ref <117)

## 2013-12-14 MED ORDER — OMEPRAZOLE 20 MG PO CPDR: 20.0000 mg | DELAYED_RELEASE_CAPSULE | Freq: Every day | ORAL | Status: DC

## 2013-12-14 NOTE — Assessment & Plan Note (Addendum)
?   Early menopause, ? pregnancy (denies sexual activity), ? Thyroid.  Unclear.  I will check labs and repeat UA now that she has been treated for UTI.  Nephrotic or nephritic syndrome?  Follow up in 2-3 weeks.  Ultrasound (? Kidney disease), CXR (?hilar adenopathy) Repeat glucose, check A1c

## 2013-12-14 NOTE — Assessment & Plan Note (Signed)
Will restart Prilosec

## 2013-12-14 NOTE — Progress Notes (Signed)
   Subjective:    Patient ID: [name], female    DOB: 01/07/1971, 43 y.o.   MRN: [id number]  HPI  Here for follow up of swelling.  Noted swelling in her face, abd, legs an hands that started 3 weeks ago.  Was around the time of starting mepron then stopped it but has persisted.  No history of this.  Normal urination.  Also with bloating and acid reflux.  Check labs and glucose up, UA with infection, given Bactrim.  Improvement in the dysuria but still swollen.  No fever, no chills.  Also with night sweats.  UA with leukocytes and protein with some blood cells.     Review of Systems  Constitutional: Negative for fatigue.       Significant weight gain  HENT: Positive for facial swelling (symmetric). Negative for rhinorrhea, sinus pressure and sore throat.   Cardiovascular: Positive for leg swelling.  Gastrointestinal: Positive for abdominal distention. Negative for nausea and diarrhea.       Reflux  Genitourinary: Negative for dysuria, urgency, hematuria and difficulty urinating.  Musculoskeletal: Negative for joint swelling.  Skin: Negative for rash.  Neurological: Negative for dizziness and light-headedness.       Objective:   Physical Exam  Constitutional: She appears well-developed and well-nourished. No distress.  HENT:  Mouth/Throat: No oropharyngeal exudate.  Bilateral facial swelling slightly worse  Eyes: Right eye exhibits no discharge. Left eye exhibits no discharge. No scleral icterus.  Cardiovascular: Normal rate, regular rhythm and normal heart sounds.   No murmur heard. Pulmonary/Chest: Effort normal and breath sounds normal. No respiratory distress. She has no wheezes.  Musculoskeletal: She exhibits edema (non pitting).  Some edema of hands and feet, non pitting  Lymphadenopathy:    She has no cervical adenopathy.  Skin: No rash noted.  Psychiatric: She has a normal mood and affect.          Assessment & Plan:

## 2013-12-15 LAB — URINALYSIS, ROUTINE W REFLEX MICROSCOPIC
BILIRUBIN URINE: NEGATIVE
GLUCOSE, UA: NEGATIVE mg/dL
Ketones, ur: NEGATIVE mg/dL
Nitrite: POSITIVE — AB
PROTEIN: 100 mg/dL — AB
Specific Gravity, Urine: 1.023 (ref 1.005–1.030)
UROBILINOGEN UA: 0.2 mg/dL (ref 0.0–1.0)
pH: 6 (ref 5.0–8.0)

## 2013-12-15 LAB — HCG, QUANTITATIVE, PREGNANCY

## 2013-12-15 LAB — PROTEIN, URINE, RANDOM: TOTAL PROTEIN, URINE: 115 mg/dL

## 2013-12-15 LAB — HIV-1 RNA QUANT-NO REFLEX-BLD
HIV 1 RNA Quant: <20 copies/mL (ref <20)
HIV-1 RNA Quant, Log: <1.3 {Log} (ref <1.30)

## 2013-12-15 LAB — T-HELPER CELL (CD4) - (RCID CLINIC ONLY)
CD4 T CELL HELPER: 10 % — AB (ref 33–55)
CD4 T Cell Abs: 220 /uL — ABNORMAL LOW (ref 400–2700)

## 2013-12-15 LAB — RPR

## 2013-12-15 LAB — URINALYSIS, MICROSCOPIC ONLY
CASTS: NONE SEEN
SQUAMOUS EPITHELIAL / LPF: NONE SEEN

## 2013-12-15 LAB — PROLACTIN: PROLACTIN: 14.3 ng/mL

## 2013-12-15 LAB — TSH: TSH: 1.799 u[IU]/mL (ref 0.350–4.500)

## 2013-12-21 ENCOUNTER — Ambulatory Visit
Admission: RE | Admit: 2013-12-21 | Discharge: 2013-12-21 | Disposition: A | Discharge status: Home / Self Care | Payer: 59 | Source: Ambulatory Visit | Attending: Internal Medicine | Admitting: Internal Medicine

## 2013-12-21 ENCOUNTER — Other Ambulatory Visit: Payer: 59

## 2013-12-21 DIAGNOSIS — R609 Edema, unspecified: Secondary | ICD-10-CM

## 2013-12-26 ENCOUNTER — Encounter: Payer: 59 | Admitting: Internal Medicine

## 2013-12-29 ENCOUNTER — Emergency Department (HOSPITAL_COMMUNITY)
Admission: EM | Admit: 2013-12-29 | Discharge: 2013-12-29 | Disposition: A | Discharge status: Home / Self Care | Payer: 59 | Attending: Emergency Medicine | Admitting: Emergency Medicine

## 2013-12-29 ENCOUNTER — Emergency Department (HOSPITAL_COMMUNITY): Payer: 59

## 2013-12-29 ENCOUNTER — Telehealth: Payer: Self-pay | Admitting: *Deleted

## 2013-12-29 ENCOUNTER — Encounter (HOSPITAL_COMMUNITY): Payer: Self-pay | Admitting: Emergency Medicine

## 2013-12-29 DIAGNOSIS — R109 Unspecified abdominal pain: Secondary | ICD-10-CM | POA: Insufficient documentation

## 2013-12-29 DIAGNOSIS — R635 Abnormal weight gain: Secondary | ICD-10-CM | POA: Insufficient documentation

## 2013-12-29 DIAGNOSIS — Z21 Asymptomatic human immunodeficiency virus [HIV] infection status: Secondary | ICD-10-CM | POA: Insufficient documentation

## 2013-12-29 DIAGNOSIS — Z3202 Encounter for pregnancy test, result negative: Secondary | ICD-10-CM | POA: Insufficient documentation

## 2013-12-29 DIAGNOSIS — Z79899 Other long term (current) drug therapy: Secondary | ICD-10-CM | POA: Insufficient documentation

## 2013-12-29 DIAGNOSIS — Z8659 Personal history of other mental and behavioral disorders: Secondary | ICD-10-CM | POA: Insufficient documentation

## 2013-12-29 LAB — CBC WITH DIFFERENTIAL/PLATELET
BASOS ABS: 0 10*3/uL (ref 0.0–0.1)
Basophils Relative: 0 % (ref 0–1)
EOS PCT: 3 % (ref 0–5)
Eosinophils Absolute: 0.2 10*3/uL (ref 0.0–0.7)
HCT: 37.8 % (ref 36.0–46.0)
Hemoglobin: 13.5 g/dL (ref 12.0–15.0)
LYMPHS PCT: 43 % (ref 12–46)
Lymphs Abs: 2.2 10*3/uL (ref 0.7–4.0)
MCH: 33.5 pg (ref 26.0–34.0)
MCHC: 35.7 g/dL (ref 30.0–36.0)
MCV: 93.8 fL (ref 78.0–100.0)
Monocytes Absolute: 0.6 10*3/uL (ref 0.1–1.0)
Monocytes Relative: 12 % (ref 3–12)
NEUTROS PCT: 41 % — AB (ref 43–77)
Neutro Abs: 2.1 10*3/uL (ref 1.7–7.7)
PLATELETS: 175 10*3/uL (ref 150–400)
RBC: 4.03 MIL/uL (ref 3.87–5.11)
RDW: 14.7 % (ref 11.5–15.5)
WBC: 5.1 10*3/uL (ref 4.0–10.5)

## 2013-12-29 LAB — BASIC METABOLIC PANEL
BUN: 10 mg/dL (ref 6–23)
CALCIUM: 9.5 mg/dL (ref 8.4–10.5)
CO2: 24 mEq/L (ref 19–32)
Chloride: 106 mEq/L (ref 96–112)
Creatinine, Ser: 0.86 mg/dL (ref 0.50–1.10)
GFR calc Af Amer: >90 mL/min (ref >90)
GFR, EST NON AFRICAN AMERICAN: 83 mL/min — AB (ref >90)
Glucose, Bld: 105 mg/dL — ABNORMAL HIGH (ref 70–99)
Potassium: 3.6 mEq/L — ABNORMAL LOW (ref 3.7–5.3)
Sodium: 141 mEq/L (ref 137–147)

## 2013-12-29 LAB — PREGNANCY, URINE: Preg Test, Ur: NEGATIVE

## 2013-12-29 LAB — I-STAT TROPONIN, ED: Troponin i, poc: 0 ng/mL (ref 0.00–0.08)

## 2013-12-29 LAB — PRO B NATRIURETIC PEPTIDE: Pro B Natriuretic peptide (BNP): 58.9 pg/mL (ref 0–125)

## 2013-12-29 NOTE — Discharge Instructions (Signed)
Please follow up closely with your doctor for further management of your condition.  You will also need to have a chest xray repeat in 1 week to ensure no signs of infection.  Please avoid food high in salt content which may cause fluid retention.  Return if you have worsening trouble breathing, fever, productive cough, or if you have other concerns.

## 2013-12-29 NOTE — ED Notes (Addendum)
Pt sts since she has been taking a new medication is has her whole body swelling and when she eats she feels like her stomach is swelling up.. Pt obvious facial swelling .pt complaining of pain across upper abdominal area.

## 2013-12-29 NOTE — ED Provider Notes (Signed)
CSN: [id number]     Arrival date & time 12/29/13  1027 History   First MD Initiated Contact with Patient 12/29/13 1229     Chief Complaint  Patient presents with  . Facial Swelling  . Abdominal Pain     (Consider location/radiation/quality/duration/timing/severity/associated sxs/prior Treatment) HPI  43 year old female with history of HIV who presents complaining of body swelling. History is difficult to obtain as patient is a poor historian. Patient states she was really sick about 2 years ago where the weight was 90 pounds was discharged from the hospital. She was drinking Ensure which has helped her gain weight. She has stopped using Ensure.  2 months ago her doctor prescribed some supplemental syrup which she was taken but notice moderate weight gain along with facial swelling, abd swelling and swelling to her extremities.  But for the past 2 weeks she has noticed having mild chest discomfort, some shortness of breath, and having heart palpitation. She She follow up with her PCP last week for this complaint and was prescribed "a small pill to take daily".  She continues to notice body swelling which concerns her.  She denies any hx of heart disease.  She is not a smoker.  She has been taking her HIV medications.  She denies any specific pain at this time.  Report having nightsweats for the past week.  NO hx of cancer.    Past Medical History  Diagnosis Date  . Abnormal Pap smear   . Depression   . HIV disease   . Back pain    History reviewed. No pertinent past surgical history. History reviewed. No pertinent family history. History  Substance Use Topics  . Smoking status: Never Smoker   . Smokeless tobacco: Never Used  . Alcohol Use: No   OB History   Grav Para Term Preterm Abortions TAB SAB Ect Mult Living   17        1 7      Review of Systems  Constitutional: Negative for fever.  All other systems reviewed and are negative.      Allergies  Review of patient's  allergies indicates no known allergies.  Home Medications   Current Outpatient Rx  Name  Route  Sig  Dispense  Refill  . Darunavir Ethanolate (PREZISTA) 800 MG tablet   Oral   Take 1 tablet (800 mg total) by mouth daily.   90 tablet   4   . emtricitabine-tenofovir (TRUVADA) 200-300 MG per tablet   Oral   Take 1 tablet by mouth daily.   90 tablet   4   . ritonavir (NORVIR) 100 MG capsule   Oral   Take 100 mg by mouth daily with breakfast.         . EXPIRED: cetirizine (ZYRTEC) 10 MG tablet   Oral   Take 1 tablet (10 mg total) by mouth daily.   30 tablet   2    BP 128/81  Pulse 78  Temp(Src) 97.9 F (36.6 C) (Oral)  Resp 14  Wt 159 lb (72.122 kg)  SpO2 100%  LMP 10/16/2013 Physical Exam  Nursing note and vitals reviewed. Constitutional: She is oriented to person, place, and time. She appears well-developed and well-nourished. No distress.  Awake, alert, nontoxic appearance  HENT:  Head: Atraumatic.  No lips swelling, no tongue swelling no airway compromise.  Eyes: Conjunctivae are normal. Right eye exhibits no discharge. Left eye exhibits no discharge.  Neck: Neck supple.  Cardiovascular: Normal rate and regular rhythm.  Pulmonary/Chest: Effort normal. No respiratory distress. She has no wheezes. She has no rales. She exhibits no tenderness.  Abdominal: Soft. There is no tenderness. There is no rebound.  Musculoskeletal: She exhibits no tenderness.  ROM appears intact, no obvious focal weakness  Bilateral lowest remedies without palpable cords, erythema, edema, negative Homans sign  Neurological: She is alert and oriented to person, place, and time.  Mental status and motor strength appears intact  Skin: No rash noted.  Psychiatric: She has a normal mood and affect.    ED Course  Procedures (including critical care time)  3:50 PM Pt c/o having body swelling for the past several months also mild sob, not positional.  No prior hx of CHF. No active CP.   Her CXR and labs are reassuring.  No evidence to suggest acute CHF.  Xray did show mild atelectasis and/or infiltrate at medial right lung base.  However pt has no fever, or productive cough, and no hypoxia.  Therefore, doubt infectious etiology.  No acute finding located on today's exam.  No signs of drugs allergy.  i recommend pt to f/u with her PCP for closer management.  Strict return precaution discussed.  Care discussed with Dr. Deretha EmoryZackowski.      Labs Review Labs Reviewed  CBC WITH DIFFERENTIAL - Abnormal; Notable for the following:    Neutrophils Relative % 41 (*)    All other components within normal limits  BASIC METABOLIC PANEL - Abnormal; Notable for the following:    Potassium 3.6 (*)    Glucose, Bld 105 (*)    GFR calc non Af Amer 83 (*)    All other components within normal limits  PRO B NATRIURETIC PEPTIDE  PREGNANCY, URINE  I-STAT TROPOININ, ED   Imaging Review Dg Chest 2 View  12/29/2013   CLINICAL DATA:  Chest pain shortness of breath.  EXAM: CHEST  2 VIEW  COMPARISON:  Insert  FINDINGS: Mild atelectasis and/or infiltrate medial right lung base. Followup PA and lateral chest x-rays recommended to demonstrate clearing. Lungs are otherwise clear. No pleural effusion or pneumothorax. Heart size and pulmonary vascularity stable. Mild apical pleural thickening noted, unchanged, consistent with scarring. No pneumothorax. No acute bony abnormality.  IMPRESSION: Mild infiltrate present in the anterior medial portion of the right middle lobe. Follow-up chest x-rays recommended to demonstrate clearing.   Electronically Signed   By: Maisie Fushomas  Register   On: 12/29/2013 14:51    EKG Interpretation  None  MDM   Final diagnoses:  Weight gain    BP 127/81  Pulse 77  Temp(Src) 97.9 F (36.6 C) (Oral)  Resp 16  Wt 159 lb (72.122 kg)  SpO2 100%  LMP 10/16/2013  I have reviewed nursing notes and vital signs. I personally reviewed the imaging tests through PACS system  I reviewed  available ER/hospitalization records thought the EMR     Fayrene HelperBowie Shereece Wellborn, [place] JerseyPA-C 12/30/13 0602

## 2013-12-29 NOTE — ED Notes (Signed)
Patient transported to X-ray 

## 2013-12-29 NOTE — Telephone Encounter (Signed)
Patient walked into clinic today requesting appt with Dr. Luciana Axeomer and there are no MDs here today. Dr. Luciana Axeomer spoke with an MD at Internal Medicine regarding patient's unexplained swelling of face, upper and lower extremities. She was scheduled an appt with that office for 12/26/13 and it was cancelled due to weather and it was not rescheduled. I walked patient to the ED for evaluation.  Wendall MolaJacqueline Cockerham CMA

## 2014-01-01 NOTE — ED Provider Notes (Signed)
Medical screening examination/treatment/procedure(s) were performed by non-physician practitioner and as supervising physician I was immediately available for consultation/collaboration.   EKG Interpretation None        Sadrac Zeoli W. Del Overfelt, MD 01/01/14 1012 

## 2014-01-04 ENCOUNTER — Ambulatory Visit (INDEPENDENT_AMBULATORY_CARE_PROVIDER_SITE_OTHER): Payer: 59 | Admitting: Internal Medicine

## 2014-01-04 ENCOUNTER — Encounter: Payer: Self-pay | Admitting: Internal Medicine

## 2014-01-04 VITALS — BP 129/83 | HR 85 | Temp 98.4°F | Ht 63.0 in | Wt 167.0 lb

## 2014-01-04 DIAGNOSIS — B20 Human immunodeficiency virus [HIV] disease: Secondary | ICD-10-CM

## 2014-01-04 DIAGNOSIS — R609 Edema, unspecified: Secondary | ICD-10-CM

## 2014-01-04 MED ORDER — CETIRIZINE HCL 10 MG PO TABS: 10.0000 mg | ORAL_TABLET | Freq: Every day | ORAL | Status: DC

## 2014-01-04 NOTE — Assessment & Plan Note (Signed)
Worsening.  Encourage her to keep appt.  Rare side effect with mepron with adrenal insufficiency but no electrolyte abnormalities.  Will check cortisol.

## 2014-01-04 NOTE — Progress Notes (Signed)
   Subjective:    Patient ID: [name], female    DOB: 02/24/1971, 43 y.o.   MRN: [id number]  HPI  Here for follow up of swelling.  Noted swelling in her face, abd, legs an hands that started last month.  Was around the time of starting mepron then stopped it but has persisted.  No history of this. Increased frequency of urination.  Also with night sweats.  UA with leukocytes and protein with some blood cells.  Worsening.  Went to ED but no acute issues.  No fever, no chills. Had an appt with PCP but did not show up.  Now scheduled for next week.     Review of Systems  Constitutional: Negative for fatigue.       Significant weight gain  HENT: Positive for facial swelling (symmetric). Negative for rhinorrhea, sinus pressure and sore throat.   Cardiovascular: Positive for leg swelling.  Gastrointestinal: Positive for abdominal distention. Negative for nausea and diarrhea.       Reflux  Genitourinary: Negative for dysuria, urgency, hematuria and difficulty urinating.  Musculoskeletal: Negative for joint swelling.  Skin: Negative for rash.  Neurological: Negative for dizziness and light-headedness.       Objective:   Physical Exam  Constitutional: She appears well-developed and well-nourished. No distress.  HENT:  Mouth/Throat: No oropharyngeal exudate.  Bilateral facial swelling, increased from previous  Eyes: Right eye exhibits no discharge. Left eye exhibits no discharge. No scleral icterus.  Cardiovascular: Normal rate, regular rhythm and normal heart sounds.   No murmur heard. Pulmonary/Chest: Effort normal and breath sounds normal. No respiratory distress. She has no wheezes.  Abdominal:  Tense abdominal swelling  Musculoskeletal: She exhibits edema (non pitting).  Some edema of hands and feet, non pitting, increased  Lymphadenopathy:    She has no cervical adenopathy.  Skin: No rash noted.  Psychiatric: She has a normal mood and affect.          Assessment & Plan:

## 2014-01-05 LAB — CORTISOL: Cortisol, Plasma: 0.5 ug/dL

## 2014-01-09 ENCOUNTER — Ambulatory Visit: Payer: 59 | Admitting: Internal Medicine

## 2014-01-16 ENCOUNTER — Telehealth: Payer: Self-pay | Admitting: *Deleted

## 2014-01-16 NOTE — Telephone Encounter (Signed)
Message copied by Macy MisOCKERHAM, Sholom Dulude A on Tue Jan 16, 2014  3:41 PM ------      Message from: Gardiner BarefootOMER, ROBERT W      Created: Mon Jan 15, 2014  6:17 PM       She no showed again for her new PCP appt I got her.  She will need to find a different PCP after no show x 2.  Thanks ------

## 2014-01-16 NOTE — Telephone Encounter (Signed)
[facility] with Dr. Gust BroomsJaralla for 01/26/14 at 11:15 AM. Office notes, labs, and imaging faxed to [phone]. Patient advised that it is very important that she keep this appointment as she has no showed [facility] the [facility]. Their office will not reschedule if she no shows.  Wendall MolaJacqueline Cockerham

## 2014-01-17 ENCOUNTER — Ambulatory Visit: Payer: 59 | Admitting: Physician Assistant

## 2014-02-04 ENCOUNTER — Emergency Department (HOSPITAL_COMMUNITY): Payer: 59

## 2014-02-04 ENCOUNTER — Inpatient Hospital Stay (HOSPITAL_COMMUNITY)
Admission: EM | Admit: 2014-02-04 | Discharge: 2014-02-06 | DRG: 100 | Disposition: A | Discharge status: Home / Self Care | Payer: 59 | Attending: Internal Medicine | Admitting: Internal Medicine

## 2014-02-04 ENCOUNTER — Encounter (HOSPITAL_COMMUNITY): Payer: Self-pay | Admitting: Emergency Medicine

## 2014-02-04 DIAGNOSIS — F3289 Other specified depressive episodes: Secondary | ICD-10-CM | POA: Diagnosis present

## 2014-02-04 DIAGNOSIS — R630 Anorexia: Secondary | ICD-10-CM

## 2014-02-04 DIAGNOSIS — Z79899 Other long term (current) drug therapy: Secondary | ICD-10-CM

## 2014-02-04 DIAGNOSIS — G40309 Generalized idiopathic epilepsy and epileptic syndromes, not intractable, without status epilepticus: Principal | ICD-10-CM | POA: Diagnosis present

## 2014-02-04 DIAGNOSIS — G250 Essential tremor: Secondary | ICD-10-CM | POA: Diagnosis present

## 2014-02-04 DIAGNOSIS — E872 Acidosis, unspecified: Secondary | ICD-10-CM | POA: Diagnosis present

## 2014-02-04 DIAGNOSIS — A812 Progressive multifocal leukoencephalopathy: Secondary | ICD-10-CM

## 2014-02-04 DIAGNOSIS — G252 Other specified forms of tremor: Secondary | ICD-10-CM

## 2014-02-04 DIAGNOSIS — R519 Headache, unspecified: Secondary | ICD-10-CM

## 2014-02-04 DIAGNOSIS — F329 Major depressive disorder, single episode, unspecified: Secondary | ICD-10-CM | POA: Diagnosis present

## 2014-02-04 DIAGNOSIS — N390 Urinary tract infection, site not specified: Secondary | ICD-10-CM

## 2014-02-04 DIAGNOSIS — R569 Unspecified convulsions: Secondary | ICD-10-CM | POA: Diagnosis present

## 2014-02-04 DIAGNOSIS — E876 Hypokalemia: Secondary | ICD-10-CM | POA: Diagnosis present

## 2014-02-04 DIAGNOSIS — E119 Type 2 diabetes mellitus without complications: Secondary | ICD-10-CM | POA: Diagnosis present

## 2014-02-04 DIAGNOSIS — R51 Headache: Secondary | ICD-10-CM

## 2014-02-04 DIAGNOSIS — B20 Human immunodeficiency virus [HIV] disease: Secondary | ICD-10-CM

## 2014-02-04 DIAGNOSIS — Z21 Asymptomatic human immunodeficiency virus [HIV] infection status: Secondary | ICD-10-CM | POA: Diagnosis present

## 2014-02-04 DIAGNOSIS — A498 Other bacterial infections of unspecified site: Secondary | ICD-10-CM

## 2014-02-04 DIAGNOSIS — Z8661 Personal history of infections of the central nervous system: Secondary | ICD-10-CM

## 2014-02-04 HISTORY — DX: Unspecified convulsions: R56.9

## 2014-02-04 LAB — URINALYSIS, ROUTINE W REFLEX MICROSCOPIC
BILIRUBIN URINE: NEGATIVE
GLUCOSE, UA: NEGATIVE mg/dL
Ketones, ur: NEGATIVE mg/dL
Leukocytes, UA: NEGATIVE
Nitrite: POSITIVE — AB
PH: 6 (ref 5.0–8.0)
Protein, ur: 30 mg/dL — AB
SPECIFIC GRAVITY, URINE: 1.02 (ref 1.005–1.030)
Urobilinogen, UA: 1 mg/dL (ref 0.0–1.0)

## 2014-02-04 LAB — ETHANOL: Alcohol, Ethyl (B): <11 mg/dL (ref 0–11)

## 2014-02-04 LAB — GLUCOSE, CSF: Glucose, CSF: 61 mg/dL (ref 43–76)

## 2014-02-04 LAB — COMPREHENSIVE METABOLIC PANEL
ALBUMIN: 4.4 g/dL (ref 3.5–5.2)
ALT: 29 U/L (ref 0–35)
AST: 31 U/L (ref 0–37)
Alkaline Phosphatase: 99 U/L (ref 39–117)
BILIRUBIN TOTAL: 0.4 mg/dL (ref 0.3–1.2)
BUN: 13 mg/dL (ref 6–23)
CO2: 12 mEq/L — ABNORMAL LOW (ref 19–32)
CREATININE: 1.04 mg/dL (ref 0.50–1.10)
Calcium: 10.5 mg/dL (ref 8.4–10.5)
Chloride: 101 mEq/L (ref 96–112)
GFR calc Af Amer: 76 mL/min — ABNORMAL LOW (ref >90)
GFR calc non Af Amer: 66 mL/min — ABNORMAL LOW (ref >90)
Glucose, Bld: 144 mg/dL — ABNORMAL HIGH (ref 70–99)
Potassium: 3.6 mEq/L — ABNORMAL LOW (ref 3.7–5.3)
Sodium: 140 mEq/L (ref 137–147)
Total Protein: 9.6 g/dL — ABNORMAL HIGH (ref 6.0–8.3)

## 2014-02-04 LAB — CSF CELL COUNT WITH DIFFERENTIAL
RBC COUNT CSF: 1 /mm3 — AB
RBC Count, CSF: 1 /mm3 — ABNORMAL HIGH
TUBE #: 1
WBC CSF: 8 /mm3 — AB (ref 0–5)
WBC, CSF: 9 /mm3 — ABNORMAL HIGH (ref 0–5)

## 2014-02-04 LAB — RAPID URINE DRUG SCREEN, HOSP PERFORMED
Amphetamines: NOT DETECTED
Barbiturates: NOT DETECTED
Benzodiazepines: NOT DETECTED
Cocaine: NOT DETECTED
OPIATES: NOT DETECTED
TETRAHYDROCANNABINOL: NOT DETECTED

## 2014-02-04 LAB — CBC WITH DIFFERENTIAL/PLATELET
BASOS ABS: 0 10*3/uL (ref 0.0–0.1)
Basophils Relative: 0 % (ref 0–1)
Eosinophils Absolute: 0.7 10*3/uL (ref 0.0–0.7)
Eosinophils Relative: 6 % — ABNORMAL HIGH (ref 0–5)
HEMATOCRIT: 40.6 % (ref 36.0–46.0)
Hemoglobin: 14.1 g/dL (ref 12.0–15.0)
LYMPHS ABS: 7 10*3/uL — AB (ref 0.7–4.0)
Lymphocytes Relative: 65 % — ABNORMAL HIGH (ref 12–46)
MCH: 33.3 pg (ref 26.0–34.0)
MCHC: 34.7 g/dL (ref 30.0–36.0)
MCV: 96 fL (ref 78.0–100.0)
Monocytes Absolute: 1 10*3/uL (ref 0.1–1.0)
Monocytes Relative: 9 % (ref 3–12)
Neutro Abs: 2.2 10*3/uL (ref 1.7–7.7)
Neutrophils Relative %: 20 % — ABNORMAL LOW (ref 43–77)
Platelets: 258 10*3/uL (ref 150–400)
RBC: 4.23 MIL/uL (ref 3.87–5.11)
RDW: 12.7 % (ref 11.5–15.5)
WBC: 10.9 10*3/uL — ABNORMAL HIGH (ref 4.0–10.5)

## 2014-02-04 LAB — BASIC METABOLIC PANEL
BUN: 11 mg/dL (ref 6–23)
CALCIUM: 9.7 mg/dL (ref 8.4–10.5)
CO2: 17 mEq/L — ABNORMAL LOW (ref 19–32)
Chloride: 106 mEq/L (ref 96–112)
Creatinine, Ser: 0.85 mg/dL (ref 0.50–1.10)
GFR, EST NON AFRICAN AMERICAN: 84 mL/min — AB (ref >90)
GLUCOSE: 93 mg/dL (ref 70–99)
Potassium: 4.8 mEq/L (ref 3.7–5.3)
SODIUM: 136 meq/L — AB (ref 137–147)

## 2014-02-04 LAB — GRAM STAIN: GRAM STAIN: NONE SEEN

## 2014-02-04 LAB — PROTIME-INR
INR: 1.13 (ref 0.00–1.49)
Prothrombin Time: 14.3 seconds (ref 11.6–15.2)

## 2014-02-04 LAB — SALICYLATE LEVEL

## 2014-02-04 LAB — URINE MICROSCOPIC-ADD ON

## 2014-02-04 LAB — ACETAMINOPHEN LEVEL

## 2014-02-04 LAB — PROTEIN, CSF: Total  Protein, CSF: 62 mg/dL — ABNORMAL HIGH (ref 15–45)

## 2014-02-04 LAB — POC URINE PREG, ED: Preg Test, Ur: NEGATIVE

## 2014-02-04 MED ORDER — ACETAMINOPHEN 325 MG PO TABS
650.0000 mg | ORAL_TABLET | Freq: Once | ORAL | Status: AC
  Administered 2014-02-04: 650 mg via ORAL
  Filled 2014-02-04: qty 2

## 2014-02-04 MED ORDER — SODIUM CHLORIDE 0.9 % IV SOLN
1000.0000 mg | Freq: Once | INTRAVENOUS | Status: AC
  Administered 2014-02-04: 1000 mg via INTRAVENOUS
  Filled 2014-02-04: qty 10

## 2014-02-04 MED ORDER — SODIUM CHLORIDE 0.9 % IV BOLUS (SEPSIS)
1000.0000 mL | INTRAVENOUS | Status: AC
  Administered 2014-02-04: 1000 mL via INTRAVENOUS

## 2014-02-04 NOTE — ED Provider Notes (Signed)
CSN: [id number]     Arrival date & time 02/04/14  1641 History   First MD Initiated Contact with Patient 02/04/14 1644     Chief Complaint  Patient presents with  . Seizures     (Consider location/radiation/quality/duration/timing/severity/associated sxs/prior Treatment) Patient is a 43 y.o. female presenting with seizures. The history is provided by the spouse.  Seizures Seizure activity on arrival: no   Seizure type:  Grand mal Preceding symptoms comment:  No obvious sx per husband Initial focality:  None Episode characteristics: generalized shaking and stiffening   Postictal symptoms: somnolence   Severity:  Mild Duration: 2-3 minutes. Timing:  Once Number of seizures this episode:  1 Progression:  Resolved Context comment:  Hx of HIV Recent head injury:  No recent head injuries PTA treatment:  None History of seizures: no     Past Medical History  Diagnosis Date  . Abnormal Pap smear   . Depression   . HIV disease   . Back pain    History reviewed. No pertinent past surgical history. History reviewed. No pertinent family history. History  Substance Use Topics  . Smoking status: Never Smoker   . Smokeless tobacco: Never Used  . Alcohol Use: No   OB History   Grav Para Term Preterm Abortions TAB SAB Ect Mult Living   17        1 7      Review of Systems  Constitutional: Negative for fever and fatigue.  HENT: Negative for congestion and drooling.   Eyes: Negative for pain.  Respiratory: Negative for cough and shortness of breath.   Cardiovascular: Negative for chest pain.  Gastrointestinal: Negative for nausea, vomiting, abdominal pain and diarrhea.  Genitourinary: Negative for dysuria and hematuria.  Musculoskeletal: Negative for back pain, gait problem and neck pain.  Skin: Negative for color change.  Neurological: Positive for seizures. Negative for dizziness and headaches.  Hematological: Negative for adenopathy.  Psychiatric/Behavioral: Negative for  behavioral problems.  All other systems reviewed and are negative.      Allergies  Review of patient's allergies indicates no known allergies.  Home Medications   Current Outpatient Rx  Name  Route  Sig  Dispense  Refill  . cetirizine (ZYRTEC) 10 MG tablet   Oral   Take 1 tablet (10 mg total) by mouth daily.   30 tablet   5   . Darunavir Ethanolate (PREZISTA) 800 MG tablet   Oral   Take 1 tablet (800 mg total) by mouth daily.   90 tablet   4   . emtricitabine-tenofovir (TRUVADA) 200-300 MG per tablet   Oral   Take 1 tablet by mouth daily.   90 tablet   4   . ritonavir (NORVIR) 100 MG capsule   Oral   Take 100 mg by mouth daily with breakfast.          BP 102/50  Pulse 100  Resp 20  SpO2 96% Physical Exam  Nursing note and vitals reviewed. Constitutional: She is oriented to person, place, and time. She appears well-developed and well-nourished.  HENT:  Head: Normocephalic and atraumatic.  Mouth/Throat: Oropharynx is clear and moist. No oropharyngeal exudate.  Abrasion to inner lower lip.   Normal ROM of neck w/out pain.   Eyes: Conjunctivae and EOM are normal. Pupils are equal, round, and reactive to light.  Neck: Normal range of motion. Neck supple.  Cardiovascular: Normal rate, regular rhythm, normal heart sounds and intact distal pulses.  Exam reveals no gallop and  no friction rub.   No murmur heard. Pulmonary/Chest: Effort normal and breath sounds normal. No respiratory distress. She has no wheezes.  Abdominal: Soft. Bowel sounds are normal. There is no tenderness. There is no rebound and no guarding.  Musculoskeletal: Normal range of motion. She exhibits no edema and no tenderness.  Neurological: She is alert and oriented to person, place, and time. GCS eye subscore is 1. GCS verbal subscore is 1. GCS motor subscore is 5.  Arrival: Drowsy and somnolent on exam. Will not open eyes or follow commands. She will localize pain and withdraw the extremity from  pain. She appears to be moving all extremities.  6:12 PM alert, oriented x2, thought it was 2016 speech: normal in context and clarity memory: intact grossly cranial nerves II-XII: intact motor strength: full proximally and distally no involuntary movements or tremors sensation: intact to light touch diffusely  cerebellar: finger-to-nose and heel-to-shin intact gait: normal forwards and backwards   Skin: Skin is warm and dry.  Psychiatric: She has a normal mood and affect. Her behavior is normal.    ED Course  Procedures (including critical care time) Labs Review Labs Reviewed  CBC WITH DIFFERENTIAL - Abnormal; Notable for the following:    WBC 10.9 (*)    Neutrophils Relative % 20 (*)    Lymphocytes Relative 65 (*)    Eosinophils Relative 6 (*)    Lymphs Abs 7.0 (*)    All other components within normal limits  COMPREHENSIVE METABOLIC PANEL - Abnormal; Notable for the following:    Potassium 3.6 (*)    CO2 12 (*)    Glucose, Bld 144 (*)    Total Protein 9.6 (*)    GFR calc non Af Amer 66 (*)    GFR calc Af Amer 76 (*)    All other components within normal limits  SALICYLATE LEVEL - Abnormal; Notable for the following:    Salicylate Lvl <2.0 (*)    All other components within normal limits  URINALYSIS, ROUTINE W REFLEX MICROSCOPIC - Abnormal; Notable for the following:    APPearance CLOUDY (*)    Hgb urine dipstick SMALL (*)    Protein, ur 30 (*)    Nitrite POSITIVE (*)    All other components within normal limits  URINE MICROSCOPIC-ADD ON - Abnormal; Notable for the following:    Squamous Epithelial / LPF MANY (*)    Bacteria, UA MANY (*)    All other components within normal limits  PROTEIN, CSF - Abnormal; Notable for the following:    Total  Protein, CSF 62 (*)    All other components within normal limits  CSF CELL COUNT WITH DIFFERENTIAL - Abnormal; Notable for the following:    RBC Count, CSF 1 (*)    WBC, CSF 9 (*)    All other components within normal  limits  CSF CELL COUNT WITH DIFFERENTIAL - Abnormal; Notable for the following:    RBC Count, CSF 1 (*)    WBC, CSF 8 (*)    All other components within normal limits  BASIC METABOLIC PANEL - Abnormal; Notable for the following:    Sodium 136 (*)    CO2 17 (*)    GFR calc non Af Amer 84 (*)    All other components within normal limits  GRAM STAIN  URINE CULTURE  CSF CULTURE  ACETAMINOPHEN LEVEL  ETHANOL  URINE RAPID DRUG SCREEN (HOSP PERFORMED)  PROTIME-INR  GLUCOSE, CSF  PATHOLOGIST SMEAR REVIEW  HSV(HERPES SMPLX VRS)ABS-I+II(IGG)-CSF  POC URINE PREG, ED   Imaging Review No results found.   EKG Interpretation   Date/Time:  Sunday February 04 2014 16:46:21 EDT Ventricular Rate:  101 PR Interval:  129 QRS Duration: 73 QT Interval:  334 QTC Calculation: 433 R Axis:   68 Text Interpretation:  Sinus tachycardia Biatrial enlargement Anteroseptal  infarct, age indeterminate No significant change since last tracing  Confirmed by Jahfari Ambers  MD, Sedalia Greeson ([id number]) on 02/04/2014 4:58:55 PM      MDM   Final diagnoses:  Headache  Seizure  UTI (lower urinary tract infection)    4:59 PM 43 y.o. female w hx of HIV (CD4 of 220 on 12/14/13) who presents with altered mental status. The husband notes that immediately prior to arrival they were driving to the store to get some food. He states that she has been complaining of some generalized weakness and that she felt cold has otherwise been well without fever, vomiting, diarrhea, cough, or other complaints. He states she's been compliant with her medications. He states that she began shaking in her upper and lower extremities and became stiff. The symptoms seemed to last several minutes and then resolved. She is currently drowsy on exam. She will not open her eyes or follow commands but she will localize pain and appears to be moving all extremities.  Pt now informing me that she awoke w/ a HA this morning that persists, 7/10 in pain. Will give  tylenol.  Pt had LP under fluoro. Neuro consulted. Dr. Amada Jupiter to eval pt in ED. Will admit to internal medicine service. Will order rocephin for UTI.     Junius Argyle, MD 02/05/14 [phone]

## 2014-02-04 NOTE — ED Notes (Signed)
Lab called with Gram Stain results.  Lab states No organisms seen. WBC present predominately mononuclear

## 2014-02-04 NOTE — ED Notes (Signed)
Pt brought in unreposive by spouse, who reports pt convulsing while they were shopping. Prior to which pt told she was nauseated. At the moment pt is responding to pain stimuli.

## 2014-02-04 NOTE — ED Notes (Signed)
Family member said he will be in the waiting room.

## 2014-02-04 NOTE — ED Notes (Signed)
Pt A&O x4 talking and performing all task requested appropriatelt. Pt c/o being cold, given socks and 4 warm blankets. Pt husband at bedside. Pt in NAD. Pt c/o HA 7/10.

## 2014-02-05 ENCOUNTER — Inpatient Hospital Stay (HOSPITAL_COMMUNITY): Payer: 59

## 2014-02-05 ENCOUNTER — Encounter (HOSPITAL_COMMUNITY): Payer: Self-pay | Admitting: *Deleted

## 2014-02-05 DIAGNOSIS — B20 Human immunodeficiency virus [HIV] disease: Secondary | ICD-10-CM

## 2014-02-05 DIAGNOSIS — R569 Unspecified convulsions: Secondary | ICD-10-CM

## 2014-02-05 DIAGNOSIS — Z8661 Personal history of infections of the central nervous system: Secondary | ICD-10-CM

## 2014-02-05 DIAGNOSIS — N390 Urinary tract infection, site not specified: Secondary | ICD-10-CM

## 2014-02-05 DIAGNOSIS — A498 Other bacterial infections of unspecified site: Secondary | ICD-10-CM

## 2014-02-05 LAB — CBC
HCT: 36.6 % (ref 36.0–46.0)
HEMOGLOBIN: 13 g/dL (ref 12.0–15.0)
MCH: 33.5 pg (ref 26.0–34.0)
MCHC: 35.5 g/dL (ref 30.0–36.0)
MCV: 94.3 fL (ref 78.0–100.0)
Platelets: 195 10*3/uL (ref 150–400)
RBC: 3.88 MIL/uL (ref 3.87–5.11)
RDW: 12.7 % (ref 11.5–15.5)
WBC: 5.4 10*3/uL (ref 4.0–10.5)

## 2014-02-05 LAB — COMPREHENSIVE METABOLIC PANEL
ALT: 22 U/L (ref 0–35)
AST: 21 U/L (ref 0–37)
Albumin: 3.3 g/dL — ABNORMAL LOW (ref 3.5–5.2)
Alkaline Phosphatase: 76 U/L (ref 39–117)
BUN: 8 mg/dL (ref 6–23)
CO2: 20 mEq/L (ref 19–32)
Calcium: 9.7 mg/dL (ref 8.4–10.5)
Chloride: 106 mEq/L (ref 96–112)
Creatinine, Ser: 1.03 mg/dL (ref 0.50–1.10)
GFR calc Af Amer: 77 mL/min — ABNORMAL LOW (ref >90)
GFR calc non Af Amer: 67 mL/min — ABNORMAL LOW (ref >90)
Glucose, Bld: 121 mg/dL — ABNORMAL HIGH (ref 70–99)
Potassium: 3.4 mEq/L — ABNORMAL LOW (ref 3.7–5.3)
Sodium: 141 mEq/L (ref 137–147)
Total Bilirubin: 0.3 mg/dL (ref 0.3–1.2)
Total Protein: 8 g/dL (ref 6.0–8.3)

## 2014-02-05 LAB — PHOSPHORUS: Phosphorus: 2.4 mg/dL (ref 2.3–4.6)

## 2014-02-05 LAB — CBG MONITORING, ED: Glucose-Capillary: 134 mg/dL — ABNORMAL HIGH (ref 70–99)

## 2014-02-05 LAB — PROTIME-INR
INR: 1.11 (ref 0.00–1.49)
Prothrombin Time: 14.1 seconds (ref 11.6–15.2)

## 2014-02-05 LAB — TROPONIN I: Troponin I: <0.3 ng/mL (ref <0.30)

## 2014-02-05 LAB — MAGNESIUM: MAGNESIUM: 1.9 mg/dL (ref 1.5–2.5)

## 2014-02-05 LAB — APTT: aPTT: 26 seconds (ref 24–37)

## 2014-02-05 MED ORDER — ENOXAPARIN SODIUM 40 MG/0.4ML ~~LOC~~ SOLN: 40.0000 mg | Freq: Every day | SUBCUTANEOUS | Status: DC

## 2014-02-05 MED ORDER — EMTRICITABINE-TENOFOVIR DF 200-300 MG PO TABS
1.0000 | ORAL_TABLET | Freq: Every day | ORAL | Status: DC
  Administered 2014-02-05 – 2014-02-06 (×2): 1 via ORAL
  Filled 2014-02-05 (×2): qty 1

## 2014-02-05 MED ORDER — DEXTROSE 5 % IV SOLN
1.0000 g | Freq: Once | INTRAVENOUS | Status: DC
  Filled 2014-02-05: qty 10

## 2014-02-05 MED ORDER — LEVETIRACETAM 500 MG PO TABS
500.0000 mg | ORAL_TABLET | Freq: Two times a day (BID) | ORAL | Status: DC
  Administered 2014-02-05 – 2014-02-06 (×3): 500 mg via ORAL
  Filled 2014-02-05 (×4): qty 1

## 2014-02-05 MED ORDER — RITONAVIR 100 MG PO TABS
100.0000 mg | ORAL_TABLET | Freq: Every day | ORAL | Status: DC
  Administered 2014-02-05 – 2014-02-06 (×2): 100 mg via ORAL
  Filled 2014-02-05 (×3): qty 1

## 2014-02-05 MED ORDER — ENOXAPARIN SODIUM 40 MG/0.4ML ~~LOC~~ SOLN
40.0000 mg | SUBCUTANEOUS | Status: DC
  Administered 2014-02-05: 40 mg via SUBCUTANEOUS
  Filled 2014-02-05 (×2): qty 0.4

## 2014-02-05 MED ORDER — DEXTROSE 5 % IV SOLN
1.0000 g | INTRAVENOUS | Status: DC
  Administered 2014-02-05 – 2014-02-06 (×2): 1 g via INTRAVENOUS
  Filled 2014-02-05 (×4): qty 10

## 2014-02-05 MED ORDER — GADOBENATE DIMEGLUMINE 529 MG/ML IV SOLN
15.0000 mL | Freq: Once | INTRAVENOUS | Status: AC | PRN
  Administered 2014-02-05: 15 mL via INTRAVENOUS

## 2014-02-05 MED ORDER — DARUNAVIR ETHANOLATE 800 MG PO TABS
800.0000 mg | ORAL_TABLET | Freq: Every day | ORAL | Status: DC
  Administered 2014-02-05 – 2014-02-06 (×2): 800 mg via ORAL
  Filled 2014-02-05 (×3): qty 1

## 2014-02-05 NOTE — H&P (Signed)
Date: 02/05/2014               Patient Name:  [name] MRN: [id number]  DOB: 04/01/1971 Age / Sex: 43 y.o., female   PCP: Kennis CarinaEjiroghene Treson Laura, MD         Medical Service: Internal Medicine Teaching Service         Attending Physician: Dr. Burns SpainElizabeth A Butcher, MD    First Contact: Dr. Mikey BussingHoffman Pager: [phone]  Second Contact: Dr. Virgina OrganQureshi Pager: [phone]       After Hours (After 5p/  First Contact Pager: [phone]  weekends / holidays): Second Contact Pager: [phone]   Chief Complaint: Seizures  History of Present Illness: 43 Y O F with PMH of HIV, Depresion, [facility] to [facility] with c/o of new onset seizures, [facility]. Pt was sleeping when we attempted to get Hx at 2.30am, was difficult to arouse and kept falling asleep. No family was present. As per documentation in ED physicians notes- Hx gotten from husband, Pt had a Grand mal seizure, lasting 2-3 mins, prior to this Pt was complaining of generalised weakness and cold, pt later was more conscious and endorsed waking up with a headache that morning.  Meds: Prescriptions prior to admission  Medication Sig Dispense Refill  . Darunavir Ethanolate (PREZISTA) 800 MG tablet Take 1 tablet (800 mg total) by mouth daily.  90 tablet  4  . emtricitabine-tenofovir (TRUVADA) 200-300 MG per tablet Take 1 tablet by mouth daily.  90 tablet  4  . PRESCRIPTION MEDICATION Take 0.5 tablets by mouth daily. For Diabetes.      . ritonavir (NORVIR) 100 MG capsule Take 100 mg by mouth daily with breakfast.        Allergies: Allergies as of 02/04/2014  . (No Known Allergies)   Past Medical History  Diagnosis Date  . Abnormal Pap smear   . Depression   . HIV disease   . Back pain    History reviewed. No pertinent past surgical history. History reviewed. No pertinent family history. History   Social History  . Marital Status: Married    Spouse Name: N/A    Number of Children: N/A  . Years of Education:  N/A   Occupational History  . Not on file.   Social History Main Topics  . Smoking status: Never Smoker   . Smokeless tobacco: Never Used  . Alcohol Use: No  . Drug Use: No  . Sexual Activity: Not Currently    Partners: Male    Birth Control/ Protection: None     Comment: pt. given condoms   Other Topics Concern  . Not on file   Social History Narrative  . No narrative on file    Review of Systems: Could not be ascertained as Pt kept drifting back to sleep.  Physical Exam: Blood pressure 117/71, pulse 96, temperature 98.9 F (37.2 C), temperature source Oral, resp. rate 20, height 5\' 3"  (1.6 m), weight 153 lb 12.8 oz (69.763 kg), SpO2 99.00%. GENERAL- Sleeping, difficult to arouse. HEENT- PERRL, bruising on bottom lip, no cervical LN enlargement, thyroid does not appear enlarged. CARDIAC- RRR, no murmurs, rubs or gallops. RESP- Auscultation anteriorly- Moving equal volumes of air, and clear to auscultation bilaterally. ABDOMEN- Soft, nontender, no palpable masses or organomegaly, bowel sounds present. NEURO- Could not be ascertained. EXTREMITIES- pulse 2+, symmetric, +1 pitting pedal edema.Marland Kitchen. SKIN- Warm, dry, No rash or lesion.  Lab results: Basic Metabolic Panel:  Recent Labs  02/04/14 1640 02/04/14 2118  NA 140 136*  K 3.6* 4.8  CL 101 106  CO2 12* 17*  GLUCOSE 144* 93  BUN 13 11  CREATININE 1.04 0.85  CALCIUM 10.5 9.7   Liver Function Tests:  Recent Labs  02/04/14 1640  AST 31  ALT 29  ALKPHOS 99  BILITOT 0.4  PROT 9.6*  ALBUMIN 4.4   CBC:  Recent Labs  02/04/14 1640  WBC 10.9*  NEUTROABS 2.2  HGB 14.1  HCT 40.6  MCV 96.0  PLT 258   Coagulation:  Recent Labs  02/04/14 1640  LABPROT 14.3  INR 1.13   Urine Drug Screen: Drugs of Abuse     Component Value Date/Time   LABOPIA NONE DETECTED 02/04/2014 1803   COCAINSCRNUR NONE DETECTED 02/04/2014 1803   LABBENZ NONE DETECTED 02/04/2014 1803   AMPHETMU NONE DETECTED 02/04/2014 1803   THCU  NONE DETECTED 02/04/2014 1803   LABBARB NONE DETECTED 02/04/2014 1803    Alcohol Level:  Recent Labs  02/04/14 1640  ETH <11   Urinalysis:  Recent Labs  02/04/14 1803  COLORURINE YELLOW  LABSPEC 1.020  PHURINE 6.0  GLUCOSEU NEGATIVE  HGBUR SMALL*  BILIRUBINUR NEGATIVE  KETONESUR NEGATIVE  PROTEINUR 30*  UROBILINOGEN 1.0  NITRITE POSITIVE*  LEUKOCYTESUR NEGATIVE   Misc. Labs: CSF results- Tube 1- Colourless, RBC- 1, WBC- 9 Tube 4- Colourless, RBC- 1, WBC- 8 Total Protein- 6- Normal. Glucose- 61. Results for MADHURI, VACCA (MRN [id number]) as of 02/05/2014 07:56  Ref. Range 02/04/2014 20:24 02/04/2014 20:24  Glucose, CSF Latest Range: 43-76 mg/dL 61   Total  Protein, CSF Latest Range: 15-45 mg/dL 62 (H)   RBC Count, CSF Latest Range: 0 /cu mm 1 (H) 1 (H)  WBC, CSF Latest Range: 0-5 /cu mm 8 (H) 9 (H)  Segmented Neutrophils-CSF Latest Range: 0-6 %    Lymphs, CSF Latest Range: 40-80 %    Monocyte-Macrophage-Spinal Fluid Latest Range: 15-45 %    Other Cells, CSF No range found TOO FEW TO COUNT, SMEAR AVAILABLE FOR REVIEW TOO FEW TO COUNT, SMEAR AVAILABLE FOR REVIEW  Appearance, CSF Latest Range: CLEAR  CLEAR CLEAR  Color, CSF Latest Range: COLORLESS  COLORLESS COLORLESS  Supernatant No range found NOT INDICATED NOT INDICATED  Tube # No range found tube 4 1      Imaging results:  Ct Head Wo Contrast  02/04/2014   CLINICAL DATA:  New onset of seizures. Altered mental status. History of HIV.  EXAM: CT HEAD WITHOUT CONTRAST  TECHNIQUE: Contiguous axial images were obtained from the base of the skull through the vertex without intravenous contrast.  COMPARISON:  CT scan dated 08/03/2011 and MRI dated 08/03/2011  FINDINGS: There is no acute intracranial hemorrhage, mass lesion, or infarction. There is diffuse cerebral cortical atrophy and mild cerebellar atrophy. There is extensive irregular periventricular white matter lucency with focal areas of more prominent white matter lucency high in  the right parietal region and in the left frontal lobe which correlate with abnormalities seen on the prior MRI which were thought to be atypical infection.  There is no osseous abnormality.  IMPRESSION: No acute intracranial abnormality. No change since the brain MRI dated 08/03/2011. Chronic white matter disease. The MRI of 08/03/2011 demonstrated probable atypical infection in the brain.   Electronically Signed   By: Geanie Cooley M.D.   On: 02/04/2014 17:48   Dg Chest Port 1 View  02/04/2014   CLINICAL DATA:  Altered mental status.  EXAM: PORTABLE  CHEST - 1 VIEW  COMPARISON:  12/29/2013  FINDINGS: The patient unable to raise head as head and chin overlying the medial lung apices/upper mediastinum. Lungs are adequately inflated without focal consolidation or effusion. The cardiomediastinal silhouette and remainder of the exam is unchanged.  IMPRESSION: No active disease.   Electronically Signed   By: Elberta Fortis M.D.   On: 02/04/2014 17:15    Other results: EKG: T wave abnormalities in lead 2, flattening in lead III and AVF. Not present in prior 08/03/2011.,   Assessment & Plan by Problem: Principal Problem:   Seizure Active Problems:   HIV INFECTION   History of JC encephalitis, 3986  43 year old woman, with past medical history of HIV, and JC virus induced immune reconstitution syndrome in 2012 who presents with history of seizures, which started on the day of admission. No history of fevers. CSF analysis reveals white blood cell counts of 9 and slightly increased protein otherwise normal.   # First time Seizures: Etiology of patient's seizures is unclear, but the viral- Viral meningitis, etiology is likely given CSF findings with white blood cell counts of 9 and increased protein, with normal glucose. Bacterial meningitis unlikely- No fever, neck stiffness, CSF findings atypical . Of note has a past medical history of JC virus induced immune reconstitution syndrome in 2012. CSF was PCR  positive for JC virus at the time. Progressive multifocal Leukoencephalopathy was also considered at the time. Today's head CT scan revealed chronic white matter disease. The MRI of 08/03/2011 demonstrated probable atypical infection in the brain. Given her immunosuppression etiologies including TB meningitis, and fungal meningitis should be considered- But again CSF findings atypical for these. Neurologic exam non-focal. WBC- 10.9, possibly a stress reaction. She has been evaluated by neurology who have recommended treatment with Keppra and MRI for further assessment. Received a loading dose of  keppra -1000 mg IV from the emergency department and 1L of n/s bolus.  Plan.  - admit to med-surg bed  - Continue with po Keppra 500mg  twice a day.  - CSF sent for CMV, varicella-zoster and HCV virus. Other testing to be deferred to ID  - CSF cultures pending  - Seizure precautions - Brain MRI with and without contrast as recommended by neurology  - EEG  - Mag and phosphorus levels. - Neuro checks every 4 hours  - Infectious disease will be consulted for further evaluation of the patient and possibly etiologies.   # UTI- History inadequate- so UTI symptoms could not be ascertained, but UA positive for nitrite and Many bacteria, but also positive for many squamous epithelia cells. Was started on 1g of rocephin in the Ed. - Continue IV rocephin - Follow up on Urine cultures.  # EKG abnormalities- T wave abnormalities in lead 2, flattening in lead III and AVF. Not present in prior 08/03/2011. I stat troponin negative.  - Troponin X1 - Repeat EKG in the Am.  # HIV: Last CD4 count to 12/04/2013 was 220 and the viral load of <20.  Home meds- Prezista and Truvada and Ritonavir. - Continue with home medications.    Dispo: Disposition is deferred at this time, awaiting improvement of current medical problems.   The patient does have a current PCP (Kennis Carina, MD) and does need an [facility] hospital  follow-up appointment after discharge.  The patient does not know have transportation limitations that hinder transportation to clinic appointments.  Signed: Kennis Carina, MD 02/05/2014, 5:43 AM

## 2014-02-05 NOTE — Progress Notes (Signed)
Subjective: Patient reports she feels much better today, she has does not a mild sore throat. She is fully oriented and interactive. She reports that she remembers feeling usual yesterday when out at the store but cannot remember many details.  Upon asking if she has ever felt like this before she notes that she had a similar episode 1 year ago when visiting her home country of [place], she reports she was taken to the hospital and treated for a seizure but does not think she was given any medications.  She does admit a few day history of dysuria, increased urinary frequency,back pain, subjective fever and chills. Objective: Vital signs in last 24 hours: Filed Vitals:   02/04/14 2125 02/05/14 0049 02/05/14 0155 02/05/14 0528  BP: 114/60 107/62 113/69 117/71  Pulse: 83 86 78 96  Temp: 97.6 F (36.4 C) 98.2 F (36.8 C) 98.9 F (37.2 C)   TempSrc: Oral Oral Oral   Resp:  18 18 20   Height:   5\' 3"  (1.6 m)   Weight:   153 lb 12.8 oz (69.763 kg)   SpO2: 98% 98% 100% 99%   Weight change:  No intake or output data in the 24 hours ending 02/05/14 0913 General: resting in bed HEENT: PERRL, EOMI, no LAD appreciated  Cardiac: RRR, no murmurs Pulm: clear to auscultation bilaterally, moving normal volumes of air Abd: soft, nontender, nondistended, BS present, Mild CVA tenderness on the right Ext: warm and well perfused, no pedal edema Neuro: alert and oriented X3, cranial nerves II-XII grossly intact  Lab Results: Basic Metabolic Panel:  Recent Labs Lab 02/04/14 1640 02/04/14 2118  NA 140 136*  K 3.6* 4.8  CL 101 106  CO2 12* 17*  GLUCOSE 144* 93  BUN 13 11  CREATININE 1.04 0.85  CALCIUM 10.5 9.7   Liver Function Tests:  Recent Labs Lab 02/04/14 1640  AST 31  ALT 29  ALKPHOS 99  BILITOT 0.4  PROT 9.6*  ALBUMIN 4.4   CBC:  Recent Labs Lab 02/04/14 1640  WBC 10.9*  NEUTROABS 2.2  HGB 14.1  HCT 40.6  MCV 96.0  PLT 258   Coagulation:  Recent Labs Lab  02/04/14 1640  LABPROT 14.3  INR 1.13   Urine Drug Screen: Drugs of Abuse     Component Value Date/Time   LABOPIA NONE DETECTED 02/04/2014 1803   COCAINSCRNUR NONE DETECTED 02/04/2014 1803   LABBENZ NONE DETECTED 02/04/2014 1803   AMPHETMU NONE DETECTED 02/04/2014 1803   THCU NONE DETECTED 02/04/2014 1803   LABBARB NONE DETECTED 02/04/2014 1803    Alcohol Level:  Recent Labs Lab 02/04/14 1640  ETH <11   Urinalysis:  Recent Labs Lab 02/04/14 1803  COLORURINE YELLOW  LABSPEC 1.020  PHURINE 6.0  GLUCOSEU NEGATIVE  HGBUR SMALL*  BILIRUBINUR NEGATIVE  KETONESUR NEGATIVE  PROTEINUR 30*  UROBILINOGEN 1.0  NITRITE POSITIVE*  LEUKOCYTESUR NEGATIVE    Micro Results: Recent Results (from the past 240 hour(s))  CSF CULTURE     Status: None   Collection Time    02/04/14  8:24 PM      Result Value Ref Range Status   Specimen Description CSF   Final   Special Requests NONE   Final   Gram Stain     Final   Value: CYTOSPIN PREP WBC PRESENT, PREDOMINANTLY MONONUCLEAR     NO ORGANISMS SEEN     Gram Stain Report Called to,Read Back By and Verified With: Gram Stain Report Called to,Read Back  By and Verified With: DUDLEY F/ED @2226  ON 02/04/14 BY KARCZEWSKI S. Performed by [facility]     Performed at [facility]   Culture PENDING   Incomplete   Report Status PENDING   Incomplete  GRAM STAIN     Status: None   Collection Time    02/04/14  8:24 PM      Result Value Ref Range Status   Specimen Description CSF   Final   Special Requests NONE   Final   Gram Stain     Final   Value: NO ORGANISMS SEEN     WBC PRESENT, PREDOMINANTLY MONONUCLEAR     CYTOSPIN PREP     Gram Stain Report Called to,Read Back By and Verified With: DUDLEY,F/ED @2226  ON 02/04/14 BY KARCZEWSKI,S.   Report Status 02/04/2014 FINAL   Final   Studies/Results: Ct Head Wo Contrast  02/04/2014   CLINICAL DATA:  New onset of seizures. Altered mental status. History of HIV.  EXAM: CT HEAD WITHOUT  CONTRAST  TECHNIQUE: Contiguous axial images were obtained from the base of the skull through the vertex without intravenous contrast.  COMPARISON:  CT scan dated 08/03/2011 and MRI dated 08/03/2011  FINDINGS: There is no acute intracranial hemorrhage, mass lesion, or infarction. There is diffuse cerebral cortical atrophy and mild cerebellar atrophy. There is extensive irregular periventricular white matter lucency with focal areas of more prominent white matter lucency high in the right parietal region and in the left frontal lobe which correlate with abnormalities seen on the prior MRI which were thought to be atypical infection.  There is no osseous abnormality.  IMPRESSION: No acute intracranial abnormality. No change since the brain MRI dated 08/03/2011. Chronic white matter disease. The MRI of 08/03/2011 demonstrated probable atypical infection in the brain.   Electronically Signed   By: Geanie Cooley M.D.   On: 02/04/2014 17:48   Dg Chest Port 1 View  02/04/2014   CLINICAL DATA:  Altered mental status.  EXAM: PORTABLE CHEST - 1 VIEW  COMPARISON:  12/29/2013  FINDINGS: The patient unable to raise head as head and chin overlying the medial lung apices/upper mediastinum. Lungs are adequately inflated without focal consolidation or effusion. The cardiomediastinal silhouette and remainder of the exam is unchanged.  IMPRESSION: No active disease.   Electronically Signed   By: Elberta Fortis M.D.   On: 02/04/2014 17:15   Dg Fluoro Guide Lumbar Puncture  02/05/2014   CLINICAL DATA:  Seizure.  HIV.  EXAM: DIAGNOSTIC LUMBAR PUNCTURE UNDER FLUOROSCOPIC GUIDANCE  FLUOROSCOPY TIME:  0 min 21 seconds  PROCEDURE: Informed consent was obtained from the patient prior to the procedure, including potential complications of headache, allergy, and pain. With the patient prone, the lower back was prepped with Betadine. 1% Lidocaine was used for local anesthesia. Lumbar puncture was performed at the L3-4 level using a 21 gauge  needle with return of clear colorless CSF with an opening pressure of 14 cm water. Nineteenml of CSF were obtained for laboratory studies. The patient tolerated the procedure well and there were no apparent complications.  IMPRESSION: Lumbar puncture performed without complication.   Electronically Signed   By: Geanie Cooley M.D.   On: 02/05/2014 08:04   Medications: I have reviewed the patient's current medications. Scheduled Meds: . cefTRIAXone (ROCEPHIN)  IV  1 g Intravenous Q24H  . Darunavir Ethanolate  800 mg Oral Q breakfast  . emtricitabine-tenofovir  1 tablet Oral Daily  . levETIRAcetam  500 mg Oral  BID  . ritonavir  100 mg Oral Q breakfast   Continuous Infusions:  PRN Meds:. Assessment/Plan:    Grand Mal Seizure This may not have been the first seizure patient has had, she may have had a seizure last year.  She does have atypical CSF findings, and abnormal CT of the head from her JC virus immune reconstitution syndrome in 2012.  The etiology of her seizure may be abnormal foci due to changes from 2012. -Continue Keppra 500mg  BID - Follow CSF cultures, labs  (HSV, CMV, VZV pending) - ID consulted for possible other infectious etiology given HIV - Appreciate neurology recs    HIV INFECTION Last CD4 count to 12/04/2013 was 220 and the viral load of <20. -Continue home medication Prezista, Truvada, Ritonavir   UTI - U/A of poor quality, has received 1 dose of ceftriaxone.  She does report some increased urinary frequency and dysuria. - Continue Rocephin   Hypokalemia- resolved.  Increased Anion gap metabolic acidosis- resolving -Patient presented with AG of 17 and bicarb of 12.  Likely due to lactic acidosis from grand mal seizures. AG is down to 13 this AM and bicarb improved to 17.  - Patient already received 1L IVF, currently tolerating diet. -Continue to monitor  Dispo: Disposition is deferred at this time, awaiting improvement of current medical problems.  Anticipated  discharge in approximately 2 day(s).   The patient does have a current PCP (Kennis CarinaEjiroghene Emokpae, MD) and [facility] after discharge.  The patient does not have transportation limitations that hinder transportation to clinic appointments.  .Services Needed at time of discharge: Y = Yes, Blank = No PT:   OT:   RN:   Equipment:   Other:     LOS: 1 day   Carlynn PurlErik Hoffman, DO 02/05/2014, 9:13 AM

## 2014-02-05 NOTE — H&P (Signed)
  Date: 02/05/2014  Patient name: [name]  Medical record number: [id number]  Date of birth: 10/12/1971   I have seen and evaluated [name] and discussed their care with the Residency Team. Ms [name] is a 43 yo with well controlled HIV (undetectable viral load, CD4 220) who presented with a sz. This is not her first szs as she had one in her home [place] that required admission to hospital, unknown W/U, and no meds at D/C.   She had an admission in 2012 for Altered mental status, likely due to JC virus-induced immune reconstitution syndrome.  Dr Luciana Axeomer has been working up her edema. [facility] has no [facility]. Her UA in Feb showed 100 protein, now 30 protein. She thinks the edema and weight gain was related to Mepron which she has stopped. She has since lost 14 lbs.   Assessment and Plan: I have seen and evaluated the patient as outlined above. I agree with the formulated Assessment and Plan as detailed in the residents' admission note, with the following changes:   1. Sz - LP and CT and EEG have shown no etiology. She has been loaded with Keppra and cont on PO dose. No further szs since. Neuro and ID consulted.   2. UTI - pt has soreness with swallowing so cont the Rocephin until cxs returned.   3. HIV - cont home meds.  Burns SpainElizabeth A Bernell Sigal, MD 4/6/20152:42 PM

## 2014-02-05 NOTE — Progress Notes (Signed)
EEG Completed; Results Pending  

## 2014-02-05 NOTE — Progress Notes (Signed)
Utilization Review Completed.[name] T4/04/2014  

## 2014-02-05 NOTE — Consult Note (Signed)
Neurology Consultation Reason for Consult: Seizure Referring Physician: Purvis SheffieldHarrison, Forrest  CC: Seizure  History is obtained from: Patient  HPI: [name] is a 43 y.o. female with a history of HIV as well as a previous episode of prolonged encephalopathy in 2012 who presents with a new onset focal seizure. She was shopping when she had onset of nausea and generalized weak feeling. This lasted for approximately 30 - 45 minutes and her husband was bringing her to the hospital when she had a seizure. He describes a blank stare followed by right head turning and generalized stiffening. This lasted for 1 - 2 minutes. She was then initially unresponsive and subsequently improved back to her baseline over 15 - 20 minutes.   She currently states that she is feeling much better. Because of her immunocompromised status, she had an LP which demonstrates 9 WBC and elevated protein.   Of note, she had an undetectable viral load in February.   LKW: 4pm tpa given?: no, seizure    ROS: A 14 point ROS was performed and is negative except as noted in the HPI.  Past Medical History  Diagnosis Date  . Abnormal Pap smear   . Depression   . HIV disease   . Back pain     Family History: No hx similar  Social History: Tob: denies  etoh denies  Exam: Current vital signs: BP 114/60  Pulse 83  Temp(Src) 97.6 F (36.4 C) (Oral)  Resp 19  SpO2 98% Vital signs in last 24 hours: Temp:  [97.6 F (36.4 C)-97.9 F (36.6 C)] 97.6 F (36.4 C) (04/05 2125) Pulse Rate:  [83-100] 83 (04/05 2125) Resp:  [17-20] 19 (04/05 1915) BP: (98-121)/(50-73) 114/60 mmHg (04/05 2125) SpO2:  [96 %-100 %] 98 % (04/05 2125)  General: in bed, NAD CV: RRR Mental Status: Patient is awake, alert, oriented to person, place, month, but gives year as 2014 Immediate and remote memory are intact. No clear signs of aphasia or neglect, though slightly slow to respond at times Cranial Nerves: II: Visual Fields are notable  for left lower field cut. Pupils are equal, round, and reactive to light.  Discs are difficult to visualize. III,IV, VI: EOMI without ptosis or diploplia.  V: Facial sensation is symmetric to temperature VII: Facial movement is symmetric.  VIII: hearing is intact to voice X: Uvula elevates symmetrically XI: Shoulder shrug is symmetric. XII: tongue is midline without atrophy or fasciculations.  Motor: Tone is normal. Bulk is normal. 5/5 strength was present in all four extremities.  Sensory: Sensation is symmetric to light touch and temperature in the arms and legs. Deep Tendon Reflexes: 2+ and symmetric in the biceps and patellae.  Plantars: Toes are downgoing bilaterally.  Cerebellar: FNF with mild intentional tremor bilaterally, R > L Gait: Not assessed due to multiple medical monitors in ED setting.   I have reviewed labs in epic and the results pertinent to this consultation are: CSF elevated protein and WBC  I have reviewed the images obtained:CT head - white matter hypodensities  Impression: 43 yo F with previously known abnormal brain imaging and new onset focal seizure. In this setting, I think that she is at high risk of recurrence and would favor starting anti-epileptic therapy. With return to baseline and no fever, I do not think that acute encephalitis is likely, though PCR is pending for this.   Her CSF findings could certainly be solely due to her HIV infection, and I suspect this is the case,  but if she were to spike a fever or become acutely more confused, would start acyclovir at that time.   Imaging to rule out mass lesions not well appreciated on CT would also be prudent.   Recommendations: 1) Would discuss her case with infectious disease in the morning.  2) Keppra 500mg  BID 3) VZV, HSV pending 4) MRI brain w/wo contrast.   5) EEG.    Ritta Slot, MD [facility] [phone]  If 7pm- 7am, please page neurology on call as listed in  AMION.

## 2014-02-05 NOTE — Procedures (Signed)
ELECTROENCEPHALOGRAM REPORT  Patient: [name]       Room #: 4N11 EEG No. ID: [id number] Age: 43 y.o.        Sex: female Referring Physician: Rogelia BogaButcher Report Date:  02/05/2014        Interpreting Physician: Aline BrochureSTEWART,Lidie Glade R  History: [name] is an 43 y.o. female history of HIV infection and previous evaluation for encephalopathy and 2012 who experienced new onset of focal seizure on 02/04/2014.  Indications for study:  Rule out new-onset focal seizure disorder.  Technique: This is an 18 channel routine scalp EEG performed at the bedside with bipolar and monopolar montages arranged in accordance to the international 10/20 system of electrode placement.   Description:this EEG recording was performed during wakefulness and during sleep. Comment background activity during wakefulness consisted of 9 Hz symmetrical alpha rhythm. Photic stimulation was not performed. Hyperventilation was not performed. There was generalized slowing of background activity symmetrically with sleep. During stage II of sleep symmetrical vertex waves, sleep spindles and arousal responses were recorded. No epileptiform discharges recorded. There was no abnormal slowing of cerebral activity.  Interpretation: this is a normal EEG recording during wakefulness during sleep. No evidence of an epileptic disorder is demonstrated.   Venetia MaxonR Gladie Gravette M.D. [facility] [phone]

## 2014-02-05 NOTE — Consult Note (Signed)
[facility]    Date of Admission:  02/04/2014  Date of Consult:  02/05/2014  Reason for Consult: new onset seizures in HIV infected patient Referring Physician: Dr. Lynnae January   HPI: [name] is an 43 y.o. 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  She has actually done a superlative job being highly compliant with her ARV regimen of Prezista, Norvir and Truvada with VL <20 and CD4 above 200 when checked in February and near complete adn perfect virological suppression going back to 2012.  She apparently had a Grand mal seizure yesterday and was admitted. She underwent LP which only showed 9 WBC minimally elevated protein, and normal CSF glucose 62 .  She is growing >100K GNR in urine though not clear that she had large amount of symptoms.   Past Medical History  Diagnosis Date  . Abnormal Pap smear   . Depression   . HIV disease   . Back pain     History reviewed. No pertinent past surgical history.ergies:   No Known Allergies   Medications: I have reviewed patients current medications as documented in Epic Anti-infectives   Start     Dose/Rate Route Frequency Ordered Stop   02/05/14 1000  emtricitabine-tenofovir (TRUVADA) 200-300 MG per tablet 1 tablet     1 tablet Oral Daily 02/05/14 0239     02/05/14 0800  ritonavir (NORVIR) tablet 100 mg     100 mg Oral Daily with breakfast 02/05/14 0239     02/05/14 0800  Darunavir Ethanolate (PREZISTA) tablet 800 mg     800 mg Oral Daily with breakfast 02/05/14 0239     02/05/14 0700  cefTRIAXone (ROCEPHIN) 1 g in dextrose 5 % 50 mL IVPB     1 g 100 mL/hr over 30 Minutes Intravenous Every 24 hours 02/05/14 0652     02/05/14 0000  cefTRIAXone (ROCEPHIN) 1 g in dextrose 5 % 50 mL IVPB  Status:  Discontinued     1 g 100 mL/hr over 30 Minutes Intravenous  Once 02/05/14 0000 02/05/14 0239       Social History:  reports that she has never smoked. She has never used smokeless tobacco. She reports that she does not drink alcohol or use illicit drugs.  History reviewed. No pertinent family history.  As in HPI and primary teams notes otherwise 12 point review of systems is negative  Blood pressure 101/79, pulse 90, temperature 98.5 F (36.9 C), temperature source Oral, resp. rate 20, height 5' 3"  (1.6 m), weight 153 lb 12.8 oz (69.763 kg), SpO2 100.00%. General: Alert and awake, oriented x3, not in any acute distress. HEENT: anicteric sclera, pupils reactive to light and accommodation, EOMI, oropharynx clear and without exudate CVS regular rate, normal r,  no murmur rubs or gallops Chest: clear to auscultation bilaterally, no wheezing, rales or rhonchi Abdomen: soft nontender, nondistended, normal bowel sounds, Skin: no rashes Neuro: nonfocal, strength and sensation intact   Results for orders placed during the hospital encounter of 02/04/14 (from the past 48 hour(s))  CBG MONITORING, ED     Status: Abnormal   Collection Time    02/04/14  4:39 PM      Result Value Ref Range   Glucose-Capillary 134 (*) 70 - 99 mg/dL   Comment 1 Orig Pt Id entered as 0000    CBC WITH DIFFERENTIAL  Status: Abnormal   Collection Time    02/04/14  4:40 PM      Result Value Ref Range   WBC 10.9 (*) 4.0 - 10.5 K/uL   RBC 4.23  3.87 - 5.11 MIL/uL   Hemoglobin 14.1  12.0 - 15.0 g/dL   HCT 40.6  36.0 - 46.0 %   MCV 96.0  78.0 - 100.0 fL   MCH 33.3  26.0 - 34.0 pg   MCHC 34.7  30.0 - 36.0 g/dL   RDW 12.7  11.5 - 15.5 %   Platelets 258  150 - 400 K/uL   Neutrophils Relative % 20 (*) 43 - 77 %   Lymphocytes Relative 65 (*) 12 - 46 %   Monocytes Relative 9  3 - 12 %   Eosinophils Relative 6 (*) 0 - 5 %   Basophils Relative 0  0 - 1 %   Neutro Abs 2.2  1.7 - 7.7 K/uL   Lymphs Abs 7.0 (*) 0.7 - 4.0 K/uL   Monocytes Absolute 1.0  0.1 - 1.0 K/uL   Eosinophils Absolute 0.7  0.0 - 0.7 K/uL    Basophils Absolute 0.0  0.0 - 0.1 K/uL   WBC Morphology ABSOLUTE LYMPHOCYTOSIS     Comment: ATYPICAL LYMPHOCYTES  COMPREHENSIVE METABOLIC PANEL     Status: Abnormal   Collection Time    02/04/14  4:40 PM      Result Value Ref Range   Sodium 140  137 - 147 mEq/L   Potassium 3.6 (*) 3.7 - 5.3 mEq/L   Chloride 101  96 - 112 mEq/L   CO2 12 (*) 19 - 32 mEq/L   Glucose, Bld 144 (*) 70 - 99 mg/dL   BUN 13  6 - 23 mg/dL   Creatinine, Ser 1.04  0.50 - 1.10 mg/dL   Calcium 10.5  8.4 - 10.5 mg/dL   Total Protein 9.6 (*) 6.0 - 8.3 g/dL   Albumin 4.4  3.5 - 5.2 g/dL   AST 31  0 - 37 U/L   ALT 29  0 - 35 U/L   Alkaline Phosphatase 99  39 - 117 U/L   Total Bilirubin 0.4  0.3 - 1.2 mg/dL   GFR calc non Af Amer 66 (*) >90 mL/min   GFR calc Af Amer 76 (*) >90 mL/min   Comment: (NOTE)     The eGFR has been calculated using the CKD EPI equation.     This calculation has not been validated in all clinical situations.     eGFR's persistently <90 mL/min signify possible Chronic Kidney     Disease.  ACETAMINOPHEN LEVEL     Status: None   Collection Time    02/04/14  4:40 PM      Result Value Ref Range   Acetaminophen (Tylenol), Serum <15.0  10 - 30 ug/mL   Comment:            THERAPEUTIC CONCENTRATIONS VARY     SIGNIFICANTLY. A RANGE OF 10-30     ug/mL MAY BE AN EFFECTIVE     CONCENTRATION FOR MANY PATIENTS.     HOWEVER, SOME ARE BEST TREATED     AT CONCENTRATIONS OUTSIDE THIS     RANGE.     ACETAMINOPHEN CONCENTRATIONS     >150 ug/mL AT 4 HOURS AFTER     INGESTION AND >50 ug/mL AT 12     HOURS AFTER INGESTION ARE     OFTEN ASSOCIATED WITH TOXIC  REACTIONS.  SALICYLATE LEVEL     Status: Abnormal   Collection Time    02/04/14  4:40 PM      Result Value Ref Range   Salicylate Lvl <2.7 (*) 2.8 - 20.0 mg/dL  ETHANOL     Status: None   Collection Time    02/04/14  4:40 PM      Result Value Ref Range   Alcohol, Ethyl (B) <11  0 - 11 mg/dL   Comment:            LOWEST DETECTABLE LIMIT  FOR     SERUM ALCOHOL IS 11 mg/dL     FOR MEDICAL PURPOSES ONLY  PROTIME-INR     Status: None   Collection Time    02/04/14  4:40 PM      Result Value Ref Range   Prothrombin Time 14.3  11.6 - 15.2 seconds   INR 1.13  0.00 - 1.49  URINE RAPID DRUG SCREEN (HOSP PERFORMED)     Status: None   Collection Time    02/04/14  6:03 PM      Result Value Ref Range   Opiates NONE DETECTED  NONE DETECTED   Cocaine NONE DETECTED  NONE DETECTED   Benzodiazepines NONE DETECTED  NONE DETECTED   Amphetamines NONE DETECTED  NONE DETECTED   Tetrahydrocannabinol NONE DETECTED  NONE DETECTED   Barbiturates NONE DETECTED  NONE DETECTED   Comment:            DRUG SCREEN FOR MEDICAL PURPOSES     ONLY.  IF CONFIRMATION IS NEEDED     FOR ANY PURPOSE, NOTIFY LAB     WITHIN 5 DAYS.                LOWEST DETECTABLE LIMITS     FOR URINE DRUG SCREEN     Drug Class       Cutoff (ng/mL)     Amphetamine      1000     Barbiturate      200     Benzodiazepine   253     Tricyclics       664     Opiates          300     Cocaine          300     THC              50  URINE CULTURE     Status: None   Collection Time    02/04/14  6:03 PM      Result Value Ref Range   Specimen Description URINE, CLEAN CATCH     Special Requests NONE     Culture  Setup Time       Value: 02/04/2014 21:11     Performed at [facility]       Value: >=100,000 COLONIES/ML     Performed at [facility]   Culture       Value: ESCHERICHIA COLI     Performed at [facility]   Report Status PENDING    URINALYSIS, ROUTINE W REFLEX MICROSCOPIC     Status: Abnormal   Collection Time    02/04/14  6:03 PM      Result Value Ref Range   Color, Urine YELLOW  YELLOW   APPearance CLOUDY (*) CLEAR   Specific Gravity, Urine 1.020  1.005 - 1.030   pH 6.0  5.0 - 8.0  Glucose, UA NEGATIVE  NEGATIVE mg/dL   Hgb urine dipstick SMALL (*) NEGATIVE   Bilirubin Urine NEGATIVE  NEGATIVE   Ketones, ur NEGATIVE   NEGATIVE mg/dL   Protein, ur 30 (*) NEGATIVE mg/dL   Urobilinogen, UA 1.0  0.0 - 1.0 mg/dL   Nitrite POSITIVE (*) NEGATIVE   Leukocytes, UA NEGATIVE  NEGATIVE  URINE MICROSCOPIC-ADD ON     Status: Abnormal   Collection Time    02/04/14  6:03 PM      Result Value Ref Range   Squamous Epithelial / LPF MANY (*) RARE   WBC, UA 7-10  <3 WBC/hpf   Bacteria, UA MANY (*) RARE  POC URINE PREG, ED     Status: None   Collection Time    02/04/14  6:09 PM      Result Value Ref Range   Preg Test, Ur NEGATIVE  NEGATIVE   Comment:            THE SENSITIVITY OF THIS     METHODOLOGY IS >24 mIU/mL  CSF CULTURE     Status: None   Collection Time    02/04/14  8:24 PM      Result Value Ref Range   Specimen Description CSF     Special Requests NONE     Gram Stain       Value: CYTOSPIN PREP WBC PRESENT, PREDOMINANTLY MONONUCLEAR     NO ORGANISMS SEEN     Gram Stain Report Called to,Read Back By and Verified With: Gram Stain Report Called to,Read Back By and Verified With: DUDLEY F/ED @2226  ON 02/04/14 BY KARCZEWSKI S. Performed by [facility]     Performed at [facility]   Culture PENDING     Report Status PENDING    GRAM STAIN     Status: None   Collection Time    02/04/14  8:24 PM      Result Value Ref Range   Specimen Description CSF     Special Requests NONE     Gram Stain       Value: NO ORGANISMS SEEN     WBC PRESENT, PREDOMINANTLY MONONUCLEAR     CYTOSPIN PREP     Gram Stain Report Called to,Read Back By and Verified With: DUDLEY,F/ED @2226  ON 02/04/14 BY KARCZEWSKI,S.   Report Status 02/04/2014 FINAL    GLUCOSE, CSF     Status: None   Collection Time    02/04/14  8:24 PM      Result Value Ref Range   Glucose, CSF 61  43 - 76 mg/dL  PROTEIN, CSF     Status: Abnormal   Collection Time    02/04/14  8:24 PM      Result Value Ref Range   Total  Protein, CSF 62 (*) 15 - 45 mg/dL  CSF CELL COUNT WITH DIFFERENTIAL     Status: Abnormal   Collection Time    02/04/14   8:24 PM      Result Value Ref Range   Tube # 1     Color, CSF COLORLESS  COLORLESS   Appearance, CSF CLEAR  CLEAR   Supernatant NOT INDICATED     RBC Count, CSF 1 (*) 0 /cu mm   WBC, CSF 9 (*) 0 - 5 /cu mm   Other Cells, CSF TOO FEW TO COUNT, SMEAR AVAILABLE FOR REVIEW     Comment: FEW LYMPHOCYTES AND RARE MONOCYTES SEEN ON SMEAR  CSF CELL COUNT WITH DIFFERENTIAL  Status: Abnormal   Collection Time    02/04/14  8:24 PM      Result Value Ref Range   Tube # tube 4     Color, CSF COLORLESS  COLORLESS   Appearance, CSF CLEAR  CLEAR   Supernatant NOT INDICATED     RBC Count, CSF 1 (*) 0 /cu mm   WBC, CSF 8 (*) 0 - 5 /cu mm   Other Cells, CSF TOO FEW TO COUNT, SMEAR AVAILABLE FOR REVIEW     Comment: FEW LYMPHOCYTES AND RARE MONOCYTES SEEN ON SMEAR  BASIC METABOLIC PANEL     Status: Abnormal   Collection Time    02/04/14  9:18 PM      Result Value Ref Range   Sodium 136 (*) 137 - 147 mEq/L   Potassium 4.8  3.7 - 5.3 mEq/L   Comment: DELTA CHECK NOTED     REPEATED TO VERIFY     MODERATE HEMOLYSIS     HEMOLYSIS AT THIS LEVEL MAY AFFECT RESULT   Chloride 106  96 - 112 mEq/L   CO2 17 (*) 19 - 32 mEq/L   Glucose, Bld 93  70 - 99 mg/dL   BUN 11  6 - 23 mg/dL   Creatinine, Ser 0.85  0.50 - 1.10 mg/dL   Calcium 9.7  8.4 - 10.5 mg/dL   GFR calc non Af Amer 84 (*) >90 mL/min   GFR calc Af Amer >90  >90 mL/min   Comment: (NOTE)     The eGFR has been calculated using the CKD EPI equation.     This calculation has not been validated in all clinical situations.     eGFR's persistently <90 mL/min signify possible Chronic Kidney     Disease.  COMPREHENSIVE METABOLIC PANEL     Status: Abnormal   Collection Time    02/05/14 10:25 AM      Result Value Ref Range   Sodium 141  137 - 147 mEq/L   Potassium 3.4 (*) 3.7 - 5.3 mEq/L   Chloride 106  96 - 112 mEq/L   CO2 20  19 - 32 mEq/L   Glucose, Bld 121 (*) 70 - 99 mg/dL   BUN 8  6 - 23 mg/dL   Creatinine, Ser 1.03  0.50 - 1.10 mg/dL    Calcium 9.7  8.4 - 10.5 mg/dL   Total Protein 8.0  6.0 - 8.3 g/dL   Albumin 3.3 (*) 3.5 - 5.2 g/dL   AST 21  0 - 37 U/L   ALT 22  0 - 35 U/L   Alkaline Phosphatase 76  39 - 117 U/L   Total Bilirubin 0.3  0.3 - 1.2 mg/dL   GFR calc non Af Amer 67 (*) >90 mL/min   GFR calc Af Amer 77 (*) >90 mL/min   Comment: (NOTE)     The eGFR has been calculated using the CKD EPI equation.     This calculation has not been validated in all clinical situations.     eGFR's persistently <90 mL/min signify possible Chronic Kidney     Disease.  CBC     Status: None   Collection Time    02/05/14 10:25 AM      Result Value Ref Range   WBC 5.4  4.0 - 10.5 K/uL   RBC 3.88  3.87 - 5.11 MIL/uL   Hemoglobin 13.0  12.0 - 15.0 g/dL   HCT 36.6  36.0 - 46.0 %   MCV 94.3  78.0 - 100.0 fL   MCH 33.5  26.0 - 34.0 pg   MCHC 35.5  30.0 - 36.0 g/dL   RDW 12.7  11.5 - 15.5 %   Platelets 195  150 - 400 K/uL  PROTIME-INR     Status: None   Collection Time    02/05/14 10:25 AM      Result Value Ref Range   Prothrombin Time 14.1  11.6 - 15.2 seconds   INR 1.11  0.00 - 1.49  APTT     Status: None   Collection Time    02/05/14 10:25 AM      Result Value Ref Range   aPTT 26  24 - 37 seconds  MAGNESIUM     Status: None   Collection Time    02/05/14 10:25 AM      Result Value Ref Range   Magnesium 1.9  1.5 - 2.5 mg/dL  PHOSPHORUS     Status: None   Collection Time    02/05/14 10:25 AM      Result Value Ref Range   Phosphorus 2.4  2.3 - 4.6 mg/dL  TROPONIN I     Status: None   Collection Time    02/05/14 10:25 AM      Result Value Ref Range   Troponin I <0.30  <0.30 ng/mL   Comment:            Due to the release kinetics of cTnI,     a negative result within the first hours     of the onset of symptoms does not rule out     myocardial infarction with certainty.     If myocardial infarction is still suspected,     repeat the test at appropriate intervals.      Component Value Date/Time   SDES CSF  02/04/2014 2024   SDES CSF 02/04/2014 2024   SPECREQUEST NONE 02/04/2014 2024   SPECREQUEST NONE 02/04/2014 2024   CULT PENDING 02/04/2014 2024   REPTSTATUS PENDING 02/04/2014 2024   REPTSTATUS 02/04/2014 FINAL 02/04/2014 2024   Ct Head Wo Contrast  02/04/2014   CLINICAL DATA:  New onset of seizures. Altered mental status. History of HIV.  EXAM: CT HEAD WITHOUT CONTRAST  TECHNIQUE: Contiguous axial images were obtained from the base of the skull through the vertex without intravenous contrast.  COMPARISON:  CT scan dated 08/03/2011 and MRI dated 08/03/2011  FINDINGS: There is no acute intracranial hemorrhage, mass lesion, or infarction. There is diffuse cerebral cortical atrophy and mild cerebellar atrophy. There is extensive irregular periventricular white matter lucency with focal areas of more prominent white matter lucency high in the right parietal region and in the left frontal lobe which correlate with abnormalities seen on the prior MRI which were thought to be atypical infection.  There is no osseous abnormality.  IMPRESSION: No acute intracranial abnormality. No change since the brain MRI dated 08/03/2011. Chronic white matter disease. The MRI of 08/03/2011 demonstrated probable atypical infection in the brain.   Electronically Signed   By: Rozetta Nunnery M.D.   On: 02/04/2014 17:48   Dg Chest Port 1 View  02/04/2014   CLINICAL DATA:  Altered mental status.  EXAM: PORTABLE CHEST - 1 VIEW  COMPARISON:  12/29/2013  FINDINGS: The patient unable to raise head as head and chin overlying the medial lung apices/upper mediastinum. Lungs are adequately inflated without focal consolidation or effusion. The cardiomediastinal silhouette and remainder of the exam is unchanged.  IMPRESSION: No active  disease.   Electronically Signed   By: Marin Olp M.D.   On: 02/04/2014 17:15   Dg Fluoro Guide Lumbar Puncture  02/05/2014   CLINICAL DATA:  Seizure.  HIV.  EXAM: DIAGNOSTIC LUMBAR PUNCTURE UNDER FLUOROSCOPIC GUIDANCE   FLUOROSCOPY TIME:  0 min 21 seconds  PROCEDURE: Informed consent was obtained from the patient prior to the procedure, including potential complications of headache, allergy, and pain. With the patient prone, the lower back was prepped with Betadine. 1% Lidocaine was used for local anesthesia. Lumbar puncture was performed at the L3-4 level using a 21 gauge needle with return of clear colorless CSF with an opening pressure of 14 cm water. Nineteenml of CSF were obtained for laboratory studies. The patient tolerated the procedure well and there were no apparent complications.  IMPRESSION: Lumbar puncture performed without complication.   Electronically Signed   By: Rozetta Nunnery M.D.   On: 02/05/2014 08:04     Recent Results (from the past 720 hour(s))  URINE CULTURE     Status: None   Collection Time    02/04/14  6:03 PM      Result Value Ref Range Status   Specimen Description URINE, CLEAN CATCH   Final   Special Requests NONE   Final   Culture  Setup Time     Final   Value: 02/04/2014 21:11     Performed at [facility]     Final   Value: >=100,000 COLONIES/ML     Performed at [facility]   Culture     Final   Value: ESCHERICHIA COLI     Performed at [facility]   Report Status PENDING   Incomplete  CSF CULTURE     Status: None   Collection Time    02/04/14  8:24 PM      Result Value Ref Range Status   Specimen Description CSF   Final   Special Requests NONE   Final   Gram Stain     Final   Value: CYTOSPIN PREP WBC PRESENT, PREDOMINANTLY MONONUCLEAR     NO ORGANISMS SEEN     Gram Stain Report Called to,Read Back By and Verified With: Gram Stain Report Called to,Read Back By and Verified With: DUDLEY F/ED @2226  ON 02/04/14 BY KARCZEWSKI S. Performed by [facility]     Performed at [facility]   Culture PENDING   Incomplete   Report Status PENDING   Incomplete  GRAM STAIN     Status: None   Collection Time    02/04/14  8:24  PM      Result Value Ref Range Status   Specimen Description CSF   Final   Special Requests NONE   Final   Gram Stain     Final   Value: NO ORGANISMS SEEN     WBC PRESENT, PREDOMINANTLY MONONUCLEAR     CYTOSPIN PREP     Gram Stain Report Called to,Read Back By and Verified With: DUDLEY,F/ED @2226  ON 02/04/14 BY KARCZEWSKI,S.   Report Status 02/04/2014 FINAL   Final     Impression/Recommendation  Principal Problem:   Seizure Active Problems:   HIV INFECTION   History of JC encephalitis, 2012   Vincy Formanek is a 43 y.o. female with  HIV, prior possible PML with new seizure, stable CNS imaging and bland CSF .  #1 Seizures: I think these are more likely due to her having likely eliptogenic focus in  CNS from prior damage from Jc virus rather than representing new OI. She has been perfecctly compliant with [facility]  ---defer workup to Neurology re seizures and AED   (apparently she had similar episode in [place] earlier this year)  #2 Aseptic meningitis: I think the CSF pleiocytosis is DUE to the seizures and does not represent a new OI --if there is left over CSF I would consider adding a cryptococcal antigen for throughness as cryptomeningitis can present with underwhelming CSF profile, I am sure it will be negative  Her recent RPR was negative  Again I think this CSF profile is c/w her seizure   #3 HIV: highly compliant and reiterated today. No barriers to meds, never misses a dose per her account  I would NOT draw her HIV VL in the hospital as the phlebotomists at [facility] and lab here do NOT seem to properly process the blood for VL samples. IE the blood is NOT routinely RAPIDLY spun down and frozen to prevent viral replication in the test tube. WOuld  Prefer to do labs at [facility]. Similarly not much poit in rechecking CD4 in this case because her acute illness may artifically depress it  --continue Prezista, Norvir and Truvada in house and would strongly consider switch to [place] and  TRuvada in our clinic to simplify her to TWO pills. The COBI like NORVIR will inhibit tubular secration of creatinine and can raise measured serum CR without effecting true GFR. It tends to show less effect on those changing from Norvir/PI based regimen as effect is alreadyt there. Down the road could consider HIV genosure archive to deep sequence her virus and see if would be safe to change her to an INtegrase + NRTI regimen  #4 Uti: not clear on ssx to me: rocephin is rationale to me: take a look at antibiogram for E coli: likely to be sensitive to keflex   Would avoid FQ     02/05/2014, 6:25 PM   Thank you so much for this interesting consult  [place] [facility] [phone] (pager) [phone] (office) 02/05/2014, 6:25 PM  [name] 02/05/2014, 6:25 PM

## 2014-02-06 DIAGNOSIS — E119 Type 2 diabetes mellitus without complications: Secondary | ICD-10-CM

## 2014-02-06 DIAGNOSIS — E872 Acidosis, unspecified: Secondary | ICD-10-CM

## 2014-02-06 DIAGNOSIS — G40309 Generalized idiopathic epilepsy and epileptic syndromes, not intractable, without status epilepticus: Principal | ICD-10-CM

## 2014-02-06 DIAGNOSIS — R51 Headache: Secondary | ICD-10-CM

## 2014-02-06 LAB — URINE CULTURE

## 2014-02-06 LAB — BASIC METABOLIC PANEL
BUN: 8 mg/dL (ref 6–23)
CALCIUM: 9.9 mg/dL (ref 8.4–10.5)
CO2: 20 mEq/L (ref 19–32)
CREATININE: 1.03 mg/dL (ref 0.50–1.10)
Chloride: 105 mEq/L (ref 96–112)
GFR calc Af Amer: 77 mL/min — ABNORMAL LOW (ref >90)
GFR, EST NON AFRICAN AMERICAN: 67 mL/min — AB (ref >90)
Glucose, Bld: 89 mg/dL (ref 70–99)
Potassium: 3.7 mEq/L (ref 3.7–5.3)
Sodium: 141 mEq/L (ref 137–147)

## 2014-02-06 LAB — PATHOLOGIST SMEAR REVIEW

## 2014-02-06 MED ORDER — LEVETIRACETAM 500 MG PO TABS: 500.0000 mg | ORAL_TABLET | Freq: Two times a day (BID) | ORAL | Status: DC

## 2014-02-06 MED ORDER — CEPHALEXIN 500 MG PO CAPS: 500.0000 mg | ORAL_CAPSULE | Freq: Four times a day (QID) | ORAL | Status: AC

## 2014-02-06 NOTE — Progress Notes (Signed)
INTERNAL MEDICINE TEACHING SERVICE DAILY PROGRESS NOTE  Subjective: Patient reports no further seizure like acitivity, Currently denies dysuria but has occasional back pain when she urinates. EEG showed no epileptiform activity Objective: Vital signs in last 24 hours: Filed Vitals:   02/05/14 2112 02/06/14 0215 02/06/14 0653 02/06/14 0934  BP: 109/53 121/58 122/63 107/64  Pulse: 86 77 77 93  Temp: 99.5 F (37.5 C) 97.5 F (36.4 C) 98.5 F (36.9 C) 98.3 F (36.8 C)  TempSrc: Oral Oral Oral Oral  Resp: 20 18 20 18   Height:      Weight:      SpO2: 100% 100% 100% 100%   Weight change:   Intake/Output Summary (Last 24 hours) at 02/06/14 1152 Last data filed at 02/06/14 0900  Gross per 24 hour  Intake    720 ml  Output      0 ml  Net    720 ml   General: resting in bed HEENT: EOMI Cardiac: RRR, no murmurs Pulm: clear to auscultation bilaterally Abd: soft, nontender, nondistended, BS present, No CVA tenderness Ext: warm and well perfused, no pedal edema Neuro: alert and oriented X3, cranial nerves II-XII grossly intact  Lab Results: Basic Metabolic Panel:  Recent Labs Lab 02/05/14 1025 02/06/14 0735  NA 141 141  K 3.4* 3.7  CL 106 105  CO2 20 20  GLUCOSE 121* 89  BUN 8 8  CREATININE 1.03 1.03  CALCIUM 9.7 9.9  MG 1.9  --   PHOS 2.4  --    Liver Function Tests:  Recent Labs Lab 02/04/14 1640 02/05/14 1025  AST 31 21  ALT 29 22  ALKPHOS 99 76  BILITOT 0.4 0.3  PROT 9.6* 8.0  ALBUMIN 4.4 3.3*   CBC:  Recent Labs Lab 02/04/14 1640 02/05/14 1025  WBC 10.9* 5.4  NEUTROABS 2.2  --   HGB 14.1 13.0  HCT 40.6 36.6  MCV 96.0 94.3  PLT 258 195   Coagulation:  Recent Labs Lab 02/04/14 1640 02/05/14 1025  LABPROT 14.3 14.1  INR 1.13 1.11   Urine Drug Screen: Drugs of Abuse     Component Value Date/Time   LABOPIA NONE DETECTED 02/04/2014 1803   COCAINSCRNUR NONE DETECTED 02/04/2014 1803   LABBENZ NONE DETECTED 02/04/2014 1803   AMPHETMU NONE  DETECTED 02/04/2014 1803   THCU NONE DETECTED 02/04/2014 1803   LABBARB NONE DETECTED 02/04/2014 1803    Alcohol Level:  Recent Labs Lab 02/04/14 1640  ETH <11   Urinalysis:  Recent Labs Lab 02/04/14 1803  COLORURINE YELLOW  LABSPEC 1.020  PHURINE 6.0  GLUCOSEU NEGATIVE  HGBUR SMALL*  BILIRUBINUR NEGATIVE  KETONESUR NEGATIVE  PROTEINUR 30*  UROBILINOGEN 1.0  NITRITE POSITIVE*  LEUKOCYTESUR NEGATIVE    Micro Results: Recent Results (from the past 240 hour(s))  URINE CULTURE     Status: None   Collection Time    02/04/14  6:03 PM      Result Value Ref Range Status   Specimen Description URINE, CLEAN CATCH   Final   Special Requests NONE   Final   Culture  Setup Time     Final   Value: 02/04/2014 21:11     Performed at [facility] Count     Final   Value: >=100,000 COLONIES/ML     Performed at [facility]   Culture     Final   Value: ESCHERICHIA COLI     Performed at [facility]  Report Status PENDING   Incomplete  CSF CULTURE     Status: None   Collection Time    02/04/14  8:24 PM      Result Value Ref Range Status   Specimen Description CSF   Final   Special Requests NONE   Final   Gram Stain     Final   Value: CYTOSPIN PREP WBC PRESENT, PREDOMINANTLY MONONUCLEAR     NO ORGANISMS SEEN     Gram Stain Report Called to,Read Back By and Verified With: Gram Stain Report Called to,Read Back By and Verified With: DUDLEY F/ED @2226  ON 02/04/14 BY KARCZEWSKI S. Performed by [facility]     Performed at [facility]   Culture     Final   Value: NO GROWTH 1 DAY     Performed at [facility]   Report Status PENDING   Incomplete  GRAM STAIN     Status: None   Collection Time    02/04/14  8:24 PM      Result Value Ref Range Status   Specimen Description CSF   Final   Special Requests NONE   Final   Gram Stain     Final   Value: NO ORGANISMS SEEN     WBC PRESENT, PREDOMINANTLY MONONUCLEAR     CYTOSPIN PREP      Gram Stain Report Called to,Read Back By and Verified With: DUDLEY,F/ED @2226  ON 02/04/14 BY KARCZEWSKI,S.   Report Status 02/04/2014 FINAL   Final   Studies/Results: Ct Head Wo Contrast  02/04/2014   CLINICAL DATA:  New onset of seizures. Altered mental status. History of HIV.  EXAM: CT HEAD WITHOUT CONTRAST  TECHNIQUE: Contiguous axial images were obtained from the base of the skull through the vertex without intravenous contrast.  COMPARISON:  CT scan dated 08/03/2011 and MRI dated 08/03/2011  FINDINGS: There is no acute intracranial hemorrhage, mass lesion, or infarction. There is diffuse cerebral cortical atrophy and mild cerebellar atrophy. There is extensive irregular periventricular white matter lucency with focal areas of more prominent white matter lucency high in the right parietal region and in the left frontal lobe which correlate with abnormalities seen on the prior MRI which were thought to be atypical infection.  There is no osseous abnormality.  IMPRESSION: No acute intracranial abnormality. No change since the brain MRI dated 08/03/2011. Chronic white matter disease. The MRI of 08/03/2011 demonstrated probable atypical infection in the brain.   Electronically Signed   By: Geanie Cooley M.D.   On: 02/04/2014 17:48   Mr [name] OZ Contrast  02/05/2014   CLINICAL DATA:  43 year old female with confusion, agitation, seizure. Initial encounter. HIV.  EXAM: MRI HEAD WITHOUT AND WITH CONTRAST  TECHNIQUE: Multiplanar, multiecho pulse sequences of the brain and surrounding structures were obtained without and with intravenous contrast.  CONTRAST:  15mL MULTIHANCE GADOBENATE DIMEGLUMINE 529 MG/ML IV SOLN  COMPARISON:  Brain MRI 08/03/2011. Head CT without contrast 02/04/2014.  FINDINGS: Study is repeat intermittently degraded by motion artifact despite repeated imaging attempts.  Cerebral volume is mildly decreased since Mar 31, 2011. Major intracranial vascular flow voids are stable. No restricted  diffusion or evidence of acute infarction.  Progressed but more generalized cerebral white matter T2 and FLAIR hyperintensity. No associated mass effect, as was noted in some regions on the 03-31-2011 study. In some of those areas which were more severe in 2011/03/31, more pronounced white matter encephalomalacia has developed (e.g. Posterior right temporal lobe series 6, image  10 today versus series 5, image 10 previously, and right peri-Rolandic white matter image 16 today and image 16 previously).  Post-contrast images do not demonstrate abnormal parenchymal enhancement, but there is increased smooth dural thickening and enhancement (series 14, image 27). However, this patient has undergone recent lumbar puncture (yesterday, 02/04/2014). No ventriculomegaly. No intraventricular debris identified. No subependymal enhancement.  No gyral edema. No intracranial mass effect. Patent basilar cisterns. No acute intracranial hemorrhage identified. Deep gray matter nuclei, brainstem and cerebellum are within normal limits. Visible internal auditory structures appear normal. Visualized paranasal sinuses and mastoids are clear. Visualized orbit soft tissues are within normal limits. Negative pituitary and cervicomedullary junction. Grossly negative visualized cervical spine.  Bone marrow signal is stable.  Negative scalp soft tissues.  IMPRESSION: 1. Increased mild diffuse dural thickening and enhancement, but may simply be a sequelae of the lumbar puncture on 02/04/2014. Correlate with CSF findings regarding the possibility of acute meningitis. 2. More generalized cerebral white matter abnormality since 2012, with some of the severe areas of involvement on that piror MRI now demonstrating some encephalomalacia/gliosis. Superimposed generalized cerebral volume loss. The appearance now may reflect sequelae of the 2012 encephalitis plus chronic HIV encephalitis. PML not suspected at this time. 3. No acute infarct.   Electronically Signed    By: Augusto GambleLee  Hall M.D.   On: 02/05/2014 21:21   Dg Chest Port 1 View  02/04/2014   CLINICAL DATA:  Altered mental status.  EXAM: PORTABLE CHEST - 1 VIEW  COMPARISON:  12/29/2013  FINDINGS: The patient unable to raise head as head and chin overlying the medial lung apices/upper mediastinum. Lungs are adequately inflated without focal consolidation or effusion. The cardiomediastinal silhouette and remainder of the exam is unchanged.  IMPRESSION: No active disease.   Electronically Signed   By: Elberta Fortisaniel  Boyle M.D.   On: 02/04/2014 17:15   Dg Fluoro Guide Lumbar Puncture  02/05/2014   CLINICAL DATA:  Seizure.  HIV.  EXAM: DIAGNOSTIC LUMBAR PUNCTURE UNDER FLUOROSCOPIC GUIDANCE  FLUOROSCOPY TIME:  0 min 21 seconds  PROCEDURE: Informed consent was obtained from the patient prior to the procedure, including potential complications of headache, allergy, and pain. With the patient prone, the lower back was prepped with Betadine. 1% Lidocaine was used for local anesthesia. Lumbar puncture was performed at the L3-4 level using a 21 gauge needle with return of clear colorless CSF with an opening pressure of 14 cm water. Nineteenml of CSF were obtained for laboratory studies. The patient tolerated the procedure well and there were no apparent complications.  IMPRESSION: Lumbar puncture performed without complication.   Electronically Signed   By: Geanie CooleyJim  Maxwell M.D.   On: 02/05/2014 08:04   Medications: I have reviewed the patient's current medications. Scheduled Meds: . cefTRIAXone (ROCEPHIN)  IV  1 g Intravenous Q24H  . Darunavir Ethanolate  800 mg Oral Q breakfast  . emtricitabine-tenofovir  1 tablet Oral Daily  . enoxaparin (LOVENOX) injection  40 mg Subcutaneous Q24H  . levETIRAcetam  500 mg Oral BID  . ritonavir  100 mg Oral Q breakfast   Continuous Infusions:  PRN Meds:. Assessment/Plan:    [place] Seizure EEG shows no elipitform activity, MRI shows chronic changes ?HIV encephalitis -Continue Keppra 500mg   BID - Follow CSF cultures, labs  (HSV, CMV, VZV pending) - ID consulted, HIV well controlled and do not suspect OI.   - Patient will need follow up with ID after discharge    HIV INFECTION Last CD4 count to  12/04/2013 was 220 and the viral load of <20. -Continue home medication Prezista, Truvada, Ritonavir - Appreciate Dr. Daiva Eves input, will need follow up with [facility] for possible medication changes.   UTI - Continue Rocephin today, UCX showed E Coli, sensitivites pending. Will likely discharge on Keflex.  Hypokalemia- resolved.  Increased Anion gap metabolic acidosis- resolving -Anion gap at 16, bicarb improved to 20, delta delta of 1. - No sign of renal failure, no history of ingestions, likely resolving lactic acidosis from seizure.  Will recheck BMP as outpatient.  ?Diabetes vs Impaired Fasting Glucose -Hgb A1c 7.5 in feb 2015, unclear if this was confirmed with fasting glucose. (Patient missed appointment to follow up with new PCP [facility])) Blood sugars well controlled with this hospitalization will need follow up as outpatient.  Dispo: Will discharge home today, she will need follow up with PCP, ID, and neurology.  The patient does have a current PCP (Kennis Carina, MD) and does need an [facility] hospital follow-up appointment after discharge.  The patient does not have transportation limitations that hinder transportation to clinic appointments.  .Services Needed at time of discharge: Y = Yes, Blank = No PT:   OT:   RN:   Equipment:   Other:     LOS: 2 days   Anita Purl, DO 02/06/2014, 11:52 AM

## 2014-02-06 NOTE — Discharge Summary (Signed)
Name: [name] MRN: [id number] DOB: 02/28/1971 43 y.o. PCP: Orland PenmanIbtehal Jaralla Shamleffer, MD  Date of Admission: 02/04/2014  4:41 PM Date of Discharge: 02/06/2014 Attending Physician: Burns SpainElizabeth A Butcher, MD  Discharge Diagnosis: Principal Problem:   Seizure Active Problems:   HIV INFECTION   History of JC encephalitis, 2012 Diabetes Mellitus Urinary Tract Infection Hypokalemia Increased Anion gap metabolic acidosis  Discharge Medications:   Medication List    STOP taking these medications       glimepiride 1 MG tablet  Commonly known as:  AMARYL     PRESCRIPTION MEDICATION      TAKE these medications       cephALEXin 500 MG capsule  Commonly known as:  KEFLEX  Take 1 capsule (500 mg total) by mouth 4 (four) times daily.  Start taking on:  02/07/2014     Darunavir Ethanolate 800 MG tablet  Commonly known as:  PREZISTA  Take 1 tablet (800 mg total) by mouth daily.     emtricitabine-tenofovir 200-300 MG per tablet  Commonly known as:  TRUVADA  Take 1 tablet by mouth daily.     levETIRAcetam 500 MG tablet  Commonly known as:  KEPPRA  Take 1 tablet (500 mg total) by mouth 2 (two) times daily.     ritonavir 100 MG capsule  Commonly known as:  NORVIR  Take 100 mg by mouth daily with breakfast.        Disposition and follow-up:   [name] St[facility].  At the hospital follow up visit please address:  1.  Any further seizure like activity? Medication reconciliation (patient started on Keppra 500mg  BID), was unclear about diabetes medications and blood sugars were well controlled during hospitalization (instructed to hold until hospital follow up), resolution of UTI symptoms (Due to E Coli, discharged with 5 day course of Keflex to complete 7 days Tx)  2.  Labs / imaging needed at time of follow-up: BMP (ensure resolution of increased Anion gap, recheck K+)  3.  Pending labs/ test needing follow-up: CSF  culture (NGTD), CSF labs: VZV, CMV, HSV  Follow-up Appointments: Follow-up Information   Follow up with Elspeth ChoSUMNER, PETER, JUSTIN, DO On 02/22/2014. (at 1pm, bring medications and insurance card)    Specialty:  Neurology   Contact information:   [address] [place] [address] [phone]       Follow up with Staci RighterOMER, ROBERT, MD On 04/09/2014. (at 2:30pm)    Specialty:  Infectious Diseases   Contact information:   [address] [place] [address] [phone]       Follow up with Raelyn MoraJARALLA SHAMLEFFER, IBTEHAL, MD On 02/20/2014. (at 11:15am)    Specialty:  Internal Medicine   Contact information:   [address]  [address] [place] [address] [phone]       Discharge Instructions: Discharge Orders   Future Appointments Provider Department Dept Phone   02/10/2014 8:30 AM [facility] D[facility] Class 1 [facility] [phone]   02/22/2014 1:00 PM Omelia BlackwaterPeter Justin Sumner, DO [facility] [phone]   04/09/2014 2:30 PM Gardiner Barefootobert W Comer, MD [facility] [phone]   Future Orders Complete By Expires   Call MD for:  persistant nausea and vomiting  As directed    Call MD for:  temperature >100.4  As directed    Diet - low sodium heart healthy  As directed  Discharge instructions  As directed    Comments:     Please take your new medication Keppra 500mg  twice a day.  Please also take Keflex 1 tablet twice a day for 5 days for your UTI start this tomorrow.  You will need to follow up with your PCP (Dr. Gust Brooms), Neurology, and Infectious disease (Dr. Luciana Axe)   Increase activity slowly  As directed       Consultations:    Procedures Performed:  Ct Head Wo Contrast  02/04/2014   CLINICAL DATA:  New onset of seizures. Altered mental status. History of HIV.  EXAM: CT HEAD WITHOUT CONTRAST  TECHNIQUE: Contiguous axial images were obtained from the base of the skull  through the vertex without intravenous contrast.  COMPARISON:  CT scan dated 08/03/2011 and MRI dated 08/03/2011  FINDINGS: There is no acute intracranial hemorrhage, mass lesion, or infarction. There is diffuse cerebral cortical atrophy and mild cerebellar atrophy. There is extensive irregular periventricular white matter lucency with focal areas of more prominent white matter lucency high in the right parietal region and in the left frontal lobe which correlate with abnormalities seen on the prior MRI which were thought to be atypical infection.  There is no osseous abnormality.  IMPRESSION: No acute intracranial abnormality. No change since the brain MRI dated 08/03/2011. Chronic white matter disease. The MRI of 08/03/2011 demonstrated probable atypical infection in the brain.   Electronically Signed   By: Geanie Cooley M.D.   On: 02/04/2014 17:48   Mr [name] ZO Contrast  02/05/2014   CLINICAL DATA:  43 year old female with confusion, agitation, seizure. Initial encounter. HIV.  EXAM: MRI HEAD WITHOUT AND WITH CONTRAST  TECHNIQUE: Multiplanar, multiecho pulse sequences of the brain and surrounding structures were obtained without and with intravenous contrast.  CONTRAST:  15mL MULTIHANCE GADOBENATE DIMEGLUMINE 529 MG/ML IV SOLN  COMPARISON:  Brain MRI 08/03/2011. Head CT without contrast 02/04/2014.  FINDINGS: Study is repeat intermittently degraded by motion artifact despite repeated imaging attempts.  Cerebral volume is mildly decreased since 03/23/2011. Major intracranial vascular flow voids are stable. No restricted diffusion or evidence of acute infarction.  Progressed but more generalized cerebral white matter T2 and FLAIR hyperintensity. No associated mass effect, as was noted in some regions on the 2011-03-23 study. In some of those areas which were more severe in Mar 23, 2011, more pronounced white matter encephalomalacia has developed (e.g. Posterior right temporal lobe series 6, image 10 today versus series 5, image 10  previously, and right peri-Rolandic white matter image 16 today and image 16 previously).  Post-contrast images do not demonstrate abnormal parenchymal enhancement, but there is increased smooth dural thickening and enhancement (series 14, image 27). However, this patient has undergone recent lumbar puncture (yesterday, 02/04/2014). No ventriculomegaly. No intraventricular debris identified. No subependymal enhancement.  No gyral edema. No intracranial mass effect. Patent basilar cisterns. No acute intracranial hemorrhage identified. Deep gray matter nuclei, brainstem and cerebellum are within normal limits. Visible internal auditory structures appear normal. Visualized paranasal sinuses and mastoids are clear. Visualized orbit soft tissues are within normal limits. Negative pituitary and cervicomedullary junction. Grossly negative visualized cervical spine.  Bone marrow signal is stable.  Negative scalp soft tissues.  IMPRESSION: 1. Increased mild diffuse dural thickening and enhancement, but may simply be a sequelae of the lumbar puncture on 02/04/2014. Correlate with CSF findings regarding the possibility of acute meningitis. 2. More generalized cerebral white matter abnormality since 23-Mar-2011, with some of the severe areas of involvement on that  piror MRI now demonstrating some encephalomalacia/gliosis. Superimposed generalized cerebral volume loss. The appearance now may reflect sequelae of the 2012 encephalitis plus chronic HIV encephalitis. PML not suspected at this time. 3. No acute infarct.   Electronically Signed   By: Augusto Gamble M.D.   On: 02/05/2014 21:21   Dg Chest Port 1 View  02/04/2014   CLINICAL DATA:  Altered mental status.  EXAM: PORTABLE CHEST - 1 VIEW  COMPARISON:  12/29/2013  FINDINGS: The patient unable to raise head as head and chin overlying the medial lung apices/upper mediastinum. Lungs are adequately inflated without focal consolidation or effusion. The cardiomediastinal silhouette and  remainder of the exam is unchanged.  IMPRESSION: No active disease.   Electronically Signed   By: Elberta Fortis M.D.   On: 02/04/2014 17:15   Dg Fluoro Guide Lumbar Puncture  02/05/2014   CLINICAL DATA:  Seizure.  HIV.  EXAM: DIAGNOSTIC LUMBAR PUNCTURE UNDER FLUOROSCOPIC GUIDANCE  FLUOROSCOPY TIME:  0 min 21 seconds  PROCEDURE: Informed consent was obtained from the patient prior to the procedure, including potential complications of headache, allergy, and pain. With the patient prone, the lower back was prepped with Betadine. 1% Lidocaine was used for local anesthesia. Lumbar puncture was performed at the L3-4 level using a 21 gauge needle with return of clear colorless CSF with an opening pressure of 14 cm water. Nineteenml of CSF were obtained for laboratory studies. The patient tolerated the procedure well and there were no apparent complications.  IMPRESSION: Lumbar puncture performed without complication.   Electronically Signed   By: Geanie Cooley M.D.   On: 02/05/2014 08:04   Admission HPI: 43 Y O F with PMH of HIV, Depresion, presented to [facility] with c/o of new onset seizures, pt was then transferred to [facility]. Pt was sleeping when we attempted to get Hx at 2.30am, was difficult to arouse and kept falling asleep. No family was present. As per documentation in ED physicians notes- Hx gotten from husband, Pt had a Grand mal seizure, lasting 2-3 mins, prior to this Pt was complaining of generalised weakness and cold, pt later was more conscious and endorsed waking up with a headache that morning.  Hospital Course by problem list: Kyla Balzarine Seizure  Patient presented after grand mal seizure.  Neurology was consulted in the ED and loaded with Keppra.  CT scan showed chronic changes associated with her Hx of JV virus infection and immune reconstitution.  A lumbar puncture was obtained which showed WBC 8, Total Protein of 62, and glucose of 61, gram stain was negative for organism.  CSF  fluid was sent for VZV, HSV, and CMV.  Cryptococcal ag was not checked as her last CD4 count was 220.  An EEG was obtained which showed no elipitform activity, MRI shows chronic changes associated with the previous JV virus infection and a possible superimposed HIV encephalitis.  During her hospitalization she experienced no further seizure like activity, she did note that she had a previous Seizure 1 year prior in [place] and received treatment in the hospital but was not given any medication after discharge.  Per neurology she was discharged on Keppra 500mg  BID and scheduled a follow up appointment with neurology in ~2weeks  HIV INFECTION  Last CD4 count to 12/04/2013 was 220 and the viral load of <20. Her home medications Prezista, Truvada, Ritonavir were continued. Dr. Daiva Eves, ID was consulted and did not think CSF findings suggestive OI or atypical meningitis.  She is to follow up with ID after discharge.  UTI  - Patient reported UTI symptoms urine culture showed E Coli, sensetive to Cefelexin. She was treated with 2 days of Ceftriaxone inpatient and discharged on a 5 day course of Keflex   Increased Anion gap metabolic acidosis -Patient initially presented with an anion gap of 27 and bicarb of 12, this was attributed to lactic acid from seizure activity. She had no sign of renal failure, no history of ingestions, she was treated with IVF and supportive care. On discharge anion gap improved to 16, bicarb improved to 20, delta delta of 1. Will need recheck BMP as outpatient.   Diabetes Mellitus -Hgb A1c 7.5 in feb 2015, patient reports taking Glimepiride and one other medication at home. These were held inpatinet, her blood sugars remained at goal during hospitalization.  As it was unclear what she was taking her level of diabetes control she was instructed to hold these medications at discharge until she followed up with her PCP.   Discharge Vitals:   BP 113/76  Pulse 87  Temp(Src) 98.7 F  (37.1 C) (Oral)  Resp 18  Ht 5\' 3"  (1.6 m)  Wt 153 lb 12.8 oz (69.763 kg)  BMI 27.25 kg/m2  SpO2 100%  Discharge Labs:  Results for orders placed during the hospital encounter of 02/04/14 (from the past 24 hour(s))  BASIC METABOLIC PANEL     Status: Abnormal   Collection Time    02/06/14  7:35 AM      Result Value Ref Range   Sodium 141  137 - 147 mEq/L   Potassium 3.7  3.7 - 5.3 mEq/L   Chloride 105  96 - 112 mEq/L   CO2 20  19 - 32 mEq/L   Glucose, Bld 89  70 - 99 mg/dL   BUN 8  6 - 23 mg/dL   Creatinine, Ser 1.61  0.50 - 1.10 mg/dL   Calcium 9.9  8.4 - 09.6 mg/dL   GFR calc non Af Amer 67 (*) >90 mL/min   GFR calc Af Amer 77 (*) >90 mL/min    Signed: Carlynn Purl, DO 02/06/2014, 4:41 PM   Time Spent on Discharge: 35 minutes Services Ordered on Discharge: None Equipment Ordered on Discharge: None

## 2014-02-06 NOTE — Progress Notes (Signed)
Discharge instructions given. Pt verbalized understanding and all questions were answered.  

## 2014-02-06 NOTE — Progress Notes (Signed)
[facility]    Subjective: No new complaints   Antibiotics:  Anti-infectives   Start     Dose/Rate Route Frequency Ordered Stop   02/05/14 1000  emtricitabine-tenofovir (TRUVADA) 200-300 MG per tablet 1 tablet     1 tablet Oral Daily 02/05/14 0239     02/05/14 0800  ritonavir (NORVIR) tablet 100 mg     100 mg Oral Daily with breakfast 02/05/14 0239     02/05/14 0800  Darunavir Ethanolate (PREZISTA) tablet 800 mg     800 mg Oral Daily with breakfast 02/05/14 0239     02/05/14 0700  cefTRIAXone (ROCEPHIN) 1 g in dextrose 5 % 50 mL IVPB     1 g 100 mL/hr over 30 Minutes Intravenous Every 24 hours 02/05/14 0652     02/05/14 0000  cefTRIAXone (ROCEPHIN) 1 g in dextrose 5 % 50 mL IVPB  Status:  Discontinued     1 g 100 mL/hr over 30 Minutes Intravenous  Once 02/05/14 0000 02/05/14 0239      Medications: Scheduled Meds: . cefTRIAXone (ROCEPHIN)  IV  1 g Intravenous Q24H  . Darunavir Ethanolate  800 mg Oral Q breakfast  . emtricitabine-tenofovir  1 tablet Oral Daily  . enoxaparin (LOVENOX) injection  40 mg Subcutaneous Q24H  . levETIRAcetam  500 mg Oral BID  . ritonavir  100 mg Oral Q breakfast   Continuous Infusions:  PRN Meds:.    Objective: Weight change:   Intake/Output Summary (Last 24 hours) at 02/06/14 1441 Last data filed at 02/06/14 1300  Gross per 24 hour  Intake    720 ml  Output      0 ml  Net    720 ml   Blood pressure 113/76, pulse 87, temperature 98.7 F (37.1 C), temperature source Oral, resp. rate 18, height 5\' 3"  (1.6 m), weight 153 lb 12.8 oz (69.763 kg), SpO2 100.00%. Temp:  [97.5 F (36.4 C)-99.5 F (37.5 C)] 98.7 F (37.1 C) (04/07 1348) Pulse Rate:  [77-93] 87 (04/07 1348) Resp:  [18-20] 18 (04/07 1348) BP: (101-122)/(53-79) 113/76 mmHg (04/07 1348) SpO2:  [100 %] 100 % (04/07 1348)  Physical Exam: General: Alert and awake, oriented x3, not in any acute distress. HEENT: anicteric sclera, pupils reactive to  light and accommodation, EOMI CVS regular rate, normal r,  no murmur rubs or gallops Chest: clear to auscultation bilaterally, no wheezing, rales or rhonchi Abdomen: soft nontender, nondistended, normal bowel sounds, Extremities: no  clubbing or edema noted bilaterally Skin: no rashes Lymph: no new lymphadenopathy Neuro: nonfocal  CBC:   Recent Labs  02/04/14 1640  02/05/14 1025 02/06/14 0735  WBC 10.9*  --  5.4  --   HGB 14.1  --  13.0  --   HCT 40.6  --  36.6  --   NA 140  < > 141 141  K 3.6*  < > 3.4* 3.7  CL 101  < > 106 105  CO2 12*  < > 20 20  BUN 13  < > 8 8  CREATININE 1.04  < > 1.03 1.03  < > = values in this interval not displayed.   BMET  Recent Labs  02/05/14 1025 02/06/14 0735  NA 141 141  K 3.4* 3.7  CL 106 105  CO2 20 20  GLUCOSE 121* 89  BUN 8 8  CREATININE 1.03 1.03  CALCIUM 9.7 9.9     Liver Panel   Recent Labs  02/04/14 1640  02/05/14 1025  PROT 9.6* 8.0  ALBUMIN 4.4 3.3*  AST 31 21  ALT 29 22  ALKPHOS 99 76  BILITOT 0.4 0.3       Sedimentation Rate No results found for this basename: ESRSEDRATE,  in the last 72 hours C-Reactive Protein No results found for this basename: CRP,  in the last 72 hours  Micro Results: Recent Results (from the past 240 hour(s))  URINE CULTURE     Status: None   Collection Time    02/04/14  6:03 PM      Result Value Ref Range Status   Specimen Description URINE, CLEAN CATCH   Final   Special Requests NONE   Final   Culture  Setup Time     Final   Value: 02/04/2014 21:11     [facility]formed [facility]   Colony Count     Final   Value: >=100,000 COLONIES/ML     [facility]formed at [facility]   Culture     Final   Value: ESCHERICHIA COLI     Performed at [facility]   Report Status 02/06/2014 FINAL   Final   Organism ID, Bacteria ESCHERICHIA COLI   Final  CSF CULTURE     Status: None   Collection Time    02/04/14  8:24 PM      Result Value Ref Range Status    Specimen Description CSF   Final   Special Requests NONE   Final   Gram Stain     Final   Value: CYTOSPIN PREP WBC PRESENT, PREDOMINANTLY MONONUCLEAR     NO ORGANISMS SEEN     Gram Stain Report Called to,Read Back By and Verified With: Gram Stain Report Called to,Read Back By and Verified With: DUDLEY F/ED @2226  ON 02/04/14 BY KARCZEWSKI S. [facility]     [facility] at [facility]   Culture     Final   Value: NO GROWTH 1 DAY     [facility]formed at [facility]   Report Status PENDING   Incomplete  GRAM STAIN     Status: None   Collection Time    02/04/14  8:24 PM      Result Value Ref Range Status   Specimen Description CSF   Final   Special Requests NONE   Final   Gram Stain     Final   Value: NO ORGANISMS SEEN     WBC PRESENT, PREDOMINANTLY MONONUCLEAR     CYTOSPIN PREP     Gram Stain Report Called to,Read Back By and Verified With: DUDLEY,F/ED @2226  ON 02/04/14 BY KARCZEWSKI,S.   Report Status 02/04/2014 FINAL   Final    Studies/Results: Ct Head Wo Contrast  02/04/2014   CLINICAL DATA:  New onset of seizures. Altered mental status. History of HIV.  EXAM: CT HEAD WITHOUT CONTRAST  TECHNIQUE: Contiguous axial images were obtained from the base of the skull through the vertex without intravenous contrast.  COMPARISON:  CT scan dated 08/03/2011 and MRI dated 08/03/2011  FINDINGS: There is no acute intracranial hemorrhage, mass lesion, or infarction. There is diffuse cerebral cortical atrophy and mild cerebellar atrophy. There is extensive irregular periventricular white matter lucency with focal areas of more prominent white matter lucency high in the right parietal region and in the left frontal lobe which correlate with abnormalities seen on the prior MRI which were thought to be atypical infection.  There is no osseous abnormality.  IMPRESSION: No  acute intracranial abnormality. No change since the brain MRI dated 08/03/2011. Chronic white matter disease.  The MRI of 08/03/2011 demonstrated probable atypical infection in the brain.   Electronically Signed   By: Geanie Cooley M.D.   On: 02/04/2014 17:48   Mr [name] WJ Contrast  02/05/2014   CLINICAL DATA:  43 year old female with confusion, agitation, seizure. Initial encounter. HIV.  EXAM: MRI HEAD WITHOUT AND WITH CONTRAST  TECHNIQUE: Multiplanar, multiecho pulse sequences of the brain and surrounding structures were obtained without and with intravenous contrast.  CONTRAST:  15mL MULTIHANCE GADOBENATE DIMEGLUMINE 529 MG/ML IV SOLN  COMPARISON:  Brain MRI 08/03/2011. Head CT without contrast 02/04/2014.  FINDINGS: Study is repeat intermittently degraded by motion artifact despite repeated imaging attempts.  Cerebral volume is mildly decreased since 03/26/11. Major intracranial vascular flow voids are stable. No restricted diffusion or evidence of acute infarction.  Progressed but more generalized cerebral white matter T2 and FLAIR hyperintensity. No associated mass effect, as was noted in some regions on the 03/26/2011 study. In some of those areas which were more severe in Mar 26, 2011, more pronounced white matter encephalomalacia has developed (e.g. Posterior right temporal lobe series 6, image 10 today versus series 5, image 10 previously, and right peri-Rolandic white matter image 16 today and image 16 previously).  Post-contrast images do not demonstrate abnormal parenchymal enhancement, but there is increased smooth dural thickening and enhancement (series 14, image 27). However, this patient has undergone recent lumbar puncture (yesterday, 02/04/2014). No ventriculomegaly. No intraventricular debris identified. No subependymal enhancement.  No gyral edema. No intracranial mass effect. Patent basilar cisterns. No acute intracranial hemorrhage identified. Deep gray matter nuclei, brainstem and cerebellum are within normal limits. Visible internal auditory structures appear normal. Visualized paranasal sinuses and mastoids are  clear. Visualized orbit soft tissues are within normal limits. Negative pituitary and cervicomedullary junction. Grossly negative visualized cervical spine.  Bone marrow signal is stable.  Negative scalp soft tissues.  IMPRESSION: 1. Increased mild diffuse dural thickening and enhancement, but may simply be a sequelae of the lumbar puncture on 02/04/2014. Correlate with CSF findings regarding the possibility of acute meningitis. 2. More generalized cerebral white matter abnormality since 03/26/2011, with some of the severe areas of involvement on that piror MRI now demonstrating some encephalomalacia/gliosis. Superimposed generalized cerebral volume loss. The appearance now may reflect sequelae of the 03/26/11 encephalitis plus chronic HIV encephalitis. PML not suspected at this time. 3. No acute infarct.   Electronically Signed   By: Augusto Gamble M.D.   On: 02/05/2014 21:21   Dg Chest Port 1 View  02/04/2014   CLINICAL DATA:  Altered mental status.  EXAM: PORTABLE CHEST - 1 VIEW  COMPARISON:  12/29/2013  FINDINGS: The patient unable to raise head as head and chin overlying the medial lung apices/upper mediastinum. Lungs are adequately inflated without focal consolidation or effusion. The cardiomediastinal silhouette and remainder of the exam is unchanged.  IMPRESSION: No active disease.   Electronically Signed   By: Elberta Fortis M.D.   On: 02/04/2014 17:15   Dg Fluoro Guide Lumbar Puncture  02/05/2014   CLINICAL DATA:  Seizure.  HIV.  EXAM: DIAGNOSTIC LUMBAR PUNCTURE UNDER FLUOROSCOPIC GUIDANCE  FLUOROSCOPY TIME:  0 min 21 seconds  PROCEDURE: Informed consent was obtained from the patient prior to the procedure, including potential complications of headache, allergy, and pain. With the patient prone, the lower back was prepped with Betadine. 1% Lidocaine was used for local anesthesia. Lumbar puncture was performed at  the L3-4 level using a 21 gauge needle with return of clear colorless CSF with an opening pressure of 14 cm  water. Nineteenml of CSF were obtained for laboratory studies. The patient tolerated the procedure well and there were no apparent complications.  IMPRESSION: Lumbar puncture performed without complication.   Electronically Signed   By: Geanie Cooley M.D.   On: 02/05/2014 08:04      Assessment/Plan:  Principal Problem:   Seizure Active Problems:   HIV INFECTION   History of JC encephalitis, 2012    Symiah Bushong is a 43 y.o. female withwith HIV, prior possible PML with new seizure, stable CNS imaging and bland CSF .   #1 Seizures: I think these are more likely due to her having likely eliptogenic focus in CNS from prior damage from Jc virus rather than representing new OI. She has been perfecctly compliant with [facility]  ---defer workup to Neurology re seizures and AED  (apparently she had similar episode in [place] earlier this year)   #2 Aseptic meningitis: I think the CSF pleiocytosis is DUE to the seizures and does not represent a new OI  --if there is left over CSF I would consider adding a cryptococcal antigen for throughness as cryptomeningitis can present with underwhelming CSF profile, I am sure it will be negative  Her recent RPR was negative  Again I think this CSF profile is c/w her seizure   #3 HIV: highly compliant and reiterated today. No barriers to meds, never misses a dose per her account   I would NOT draw her HIV VL in the hospital as the phlebotomists at [facility] and lab here do NOT seem to properly process the blood for VL samples. IE the blood is NOT routinely RAPIDLY spun down and frozen to prevent viral replication in the test tube. WOuld Prefer to do labs at [facility]. Similarly not much poit in rechecking CD4 in this case because her acute illness may artifically depress it   --continue Prezista, Norvir and Truvada in house and would strongly consider switch to PREZCOBIX and TRuvada in our clinic to simplify her to TWO pills. The COBI like NORVIR will inhibit tubular secration  of creatinine and can raise measured serum CR without effecting true GFR. It tends to show less effect on those changing from Norvir/PI based regimen as effect is alreadyt there. Down the road could consider HIV genosure archive to deep sequence her virus and see if would be safe to change her to an INtegrase + NRTI regimen   #4 Uti: sounds like she DID have some ssx  --would finish 5 day TOTAl course with keflex  She can followup in our clinic in a 3-4 weeks with Dr. Luciana Axe     LOS: 2 days   Paulette Blanch Dam 02/06/2014, 2:41 PM

## 2014-02-06 NOTE — Progress Notes (Signed)
NEURO HOSPITALIST PROGRESS NOTE   SUBJECTIVE:                                                                                                                        No further seizure activity over night.  Feeling well. EEG showed no epileptiform activity.   OBJECTIVE:                                                                                                                           Vital signs in last 24 hours: Temp:  [97.5 F (36.4 C)-99.5 F (37.5 C)] 98.3 F (36.8 C) (04/07 0934) Pulse Rate:  [77-93] 93 (04/07 0934) Resp:  [18-20] 18 (04/07 0934) BP: (101-122)/(53-79) 107/64 mmHg (04/07 0934) SpO2:  [100 %] 100 % (04/07 0934)  Intake/Output from previous day: 04/06 0701 - 04/07 0700 In: 530 [P.O.:480; IV Piggyback:50] Out: -  Intake/Output this shift: Total I/O In: 360 [P.O.:360] Out: -  Nutritional status: Cardiac  Past Medical History  Diagnosis Date  . Abnormal Pap smear   . Depression   . HIV disease   . Back pain      Neurologic Exam:  Mental Status: Alert, oriented, thought content appropriate.  Speech fluent without evidence of aphasia.  Able to follow 3 step commands without difficulty. Cranial Nerves: II: Visual fields grossly normal, pupils equal, round, reactive to light and accommodation III,IV, VI: ptosis not present, extra-ocular motions intact bilaterally V,VII: smile symmetric, facial light touch sensation normal bilaterally VIII: hearing normal bilaterally IX,X: gag reflex present XI: bilateral shoulder shrug XII: midline tongue extension without atrophy or fasciculations  Motor: Right : Upper extremity   5/5    Left:     Upper extremity   5/5  Lower extremity   5/5     Lower extremity   5/5 Tone and bulk:normal tone throughout; no atrophy noted Sensory: Pinprick and light touch intact throughout, bilaterally Deep Tendon Reflexes:  Right: Upper Extremity   Left: Upper extremity   biceps (C-5 to  C-6) 2/4   biceps (C-5 to C-6) 2/4 tricep (C7) 2/4    triceps (C7) 2/4 Brachioradialis (C6) 2/4  Brachioradialis (C6) 2/4  Lower Extremity Lower Extremity  quadriceps (L-2 to L-4) 2/4  quadriceps (L-2 to L-4) 2/4 Achilles (S1) 2/4   Achilles (S1) 2/4  Plantars: Right: downgoing   Left: downgoing    Lab Results: Basic Metabolic Panel:  Recent Labs Lab 02/04/14 1640 02/04/14 2118 02/05/14 1025 02/06/14 0735  NA 140 136* 141 141  K 3.6* 4.8 3.4* 3.7  CL 101 106 106 105  CO2 12* 17* 20 20  GLUCOSE 144* 93 121* 89  BUN 13 11 8 8   CREATININE 1.04 0.85 1.03 1.03  CALCIUM 10.5 9.7 9.7 9.9  MG  --   --  1.9  --   PHOS  --   --  2.4  --     Liver Function Tests:  Recent Labs Lab 02/04/14 1640 02/05/14 1025  AST 31 21  ALT 29 22  ALKPHOS 99 76  BILITOT 0.4 0.3  PROT 9.6* 8.0  ALBUMIN 4.4 3.3*   No results found for this basename: LIPASE, AMYLASE,  in the last 168 hours No results found for this basename: AMMONIA,  in the last 168 hours  CBC:  Recent Labs Lab 02/04/14 1640 02/05/14 1025  WBC 10.9* 5.4  NEUTROABS 2.2  --   HGB 14.1 13.0  HCT 40.6 36.6  MCV 96.0 94.3  PLT 258 195    Cardiac Enzymes:  Recent Labs Lab 02/05/14 1025  TROPONINI <0.30    Lipid Panel: No results found for this basename: CHOL, TRIG, HDL, CHOLHDL, VLDL, LDLCALC,  in the last 168 hours  CBG:  Recent Labs Lab 02/04/14 1639  GLUCAP 134*    Microbiology: Results for orders placed during the hospital encounter of 02/04/14  URINE CULTURE     Status: None   Collection Time    02/04/14  6:03 PM      Result Value Ref Range Status   Specimen Description URINE, CLEAN CATCH   Final   Special Requests NONE   Final   Culture  Setup Time     Final   Value: 02/04/2014 21:11     Performed at [facility] Count     Final   Value: >=100,000 COLONIES/ML     Performed at [facility]   Culture     Final   Value: ESCHERICHIA COLI     Performed at  [facility]   Report Status PENDING   Incomplete  CSF CULTURE     Status: None   Collection Time    02/04/14  8:24 PM      Result Value Ref Range Status   Specimen Description CSF   Final   Special Requests NONE   Final   Gram Stain     Final   Value: CYTOSPIN PREP WBC PRESENT, PREDOMINANTLY MONONUCLEAR     NO ORGANISMS SEEN     Gram Stain Report Called to,Read Back By and Verified With: Gram Stain Report Called to,Read Back By and Verified With: DUDLEY F/ED @2226  ON 02/04/14 BY KARCZEWSKI S. Performed by [facility]     Performed at [facility]   Culture     Final   Value: NO GROWTH 1 DAY     Performed at [facility]   Report Status PENDING   Incomplete  GRAM STAIN     Status: None   Collection Time    02/04/14  8:24 PM      Result Value Ref Range Status   Specimen Description CSF   Final   Special Requests NONE   Final  Gram Stain     Final   Value: NO ORGANISMS SEEN     WBC PRESENT, PREDOMINANTLY MONONUCLEAR     CYTOSPIN PREP     Gram Stain Report Called to,Read Back By and Verified With: DUDLEY,F/ED @2226  ON 02/04/14 BY KARCZEWSKI,S.   Report Status 02/04/2014 FINAL   Final    Coagulation Studies:  Recent Labs  02/04/14 1640 02/05/14 1025  LABPROT 14.3 14.1  INR 1.13 1.11    Imaging: Ct Head Wo Contrast  02/04/2014   CLINICAL DATA:  New onset of seizures. Altered mental status. History of HIV.  EXAM: CT HEAD WITHOUT CONTRAST  TECHNIQUE: Contiguous axial images were obtained from the base of the skull through the vertex without intravenous contrast.  COMPARISON:  CT scan dated 08/03/2011 and MRI dated 08/03/2011  FINDINGS: There is no acute intracranial hemorrhage, mass lesion, or infarction. There is diffuse cerebral cortical atrophy and mild cerebellar atrophy. There is extensive irregular periventricular white matter lucency with focal areas of more prominent white matter lucency high in the right parietal region and in the left  frontal lobe which correlate with abnormalities seen on the prior MRI which were thought to be atypical infection.  There is no osseous abnormality.  IMPRESSION: No acute intracranial abnormality. No change since the brain MRI dated 08/03/2011. Chronic white matter disease. The MRI of 08/03/2011 demonstrated probable atypical infection in the brain.   Electronically Signed   By: Geanie Cooley M.D.   On: 02/04/2014 17:48   Mr [name] ZO Contrast  02/05/2014   CLINICAL DATA:  43 year old female with confusion, agitation, seizure. Initial encounter. HIV.  EXAM: MRI HEAD WITHOUT AND WITH CONTRAST  TECHNIQUE: Multiplanar, multiecho pulse sequences of the brain and surrounding structures were obtained without and with intravenous contrast.  CONTRAST:  15mL MULTIHANCE GADOBENATE DIMEGLUMINE 529 MG/ML IV SOLN  COMPARISON:  Brain MRI 08/03/2011. Head CT without contrast 02/04/2014.  FINDINGS: Study is repeat intermittently degraded by motion artifact despite repeated imaging attempts.  Cerebral volume is mildly decreased since 04-01-11. Major intracranial vascular flow voids are stable. No restricted diffusion or evidence of acute infarction.  Progressed but more generalized cerebral white matter T2 and FLAIR hyperintensity. No associated mass effect, as was noted in some regions on the 2011/04/01 study. In some of those areas which were more severe in Apr 01, 2011, more pronounced white matter encephalomalacia has developed (e.g. Posterior right temporal lobe series 6, image 10 today versus series 5, image 10 previously, and right peri-Rolandic white matter image 16 today and image 16 previously).  Post-contrast images do not demonstrate abnormal parenchymal enhancement, but there is increased smooth dural thickening and enhancement (series 14, image 27). However, this patient has undergone recent lumbar puncture (yesterday, 02/04/2014). No ventriculomegaly. No intraventricular debris identified. No subependymal enhancement.  No gyral  edema. No intracranial mass effect. Patent basilar cisterns. No acute intracranial hemorrhage identified. Deep gray matter nuclei, brainstem and cerebellum are within normal limits. Visible internal auditory structures appear normal. Visualized paranasal sinuses and mastoids are clear. Visualized orbit soft tissues are within normal limits. Negative pituitary and cervicomedullary junction. Grossly negative visualized cervical spine.  Bone marrow signal is stable.  Negative scalp soft tissues.  IMPRESSION: 1. Increased mild diffuse dural thickening and enhancement, but may simply be a sequelae of the lumbar puncture on 02/04/2014. Correlate with CSF findings regarding the possibility of acute meningitis. 2. More generalized cerebral white matter abnormality since 2011/04/01, with some of the severe areas of involvement on that  piror MRI now demonstrating some encephalomalacia/gliosis. Superimposed generalized cerebral volume loss. The appearance now may reflect sequelae of the 2012 encephalitis plus chronic HIV encephalitis. PML not suspected at this time. 3. No acute infarct.   Electronically Signed   By: Augusto Gamble M.D.   On: 02/05/2014 21:21   Dg Chest Port 1 View  02/04/2014   CLINICAL DATA:  Altered mental status.  EXAM: PORTABLE CHEST - 1 VIEW  COMPARISON:  12/29/2013  FINDINGS: The patient unable to raise head as head and chin overlying the medial lung apices/upper mediastinum. Lungs are adequately inflated without focal consolidation or effusion. The cardiomediastinal silhouette and remainder of the exam is unchanged.  IMPRESSION: No active disease.   Electronically Signed   By: Elberta Fortis M.D.   On: 02/04/2014 17:15   Dg Fluoro Guide Lumbar Puncture  02/05/2014   CLINICAL DATA:  Seizure.  HIV.  EXAM: DIAGNOSTIC LUMBAR PUNCTURE UNDER FLUOROSCOPIC GUIDANCE  FLUOROSCOPY TIME:  0 min 21 seconds  PROCEDURE: Informed consent was obtained from the patient prior to the procedure, including potential complications of  headache, allergy, and pain. With the patient prone, the lower back was prepped with Betadine. 1% Lidocaine was used for local anesthesia. Lumbar puncture was performed at the L3-4 level using a 21 gauge needle with return of clear colorless CSF with an opening pressure of 14 cm water. Nineteenml of CSF were obtained for laboratory studies. The patient tolerated the procedure well and there were no apparent complications.  IMPRESSION: Lumbar puncture performed without complication.   Electronically Signed   By: Geanie Cooley M.D.   On: 02/05/2014 08:04       MEDICATIONS                                                                                                                        Scheduled: . cefTRIAXone (ROCEPHIN)  IV  1 g Intravenous Q24H  . Darunavir Ethanolate  800 mg Oral Q breakfast  . emtricitabine-tenofovir  1 tablet Oral Daily  . enoxaparin (LOVENOX) injection  40 mg Subcutaneous Q24H  . levETIRAcetam  500 mg Oral BID  . ritonavir  100 mg Oral Q breakfast    ASSESSMENT/PLAN:                                                                                                             New onset seizure. No further seizures while on Keppra 500 mg BID. EEG showed epileptiform activity.    Recommend:   1) Continue Keppra 500  mg BID at time of discharge and follow up with out patient neurology 2) infectious disease following 3) Continue to treat UTI.Marland Kitchen   No further recommendations.  Neurology S/O   Assessment and plan discussed with with attending physician and they are in agreement.    Felicie Morn PA-C [facility] [phone]  02/06/2014, 11:23 AM

## 2014-02-07 LAB — VARICELLA-ZOSTER BY PCR: Varicella-Zoster, PCR: NOT DETECTED

## 2014-02-08 LAB — CSF CULTURE: CULTURE: NO GROWTH

## 2014-02-09 ENCOUNTER — Encounter: Payer: Self-pay | Admitting: *Deleted

## 2014-02-10 ENCOUNTER — Encounter: Payer: 59 | Attending: Internal Medicine

## 2014-02-10 LAB — HSV(HERPES SMPLX VRS)ABS-I+II(IGG)-CSF

## 2014-02-13 ENCOUNTER — Other Ambulatory Visit (HOSPITAL_COMMUNITY)
Admission: RE | Admit: 2014-02-13 | Discharge: 2014-02-13 | Disposition: A | Discharge status: Home / Self Care | Payer: 59 | Source: Ambulatory Visit | Attending: Obstetrics and Gynecology | Admitting: Obstetrics and Gynecology

## 2014-02-13 ENCOUNTER — Other Ambulatory Visit: Payer: Self-pay | Admitting: Obstetrics and Gynecology

## 2014-02-13 DIAGNOSIS — R87810 Cervical high risk human papillomavirus (HPV) DNA test positive: Secondary | ICD-10-CM | POA: Insufficient documentation

## 2014-02-13 DIAGNOSIS — Z1151 Encounter for screening for human papillomavirus (HPV): Secondary | ICD-10-CM | POA: Insufficient documentation

## 2014-02-13 DIAGNOSIS — Z124 Encounter for screening for malignant neoplasm of cervix: Secondary | ICD-10-CM | POA: Insufficient documentation

## 2014-02-19 LAB — CMV (CYTOMEGALOVIRUS) DNA ULTRAQUANT, PCR: CMV DNA Quant: <200

## 2014-02-20 ENCOUNTER — Encounter: Payer: 59 | Admitting: Internal Medicine

## 2014-02-22 ENCOUNTER — Telehealth: Payer: Self-pay | Admitting: Neurology

## 2014-02-22 ENCOUNTER — Ambulatory Visit: Payer: Self-pay | Admitting: Neurology

## 2014-02-22 NOTE — Telephone Encounter (Signed)
Patient was a no show for new patient visit on 02/22/2014.

## 2014-03-21 ENCOUNTER — Other Ambulatory Visit (HOSPITAL_COMMUNITY): Payer: Self-pay | Admitting: Internal Medicine

## 2014-04-04 ENCOUNTER — Other Ambulatory Visit: Payer: Self-pay | Admitting: Obstetrics and Gynecology

## 2014-04-07 ENCOUNTER — Encounter: Payer: 59 | Attending: Internal Medicine

## 2014-04-07 DIAGNOSIS — Z713 Dietary counseling and surveillance: Secondary | ICD-10-CM | POA: Insufficient documentation

## 2014-04-07 DIAGNOSIS — E119 Type 2 diabetes mellitus without complications: Secondary | ICD-10-CM | POA: Insufficient documentation

## 2014-04-09 ENCOUNTER — Ambulatory Visit: Payer: 59 | Admitting: Internal Medicine

## 2014-04-11 NOTE — Progress Notes (Signed)
Patient was seen on 04/07/14 for the complete diabetes self-management series at the [facility]. Patient presents with her husband. I invited her husband to stay for the class, he declined. Patient left class early.  Current A1c = not available  Handouts given during class include:  Living Well with Diabetes book  Carb Counting and Meal Planning book  Meal Plan Card  Carbohydrate guide  Meal planning worksheet  Low Sodium Flavoring Tips  The diabetes portion plate  Low Carbohydrate Snack Suggestions  A1c to eAG Conversion Chart  Diabetes Medications  Stress Management  Diabetes Recommended Care Schedule  Diabetes Success Plan  Core Class Satisfaction Survey  The following learning objectives were addressed during this course:  Describe diabetes  common risk factors for diabetes  role of glucose and insulin  types of diabetes and pathophysiology  the relationship between diabetes and cardiovascular risk  the members of the Healthcare Team  the rationale for glucose monitoring  when to test glucose  their individual Target Range  the importance of logging glucose readings  how to interpret glucose readings  A1C target  correlation between A1c and eAG values  symptoms and treatment of high blood glucose  State symptoms and treatment of low blood glucose  Explain proper technique for glucose testing  Identifies proper sharps disposal  Describe the role of different macronutrients on glucose  Explain how carbohydrates affect blood glucose  State what foods contain the most carbohydrates  Demonstrate carbohydrate counting  Demonstrate how to read Nutrition Facts food label  Describe effects of various fats on heart health  Describe the importance of good nutrition for health and healthy eating strategies  Describe techniques for managing your shopping, cooking and meal planning  List strategies to follow meal  plan when dining out  Describe the effects of alcohol on glucose and how to use it safely   State the amount of activity recommended for healthy living   Describe activities suitable for individual needs   Identify ways to regularly incorporate activity into daily life   Identify barriers to activity and ways to over come these barriers  Identify diabetes medications being personally used and their primary action for lowering glucose and possible side effects   Describe role of stress on blood glucose and develop strategies to address psychosocial issues   Identify diabetes complications and ways to prevent them  Explain how to manage diabetes during illness  Goals:  Follow Diabetes Meal Plan as instructed  Eat 3 meals and 2 snacks, every 3-5 hrs  Limit carbohydrate intake to 45 grams carbohydrate/meal Limit carbohydrate intake to 15 grams carbohydrate/snack Add lean protein foods to meals/snacks  Aim for 15-30 mins of physical activity daily as tolerated   Plan: Gaffer with Link to [employer]

## 2014-04-16 ENCOUNTER — Ambulatory Visit (INDEPENDENT_AMBULATORY_CARE_PROVIDER_SITE_OTHER): Payer: 59 | Admitting: Internal Medicine

## 2014-04-16 ENCOUNTER — Encounter: Payer: Self-pay | Admitting: Internal Medicine

## 2014-04-16 VITALS — BP 104/71 | HR 94 | Temp 98.5°F | Ht 65.0 in | Wt 145.0 lb

## 2014-04-16 DIAGNOSIS — B20 Human immunodeficiency virus [HIV] disease: Secondary | ICD-10-CM

## 2014-04-16 LAB — COMPLETE METABOLIC PANEL WITH GFR
ALBUMIN: 4.4 g/dL (ref 3.5–5.2)
ALK PHOS: 102 U/L (ref 39–117)
ALT: 19 U/L (ref 0–35)
AST: 22 U/L (ref 0–37)
BUN: 15 mg/dL (ref 6–23)
CO2: 23 meq/L (ref 19–32)
Calcium: 10.3 mg/dL (ref 8.4–10.5)
Chloride: 104 mEq/L (ref 96–112)
Creat: 0.97 mg/dL (ref 0.50–1.10)
GFR, EST AFRICAN AMERICAN: 84 mL/min
GFR, Est Non African American: 73 mL/min
Glucose, Bld: 81 mg/dL (ref 70–99)
POTASSIUM: 3.6 meq/L (ref 3.5–5.3)
SODIUM: 138 meq/L (ref 135–145)
TOTAL PROTEIN: 8.6 g/dL — AB (ref 6.0–8.3)
Total Bilirubin: 0.6 mg/dL (ref 0.2–1.2)

## 2014-04-16 NOTE — Assessment & Plan Note (Addendum)
Labs today. RTC 3 months if ok.    [facility]

## 2014-04-16 NOTE — Progress Notes (Signed)
   Subjective:    Patient ID: [name], female    DOB: 06/20/1971, 43 y.o.   MRN: [id number]  HPI Here for follow up of HIV.  Last seen in march and complaint of swelling of hands and face.  Now seeing PCP.  Mildly elevated blood sugar.  No recent labs but reports excellent compliance and tolerance and no missed doses. She asks about getting a case manager to help with any issues that may arise including getting insurance on her own and food stamps.  Facial swelling better.      Review of Systems  Constitutional: Negative for fatigue and unexpected weight change.  Gastrointestinal: Negative for nausea and diarrhea.  Neurological: Negative for dizziness and light-headedness.       Objective:   Physical Exam  Constitutional: She appears well-developed and well-nourished. No distress.  HENT:  Mouth/Throat: No oropharyngeal exudate.  Eyes: Right eye exhibits no discharge. Left eye exhibits no discharge. No scleral icterus.  Cardiovascular: Normal rate, regular rhythm and normal heart sounds.   No murmur heard. Pulmonary/Chest: Effort normal and breath sounds normal. No respiratory distress. She has no wheezes.  Musculoskeletal: She exhibits no edema.  Lymphadenopathy:    She has no cervical adenopathy.  Skin: No rash noted.          Assessment & Plan:

## 2014-04-17 LAB — HIV-1 RNA QUANT-NO REFLEX-BLD: HIV 1 RNA Quant: <20 copies/mL (ref <20)

## 2014-04-17 LAB — T-HELPER CELL (CD4) - (RCID CLINIC ONLY)
CD4 % Helper T Cell: 10 % — ABNORMAL LOW (ref 33–55)
CD4 T Cell Abs: 180 /uL — ABNORMAL LOW (ref 400–2700)

## 2014-05-22 ENCOUNTER — Other Ambulatory Visit: Payer: Self-pay | Admitting: *Deleted

## 2014-05-22 DIAGNOSIS — B20 Human immunodeficiency virus [HIV] disease: Secondary | ICD-10-CM

## 2014-05-22 MED ORDER — DARUNAVIR ETHANOLATE 800 MG PO TABS: 800.0000 mg | ORAL_TABLET | Freq: Every day | ORAL | Status: DC

## 2014-05-22 MED ORDER — EMTRICITABINE-TENOFOVIR DF 200-300 MG PO TABS: 1.0000 | ORAL_TABLET | Freq: Every day | ORAL | Status: DC

## 2014-05-22 MED ORDER — RITONAVIR 100 MG PO CAPS: 100.0000 mg | ORAL_CAPSULE | Freq: Every day | ORAL | Status: DC

## 2014-05-22 NOTE — Telephone Encounter (Signed)
ADAP Application 

## 2014-06-07 ENCOUNTER — Other Ambulatory Visit: Payer: Self-pay | Admitting: Nurse Practitioner

## 2014-06-07 DIAGNOSIS — N644 Mastodynia: Secondary | ICD-10-CM

## 2014-06-13 ENCOUNTER — Other Ambulatory Visit: Payer: 59

## 2014-06-19 ENCOUNTER — Ambulatory Visit
Admission: RE | Admit: 2014-06-19 | Discharge: 2014-06-19 | Disposition: A | Discharge status: Home / Self Care | Payer: 59 | Source: Ambulatory Visit | Attending: Nurse Practitioner | Admitting: Nurse Practitioner

## 2014-06-19 DIAGNOSIS — N644 Mastodynia: Secondary | ICD-10-CM

## 2014-06-28 ENCOUNTER — Ambulatory Visit: Payer: 59

## 2014-07-03 ENCOUNTER — Other Ambulatory Visit: Payer: 59

## 2014-07-08 ENCOUNTER — Other Ambulatory Visit: Payer: Self-pay | Admitting: Internal Medicine

## 2014-07-12 ENCOUNTER — Ambulatory Visit: Payer: 59

## 2014-07-17 ENCOUNTER — Encounter: Payer: Self-pay | Admitting: Internal Medicine

## 2014-07-17 ENCOUNTER — Ambulatory Visit (INDEPENDENT_AMBULATORY_CARE_PROVIDER_SITE_OTHER): Payer: 59 | Admitting: Internal Medicine

## 2014-07-17 ENCOUNTER — Ambulatory Visit: Payer: 59

## 2014-07-17 VITALS — BP 119/84 | HR 74 | Temp 98.2°F | Wt 134.0 lb

## 2014-07-17 DIAGNOSIS — Z23 Encounter for immunization: Secondary | ICD-10-CM

## 2014-07-17 DIAGNOSIS — B20 Human immunodeficiency virus [HIV] disease: Secondary | ICD-10-CM

## 2014-07-17 NOTE — Assessment & Plan Note (Signed)
Seems to be doing well.  Will check labs to confirm.  Pharmacist to see pt.

## 2014-07-17 NOTE — Progress Notes (Signed)
   Subjective:    Patient ID: [name], female    DOB: 1971/03/29, 43 y.o.   MRN: [id number]  HPI Here for follow up of HIV.  Last seen in June and still with swelling of hands and face.  Now seeing PCP. No recent labs but reports excellent compliance and tolerance and no missed doses.  Waiting to get medicaid.  Saw dentist but unsure of follow up.  No further swelling of face.  On metformin for newly diagnosed diabetes.    Review of Systems  Constitutional: Negative for fatigue and unexpected weight change.  Gastrointestinal: Negative for nausea and diarrhea.  Neurological: Negative for dizziness and light-headedness.       Objective:   Physical Exam  Constitutional: She appears well-developed and well-nourished. No distress.  HENT:  Mouth/Throat: No oropharyngeal exudate.  Eyes: Right eye exhibits no discharge. Left eye exhibits no discharge. No scleral icterus.  Cardiovascular: Normal rate, regular rhythm and normal heart sounds.   No murmur heard. Pulmonary/Chest: Effort normal and breath sounds normal. No respiratory distress. She has no wheezes.  Musculoskeletal: She exhibits no edema.  Lymphadenopathy:    She has no cervical adenopathy.  Skin: No rash noted.          Assessment & Plan:

## 2014-07-18 ENCOUNTER — Other Ambulatory Visit: Payer: Self-pay | Admitting: Internal Medicine

## 2014-07-18 DIAGNOSIS — N926 Irregular menstruation, unspecified: Secondary | ICD-10-CM

## 2014-07-18 LAB — T-HELPER CELL (CD4) - (RCID CLINIC ONLY)
CD4 T CELL ABS: 190 /uL — AB (ref 400–2700)
CD4 T CELL HELPER: 11 % — AB (ref 33–55)

## 2014-07-18 LAB — HIV-1 RNA QUANT-NO REFLEX-BLD
HIV 1 RNA Quant: <20 copies/mL (ref <20)
HIV-1 RNA Quant, Log: <1.3 {Log} (ref <1.30)

## 2014-07-31 ENCOUNTER — Ambulatory Visit: Payer: 59

## 2014-08-06 ENCOUNTER — Encounter: Payer: 59 | Attending: Internal Medicine

## 2014-09-03 ENCOUNTER — Encounter: Payer: 59 | Admitting: Obstetrics and Gynecology

## 2014-09-03 ENCOUNTER — Encounter: Payer: Self-pay | Admitting: Internal Medicine

## 2014-10-19 ENCOUNTER — Other Ambulatory Visit: Payer: Self-pay | Admitting: Internal Medicine

## 2014-10-19 NOTE — Telephone Encounter (Signed)
MD please review medication profile for Keppra.  Previously rxed for pt during hospitalization last April 2015 due to seizure.  Keppra rx was discontinued during OV on 07/17/14.  Please advise about refills, dose, frequency and duration.

## 2014-10-19 NOTE — Telephone Encounter (Signed)
She should talk to her pcp

## 2014-10-22 NOTE — Telephone Encounter (Signed)
Spoke with [facility].  They will send refill request to the pt's PCP, Dr.  Lonzo CloudShamleffer.

## 2014-11-05 ENCOUNTER — Other Ambulatory Visit: Payer: Self-pay | Admitting: Internal Medicine

## 2014-11-09 ENCOUNTER — Other Ambulatory Visit: Payer: Self-pay | Admitting: Internal Medicine

## 2014-11-13 ENCOUNTER — Other Ambulatory Visit (INDEPENDENT_AMBULATORY_CARE_PROVIDER_SITE_OTHER): Payer: 59

## 2014-11-13 DIAGNOSIS — B20 Human immunodeficiency virus [HIV] disease: Secondary | ICD-10-CM

## 2014-11-13 LAB — COMPREHENSIVE METABOLIC PANEL
ALBUMIN: 3.8 g/dL (ref 3.5–5.2)
ALT: 14 U/L (ref 0–35)
AST: 22 U/L (ref 0–37)
Alkaline Phosphatase: 105 U/L (ref 39–117)
BUN: 10 mg/dL (ref 6–23)
CALCIUM: 9.4 mg/dL (ref 8.4–10.5)
CO2: 27 meq/L (ref 19–32)
Chloride: 104 mEq/L (ref 96–112)
Creat: 0.8 mg/dL (ref 0.50–1.10)
GLUCOSE: 78 mg/dL (ref 70–99)
POTASSIUM: 3.9 meq/L (ref 3.5–5.3)
Sodium: 137 mEq/L (ref 135–145)
Total Bilirubin: 0.5 mg/dL (ref 0.2–1.2)
Total Protein: 7.3 g/dL (ref 6.0–8.3)

## 2014-11-14 ENCOUNTER — Other Ambulatory Visit: Payer: Self-pay | Admitting: *Deleted

## 2014-11-14 ENCOUNTER — Other Ambulatory Visit: Payer: Self-pay | Admitting: Internal Medicine

## 2014-11-14 LAB — CBC WITH DIFFERENTIAL/PLATELET
Basophils Absolute: 0 10*3/uL (ref 0.0–0.1)
Basophils Relative: 0 % (ref 0–1)
EOS PCT: 7 % — AB (ref 0–5)
Eosinophils Absolute: 0.2 10*3/uL (ref 0.0–0.7)
HEMATOCRIT: 37.6 % (ref 36.0–46.0)
Hemoglobin: 12.6 g/dL (ref 12.0–15.0)
LYMPHS PCT: 43 % (ref 12–46)
Lymphs Abs: 1.3 10*3/uL (ref 0.7–4.0)
MCH: 31.3 pg (ref 26.0–34.0)
MCHC: 33.5 g/dL (ref 30.0–36.0)
MCV: 93.5 fL (ref 78.0–100.0)
MONO ABS: 0.3 10*3/uL (ref 0.1–1.0)
MPV: 10.1 fL (ref 8.6–12.4)
Monocytes Relative: 9 % (ref 3–12)
NEUTROS PCT: 41 % — AB (ref 43–77)
Neutro Abs: 1.2 10*3/uL — ABNORMAL LOW (ref 1.7–7.7)
Platelets: 200 10*3/uL (ref 150–400)
RBC: 4.02 MIL/uL (ref 3.87–5.11)
RDW: 13.8 % (ref 11.5–15.5)
WBC: 3 10*3/uL — AB (ref 4.0–10.5)

## 2014-11-14 LAB — T-HELPER CELL (CD4) - (RCID CLINIC ONLY)
CD4 T CELL ABS: 160 /uL — AB (ref 400–2700)
CD4 T CELL HELPER: 13 % — AB (ref 33–55)

## 2014-11-15 LAB — HIV-1 RNA QUANT-NO REFLEX-BLD
HIV 1 RNA QUANT: 97 {copies}/mL — AB (ref <20)
HIV-1 RNA QUANT, LOG: 1.99 {Log} — AB (ref <1.30)

## 2014-11-27 ENCOUNTER — Encounter: Payer: Self-pay | Admitting: Internal Medicine

## 2014-11-27 ENCOUNTER — Ambulatory Visit (INDEPENDENT_AMBULATORY_CARE_PROVIDER_SITE_OTHER): Payer: 59 | Admitting: Internal Medicine

## 2014-11-27 VITALS — BP 104/73 | HR 89 | Temp 97.3°F | Wt 125.0 lb

## 2014-11-27 DIAGNOSIS — F329 Major depressive disorder, single episode, unspecified: Secondary | ICD-10-CM

## 2014-11-27 DIAGNOSIS — F32A Depression, unspecified: Secondary | ICD-10-CM

## 2014-11-27 DIAGNOSIS — B20 Human immunodeficiency virus [HIV] disease: Secondary | ICD-10-CM

## 2014-11-27 MED ORDER — EMTRICITABINE-TENOFOVIR DF 200-300 MG PO TABS: 1.0000 | ORAL_TABLET | Freq: Every day | ORAL | Status: DC

## 2014-11-27 MED ORDER — CITALOPRAM HYDROBROMIDE 10 MG PO TABS: 10.0000 mg | ORAL_TABLET | Freq: Every day | ORAL | Status: DC

## 2014-11-27 MED ORDER — DOLUTEGRAVIR SODIUM 50 MG PO TABS: 50.0000 mg | ORAL_TABLET | Freq: Every day | ORAL | Status: DC

## 2014-11-27 NOTE — Assessment & Plan Note (Signed)
She continued to complain of depression. I did discuss with her the options including counseling, particular since a lot of it is situational. Though I have noted that she has been depressed and most visits to me. I discussed with her that medication may help but she needs counseling to really benefit her. I discussed that if she starts on a medication, she will need to have follow-up with a counselor to make sure she is doing well on the medication. She then is going to start on Celexa and she will follow-up with our counselor for depression.

## 2014-11-27 NOTE — Progress Notes (Signed)
   Subjective:    Patient ID: [name], female    DOB: 12/20/1970, 44 y.o.   MRN: [id number]  HPI Here for follow up of HIV.  She continues on Prezista, Norvir and Truvada. Her CD4 count is down to 160 and her viral load is 97 copies.  She endorses good compliance. Despite this, her CD4 has decreased. She also complains of depression. She has only that her 43 year old daughter has recently become pregnant and is now living with her. She also has been depressed prior to this. She is asking for depression medication. She also is frustrated that she was unable to see her primary physician when she had acute sinusitis because she reportedly owes money.   Review of Systems  Constitutional: Negative for fatigue and unexpected weight change.  Gastrointestinal: Negative for nausea and diarrhea.  Neurological: Negative for dizziness and light-headedness.       Objective:   Physical Exam  Constitutional: She appears well-developed and well-nourished. No distress.  HENT:  Mouth/Throat: No oropharyngeal exudate.  Eyes: Right eye exhibits no discharge. Left eye exhibits no discharge. No scleral icterus.  Cardiovascular: Normal rate, regular rhythm and normal heart sounds.   No murmur heard. Pulmonary/Chest: Effort normal and breath sounds normal. No respiratory distress. She has no wheezes.  Musculoskeletal: She exhibits no edema.  Lymphadenopathy:    She has no cervical adenopathy.  Skin: No rash noted.          Assessment & Plan:

## 2014-11-27 NOTE — Assessment & Plan Note (Signed)
I am concerned with her compliance, though she does and she always takes it. I'm going to try to streamline her regimen with Tivicay and Truvada, to see if this has any benefit. I did again reiterate the importance of compliance. I will recheck in 3 months.

## 2014-12-13 ENCOUNTER — Other Ambulatory Visit: Payer: Self-pay | Admitting: Internal Medicine

## 2014-12-14 ENCOUNTER — Other Ambulatory Visit: Payer: Self-pay | Admitting: Licensed Clinical Social Worker

## 2014-12-25 ENCOUNTER — Ambulatory Visit: Payer: 59

## 2014-12-25 DIAGNOSIS — F4310 Post-traumatic stress disorder, unspecified: Secondary | ICD-10-CM
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
## 2014-12-28 ENCOUNTER — Encounter: Payer: Self-pay | Admitting: General Practice

## 2015-01-10 ENCOUNTER — Other Ambulatory Visit: Payer: Self-pay | Admitting: *Deleted

## 2015-01-10 DIAGNOSIS — Z113 Encounter for screening for infections with a predominantly sexual mode of transmission: Secondary | ICD-10-CM

## 2015-04-25 NOTE — Progress Notes (Signed)
Patient ID: [name], female   DOB: 1971-05-30, 44 y.o.   MRN: [id number] Case Manager Neila Gear discharged client from [facility] Case [facility] program on 04-17-15 because her file expired. Case Manager will re-enroll client into [facility] or [facility] program when able.

## 2015-05-11 ENCOUNTER — Other Ambulatory Visit: Payer: Self-pay | Admitting: Infectious Diseases

## 2015-05-25 ENCOUNTER — Other Ambulatory Visit: Payer: Self-pay | Admitting: Infectious Diseases

## 2015-06-21 ENCOUNTER — Other Ambulatory Visit: Payer: Self-pay | Admitting: Internal Medicine

## 2015-07-23 ENCOUNTER — Other Ambulatory Visit: Payer: Self-pay | Admitting: Internal Medicine

## 2015-07-23 ENCOUNTER — Telehealth: Payer: Self-pay | Admitting: *Deleted

## 2015-07-23 NOTE — Telephone Encounter (Signed)
Patient overdue for a visit. She will call back for a lab appointment, but she is scheduled for follow up with Dr. Luciana Axe on 11/10 8:45.   Patient requested refills. At last visit, she was supposed to be simplified to Tivicay and Truvada, but thinks she has been taking daily Tivicay, Truvada, Prezista, and Norvir.  Per [facility], she has been taking Truvada, Norvir, Prezista; last picked it up 07/22/15 and has been filling regimen this consistently.  Please advise which regimen she should be using. Andree Coss, RN

## 2015-07-24 ENCOUNTER — Telehealth: Payer: Self-pay | Admitting: *Deleted

## 2015-07-24 ENCOUNTER — Other Ambulatory Visit: Payer: Self-pay | Admitting: *Deleted

## 2015-07-24 DIAGNOSIS — B20 Human immunodeficiency virus [HIV] disease: Secondary | ICD-10-CM

## 2015-07-24 MED ORDER — DOLUTEGRAVIR SODIUM 50 MG PO TABS
50.0000 mg | ORAL_TABLET | Freq: Every day | ORAL | Status: DC
Start: 2015-07-24 — End: 2015-11-05

## 2015-07-24 MED ORDER — EMTRICITABINE-TENOFOVIR DF 200-300 MG PO TABS
1.0000 | ORAL_TABLET | Freq: Every day | ORAL | Status: DC
Start: 2015-07-24 — End: 2015-11-05

## 2015-07-24 NOTE — Telephone Encounter (Signed)
Needing lab appt prior to MD visit.  Pt placed on lab schedule for 09/03/15.

## 2015-07-24 NOTE — Telephone Encounter (Signed)
Just tivicay and truvada.  thanks

## 2015-09-03 ENCOUNTER — Other Ambulatory Visit: Payer: 59

## 2015-09-10 ENCOUNTER — Other Ambulatory Visit: Payer: 59

## 2015-09-12 ENCOUNTER — Telehealth: Payer: Self-pay | Admitting: *Deleted

## 2015-09-12 ENCOUNTER — Encounter: Payer: Self-pay | Admitting: Internal Medicine

## 2015-09-12 ENCOUNTER — Ambulatory Visit (INDEPENDENT_AMBULATORY_CARE_PROVIDER_SITE_OTHER): Payer: 59 | Admitting: Internal Medicine

## 2015-09-12 VITALS — BP 112/72 | HR 60 | Temp 98.0°F | Ht 62.0 in | Wt 123.0 lb

## 2015-09-12 DIAGNOSIS — B20 Human immunodeficiency virus [HIV] disease: Secondary | ICD-10-CM | POA: Diagnosis not present

## 2015-09-12 DIAGNOSIS — R630 Anorexia: Secondary | ICD-10-CM

## 2015-09-12 DIAGNOSIS — Z113 Encounter for screening for infections with a predominantly sexual mode of transmission: Secondary | ICD-10-CM | POA: Diagnosis not present

## 2015-09-12 DIAGNOSIS — Z79899 Other long term (current) drug therapy: Secondary | ICD-10-CM | POA: Diagnosis not present

## 2015-09-12 DIAGNOSIS — F329 Major depressive disorder, single episode, unspecified: Secondary | ICD-10-CM | POA: Diagnosis not present

## 2015-09-12 DIAGNOSIS — Z23 Encounter for immunization: Secondary | ICD-10-CM

## 2015-09-12 DIAGNOSIS — F32A Depression, unspecified: Secondary | ICD-10-CM

## 2015-09-12 LAB — LIPID PANEL
CHOL/HDL RATIO: 3.6 ratio (ref <=5.0)
CHOLESTEROL: 243 mg/dL — AB (ref 125–200)
HDL: 68 mg/dL (ref >=46)
LDL Cholesterol: 160 mg/dL — ABNORMAL HIGH (ref <130)
Triglycerides: 76 mg/dL (ref <150)
VLDL: 15 mg/dL (ref <30)

## 2015-09-12 LAB — COMPLETE METABOLIC PANEL WITH GFR
ALBUMIN: 4.3 g/dL (ref 3.6–5.1)
ALK PHOS: 96 U/L (ref 33–115)
ALT: 14 U/L (ref 6–29)
AST: 19 U/L (ref 10–30)
BILIRUBIN TOTAL: 0.3 mg/dL (ref 0.2–1.2)
BUN: 15 mg/dL (ref 7–25)
CO2: 30 mmol/L (ref 20–31)
Calcium: 10.3 mg/dL — ABNORMAL HIGH (ref 8.6–10.2)
Chloride: 104 mmol/L (ref 98–110)
Creat: 0.87 mg/dL (ref 0.50–1.10)
GFR, EST NON AFRICAN AMERICAN: 82 mL/min (ref >=60)
Glucose, Bld: 103 mg/dL — ABNORMAL HIGH (ref 65–99)
Potassium: 4.5 mmol/L (ref 3.5–5.3)
Sodium: 139 mmol/L (ref 135–146)
TOTAL PROTEIN: 8 g/dL (ref 6.1–8.1)

## 2015-09-12 LAB — CBC WITH DIFFERENTIAL/PLATELET
BASOS ABS: 0 10*3/uL (ref 0.0–0.1)
BASOS PCT: 0 % (ref 0–1)
EOS ABS: 0.2 10*3/uL (ref 0.0–0.7)
Eosinophils Relative: 8 % — ABNORMAL HIGH (ref 0–5)
HCT: 39.7 % (ref 36.0–46.0)
Hemoglobin: 13.5 g/dL (ref 12.0–15.0)
Lymphocytes Relative: 42 % (ref 12–46)
Lymphs Abs: 1.2 10*3/uL (ref 0.7–4.0)
MCH: 31.2 pg (ref 26.0–34.0)
MCHC: 34 g/dL (ref 30.0–36.0)
MCV: 91.7 fL (ref 78.0–100.0)
MPV: 10.5 fL (ref 8.6–12.4)
Monocytes Absolute: 0.3 10*3/uL (ref 0.1–1.0)
Monocytes Relative: 11 % (ref 3–12)
NEUTROS PCT: 39 % — AB (ref 43–77)
Neutro Abs: 1.1 10*3/uL — ABNORMAL LOW (ref 1.7–7.7)
PLATELETS: 181 10*3/uL (ref 150–400)
RBC: 4.33 MIL/uL (ref 3.87–5.11)
RDW: 12.6 % (ref 11.5–15.5)
WBC: 2.9 10*3/uL — ABNORMAL LOW (ref 4.0–10.5)

## 2015-09-12 NOTE — Progress Notes (Signed)
   Subjective:    Patient ID: [name], female    DOB: 05/09/1971, 44 y.o.   MRN: [id number]  HPI Here for follow up of HIV.     I changed her to Tivicay with Truvada last visit but she has been taking that with Prezista and norvir as well.  No labs prior to this visit.  Missed previous follow up.  She endorses good compliance.  Complains of some dizziness.  Now has a grandchild living with her and her daughter.  Distressed by this, no father involved.  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  Complains of poor appetite and can't afford ensure supplements.     Review of Systems  Constitutional: Negative for fatigue and unexpected weight change.  Gastrointestinal: Negative for nausea and diarrhea.  Neurological: Negative for dizziness and light-headedness.       Objective:   Physical Exam  Constitutional: She appears well-developed and well-nourished. No distress.  HENT:  Mouth/Throat: No oropharyngeal exudate.  Eyes: Right eye exhibits no discharge. Left eye exhibits no discharge. No scleral icterus.  Cardiovascular: Normal rate, regular rhythm and normal heart sounds.   No murmur heard. Pulmonary/Chest: Effort normal and breath sounds normal. No respiratory distress. She has no wheezes.  Musculoskeletal: She exhibits no edema.  Lymphadenopathy:    She has no cervical adenopathy.  Skin: No rash noted.    Social History   Social History  . Marital Status: Married    Spouse Name: N/A  . Number of Children: N/A  . Years of Education: N/A   Occupational History  . Not on file.   Social History Main Topics  . Smoking status: Never Smoker   . Smokeless tobacco: Never Used  . Alcohol Use: No  . Drug Use: No  . Sexual Activity:    Partners: Male    Birth Control/ Protection: None     Comment: pt given condoms   Other Topics Concern  . Not on file   Social History Narrative        Assessment & Plan:

## 2015-09-12 NOTE — Addendum Note (Signed)
Addended by: Andree CossHOWELL, Jamiaya Bina M on: 09/12/2015 03:24 PM   Modules accepted: Orders

## 2015-09-12 NOTE — Assessment & Plan Note (Addendum)
I again offered counseling but she feels she gets enough from her church No SI

## 2015-09-12 NOTE — Assessment & Plan Note (Signed)
Labs today, hopefully doing well. RTC 4 months unless concerns.

## 2015-09-12 NOTE — Telephone Encounter (Signed)
Patient on [employer] insurance, must use a [facility] pharmacy.  Canc[facility]. Updated her profile with correct regimen of tivicay and truvada, but asked that they be put on her profile just in case she ever needed to use ADAP again.   RN canceled norvir and prezista prescriptions at [facility].  Patient was overdue for refills, RN asked them to refill her Tivicay and Truvada as patient was out of tivicay today.    Andree CossHowell, Michelle M, RN

## 2015-09-12 NOTE — Assessment & Plan Note (Signed)
I discussed she should continue to eat, even without an appetite.  Ensure not needed, she just needs to eat, can take a MVI.

## 2015-09-13 LAB — HIV-1 RNA QUANT-NO REFLEX-BLD
HIV 1 RNA Quant: <20 copies/mL (ref <20)
HIV-1 RNA Quant, Log: <1.3 Log copies/mL (ref <1.30)

## 2015-09-13 LAB — RPR

## 2015-09-13 LAB — T-HELPER CELL (CD4) - (RCID CLINIC ONLY)
CD4 % Helper T Cell: 13 % — ABNORMAL LOW (ref 33–55)
CD4 T Cell Abs: 170 /uL — ABNORMAL LOW (ref 400–2700)

## 2015-09-16 LAB — URINE CYTOLOGY ANCILLARY ONLY
CHLAMYDIA, DNA PROBE: NEGATIVE
Neisseria Gonorrhea: NEGATIVE

## 2015-10-07 ENCOUNTER — Telehealth: Payer: Self-pay | Admitting: *Deleted

## 2015-10-07 NOTE — Telephone Encounter (Signed)
RN [facility], after reviewing the chart I noted the patient is currently undetectable (11/10) but I still wanted to offer any additional assistance/support to keep the patient on track. RN contacted the patient several times without a answer and mailbox for voicemails are full. Plan is to reach out to the patient again at a later time.

## 2015-10-17 ENCOUNTER — Other Ambulatory Visit (HOSPITAL_COMMUNITY)
Admission: RE | Admit: 2015-10-17 | Discharge: 2015-10-17 | Disposition: A | Discharge status: Home / Self Care | Payer: 59 | Source: Ambulatory Visit | Attending: Obstetrics and Gynecology | Admitting: Obstetrics and Gynecology

## 2015-10-17 ENCOUNTER — Other Ambulatory Visit: Payer: Self-pay | Admitting: Obstetrics and Gynecology

## 2015-10-17 DIAGNOSIS — Z113 Encounter for screening for infections with a predominantly sexual mode of transmission: Secondary | ICD-10-CM | POA: Diagnosis present

## 2015-10-17 DIAGNOSIS — Z01411 Encounter for gynecological examination (general) (routine) with abnormal findings: Secondary | ICD-10-CM | POA: Insufficient documentation

## 2015-10-18 LAB — CYTOLOGY - PAP

## 2015-11-02 ENCOUNTER — Other Ambulatory Visit: Payer: Self-pay | Admitting: Internal Medicine

## 2015-11-05 ENCOUNTER — Other Ambulatory Visit: Payer: Self-pay | Admitting: *Deleted

## 2015-11-05 DIAGNOSIS — B20 Human immunodeficiency virus [HIV] disease: Secondary | ICD-10-CM

## 2015-11-05 MED ORDER — EMTRICITABINE-TENOFOVIR DF 200-300 MG PO TABS: 1.0000 | ORAL_TABLET | Freq: Every day | ORAL | Status: DC

## 2015-11-05 MED ORDER — DOLUTEGRAVIR SODIUM 50 MG PO TABS: 50.0000 mg | ORAL_TABLET | Freq: Every day | ORAL | Status: DC

## 2015-12-04 ENCOUNTER — Other Ambulatory Visit: Payer: Self-pay | Admitting: Internal Medicine

## 2015-12-09 ENCOUNTER — Encounter (HOSPITAL_COMMUNITY): Payer: Self-pay

## 2015-12-09 ENCOUNTER — Emergency Department (INDEPENDENT_AMBULATORY_CARE_PROVIDER_SITE_OTHER)
Admission: EM | Admit: 2015-12-09 | Discharge: 2015-12-09 | Disposition: A | Discharge status: Home / Self Care | Payer: Worker's Compensation | Source: Home / Self Care | Attending: Emergency Medicine | Admitting: Emergency Medicine

## 2015-12-09 DIAGNOSIS — S40811A Abrasion of right upper arm, initial encounter: Secondary | ICD-10-CM | POA: Diagnosis not present
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
## 2015-12-09 NOTE — ED Provider Notes (Signed)
CSN: [id number]     Arrival date & time 12/09/15  1627 History   First MD Initiated Contact with Patient 12/09/15 1818     Chief Complaint  Patient presents with  . 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 This occurred approximately 11:00 today. Close examination reveals difficult to visualize epidermal, linear scratch. There is no bleeding. No open areas.   Past Medical History  Diagnosis Date  . Abnormal Pap smear   . Depression   . HIV disease (HCC)   . Back pain    History reviewed. No pertinent past surgical history. No family history on file. Social History  Substance Use Topics  . Smoking status: Never Smoker   . Smokeless tobacco: Never Used  . Alcohol Use: No   OB History    Gravida Para Term Preterm AB TAB SAB Ectopic Multiple Living   Review of Systems  Constitutional: Negative.   HENT: Negative.   Genitourinary: Negative.   Skin: Positive for wound.  Neurological: Negative.   All other systems reviewed and are negative.   Allergies  Review of patient's allergies indicates no known allergies.  Home Medications   Prior to Admission medications   Medication Sig Start Date End Date Taking? Authorizing Provider  citalopram (CELEXA) 10 MG tablet TAKE 1 TABLET BY MOUTH ONCE DAILY 12/04/15   Gardiner Barefoot, MD  dolutegravir (TIVICAY) 50 MG tablet Take 1 tablet (50 mg total) by mouth daily. 11/05/15   Gardiner Barefoot, MD  emtricitabine-tenofovir (TRUVADA) 200-300 MG tablet Take 1 tablet by mouth daily. 11/05/15 11/04/16  Gardiner Barefoot, MD  ezetimibe (ZETIA) 10 MG tablet Take 10 mg by mouth daily.    Historical Provider, MD   Meds Ordered and Administered this Visit  Medications - No data to display  BP 100/68 mmHg  Pulse 67  Temp(Src) 97.8 F (36.6 C) (Oral)  Resp  16  SpO2 99% No data found.   Physical Exam  Constitutional: She appears well-developed and well-nourished. No distress.  Neck: Normal range of motion. Neck supple.  Cardiovascular: Normal rate.   Pulmonary/Chest: Effort normal. No respiratory distress.  Abdominal: Soft. She exhibits no distension.  Musculoskeletal: Normal range of motion. She exhibits no edema.  Neurological: She is alert. No cranial nerve deficit. She exhibits normal muscle tone.  Skin: Skin is warm and dry.  There is a very light epidermal defect in  a linear pattern involving the epidermis. It is approximately 3 cm in length. It is best seen under magnification. No bleeding, no redness or other discoloration. No drainage.   Psychiatric: She has a normal mood and affect.  Nursing note and vitals reviewed.   ED Course  Procedures (including critical care time)  Labs Review Labs Reviewed - No data to display  Imaging Review No results found.   Visual Acuity Review  Right Eye Distance:   Left Eye Distance:   Bilateral Distance:    Right Eye Near:   Left Eye Near:    Bilateral Near:         MDM   1. Abrasion of arm, right, initial encounter    Clean with soap and water daily. Patient up-to-date on tetanus according to her testimony. No actual wound is observed. Watch for signs of infection. For worsening may return.  Hayden Rasmussen, NP 12/09/15 [phone]

## 2015-12-09 NOTE — Discharge Instructions (Signed)
Abrasion °An abrasion is a cut or scrape on the outer surface of your skin. An abrasion does not extend through all of the layers of your skin. It is important to care for your abrasion properly to prevent infection. °CAUSES °Most abrasions are caused by falling on or gliding across the ground or another surface. When your skin rubs on something, the outer and inner layer of skin rubs off.  °SYMPTOMS °A cut or scrape is the main symptom of this condition. The scrape may be bleeding, or it may appear red or pink. If there was an associated fall, there may be an underlying bruise. °DIAGNOSIS °An abrasion is diagnosed with a physical exam. °TREATMENT °Treatment for this condition depends on how large and deep the abrasion is. Usually, your abrasion will be cleaned with water and mild soap. This removes any dirt or debris that may be stuck. An antibiotic ointment may be applied to the abrasion to help prevent infection. A bandage (dressing) may be placed on the abrasion to keep it clean. °You may also need a tetanus shot. °HOME CARE INSTRUCTIONS °Medicines °· Take or apply medicines only as directed by your health care provider. °· If you were prescribed an antibiotic ointment, finish all of it even if you start to feel better. °Wound Care °· Clean the wound with mild soap and water 2-3 times per day or as directed by your health care provider. Pat your wound dry with a clean towel. Do not rub it. °· There are many different ways to close and cover a wound. Follow instructions from your health care provider about: °· Wound care. °· Dressing changes and removal. °· Check your wound every day for signs of infection. Watch for: °· Redness, swelling, or pain. °· Fluid, blood, or pus. °General Instructions °· Keep the dressing dry as directed by your health care provider. Do not take baths, swim, use a hot tub, or do anything that would put your wound underwater until your health care provider approves. °· If there is  swelling, raise (elevate) the injured area above the level of your heart while you are sitting or lying down. °· Keep all follow-up visits as directed by your health care provider. This is important. °SEEK MEDICAL CARE IF: °· You received a tetanus shot and you have swelling, severe pain, redness, or bleeding at the injection site. °· Your pain is not controlled with medicine. °· You have increased redness, swelling, or pain at the site of your wound. °SEEK IMMEDIATE MEDICAL CARE IF: °· You have a red streak going away from your wound. °· You have a fever. °· You have fluid, blood, or pus coming from your wound. °· You notice a bad smell coming from your wound or your dressing. °  °This information is not intended to replace advice given to you by your health care provider. Make sure you discuss any questions you have with your health care provider. °  °Document Released: 07/29/2005 Document Revised: 07/10/2015 Document Reviewed: 10/17/2014 °Elsevier Interactive Patient Education ©2016 Elsevier Inc[employer] ° °Wound Care °Taking care of your wound properly can help to prevent pain and infection. It can also help your wound to heal more quickly.  °HOW TO CARE FOR YOUR WOUND  °· Take or apply over-the-counter and prescription medicines only as told by your health care provider. °· If you were prescribed antibiotic medicine, take or apply it as told by your health care provider. Do not stop using the antibiotic even if your condition improves. °·   Clean the wound each day or as told by your health care provider. °¨ Wash the wound with mild soap and water. °¨ Rinse the wound with water to remove all soap. °¨ Pat the wound dry with a clean towel. Do not rub it. °· There are many different ways to close and cover a wound. For example, a wound can be covered with stitches (sutures), skin glue, or adhesive strips. Follow instructions from your health care provider about: °¨ How to take care of your wound. °¨ When and how you should  change your bandage (dressing). °¨ When you should remove your dressing. °¨ Removing whatever was used to close your wound. °· Check your wound every day for signs of infection. Watch for: °¨ Redness, swelling, or pain. °¨ Fluid, blood, or pus. °· Keep the dressing dry until your health care provider says it can be removed. Do not take baths, swim, use a hot tub, or do anything that would put your wound underwater until your health care provider approves. °· Raise (elevate) the injured area above the level of your heart while you are sitting or lying down. °· Do not scratch or pick at the wound. °· Keep all follow-up visits as told by your health care provider. This is important. °SEEK MEDICAL CARE IF: °· You received a tetanus shot and you have swelling, severe pain, redness, or bleeding at the injection site. °· You have a fever. °· Your pain is not controlled with medicine. °· You have increased redness, swelling, or pain at the site of your wound. °· You have fluid, blood, or pus coming from your wound. °· You notice a bad smell coming from your wound or your dressing. °SEEK IMMEDIATE MEDICAL CARE IF: °· You have a red streak going away from your wound. °  °This information is not intended to replace advice given to you by your health care provider. Make sure you discuss any questions you have with your health care provider. °  °Document Released: 07/28/2008 Document Revised: 03/05/2015 Document Reviewed: 10/15/2014 °Elsevier Interactive Patient Education ©2016 Elsevier Inc. ° °

## 2016-01-08 ENCOUNTER — Telehealth: Payer: Self-pay | Admitting: *Deleted

## 2016-01-08 NOTE — Telephone Encounter (Signed)
RN contacted the patient again to offer services. RN spoke with the patient and after introducing myself and explaining the reason for my call. [name] stated she has just been so busy at work and with her grandbaby. [name] asked to have a appt scheduled with Dr Luciana Axeomer. RN scheduled a visit with Dr Luciana Axeomer for 03/03/16 and informed [name] that I would love to be available to her to ensure she has access to her medications and to be a support for her. [name] stated she has her medications and have been taking it every day without any problems.  RN informed that patient that I would love to meet her face to face on her appt date. Patient stated that would be fine. Plan is to meet the patient on 05/02, RN also texted the patient my number with her approval just to be sure she has my contact information

## 2016-03-03 ENCOUNTER — Other Ambulatory Visit: Payer: Self-pay | Admitting: *Deleted

## 2016-03-03 ENCOUNTER — Ambulatory Visit: Payer: Self-pay | Admitting: Internal Medicine

## 2016-03-25 ENCOUNTER — Other Ambulatory Visit: Payer: 59

## 2016-03-25 ENCOUNTER — Other Ambulatory Visit: Payer: Self-pay | Admitting: *Deleted

## 2016-03-25 ENCOUNTER — Other Ambulatory Visit: Payer: Self-pay | Admitting: Internal Medicine

## 2016-03-25 DIAGNOSIS — Z21 Asymptomatic human immunodeficiency virus [HIV] infection status: Secondary | ICD-10-CM | POA: Diagnosis not present

## 2016-03-25 DIAGNOSIS — B20 Human immunodeficiency virus [HIV] disease: Secondary | ICD-10-CM

## 2016-03-25 LAB — CBC WITH DIFFERENTIAL/PLATELET
BASOS PCT: 0 %
Basophils Absolute: 0 cells/uL (ref 0–200)
Eosinophils Absolute: 288 cells/uL (ref 15–500)
Eosinophils Relative: 8 %
HCT: 38.6 % (ref 35.0–45.0)
Hemoglobin: 12.8 g/dL (ref 11.7–15.5)
LYMPHS PCT: 40 %
Lymphs Abs: 1440 cells/uL (ref 850–3900)
MCH: 30.6 pg (ref 27.0–33.0)
MCHC: 33.2 g/dL (ref 32.0–36.0)
MCV: 92.3 fL (ref 80.0–100.0)
MONO ABS: 396 {cells}/uL (ref 200–950)
MONOS PCT: 11 %
MPV: 10.6 fL (ref 7.5–12.5)
Neutro Abs: 1476 cells/uL — ABNORMAL LOW (ref 1500–7800)
Neutrophils Relative %: 41 %
PLATELETS: 186 10*3/uL (ref 140–400)
RBC: 4.18 MIL/uL (ref 3.80–5.10)
RDW: 14.3 % (ref 11.0–15.0)
WBC: 3.6 10*3/uL — AB (ref 3.8–10.8)

## 2016-03-25 MED ORDER — EMTRICITABINE-TENOFOVIR DF 200-300 MG PO TABS: 1.0000 | ORAL_TABLET | Freq: Every day | ORAL | Status: DC

## 2016-03-25 MED ORDER — DOLUTEGRAVIR SODIUM 50 MG PO TABS: 50.0000 mg | ORAL_TABLET | Freq: Every day | ORAL | Status: DC

## 2016-03-26 LAB — HIV-1 RNA QUANT-NO REFLEX-BLD
HIV 1 RNA QUANT: 28 {copies}/mL — AB (ref <20)
HIV-1 RNA Quant, Log: 1.45 Log copies/mL — ABNORMAL HIGH (ref <1.30)

## 2016-03-26 LAB — COMPREHENSIVE METABOLIC PANEL
ALK PHOS: 79 U/L (ref 33–115)
ALT: 18 U/L (ref 6–29)
AST: 25 U/L (ref 10–30)
Albumin: 4.4 g/dL (ref 3.6–5.1)
BILIRUBIN TOTAL: 0.3 mg/dL (ref 0.2–1.2)
BUN: 17 mg/dL (ref 7–25)
CO2: 26 mmol/L (ref 20–31)
Calcium: 9.7 mg/dL (ref 8.6–10.2)
Chloride: 105 mmol/L (ref 98–110)
Creat: 1.07 mg/dL (ref 0.50–1.10)
GLUCOSE: 82 mg/dL (ref 65–99)
Potassium: 4 mmol/L (ref 3.5–5.3)
Sodium: 137 mmol/L (ref 135–146)
Total Protein: 7.6 g/dL (ref 6.1–8.1)

## 2016-03-26 LAB — T-HELPER CELL (CD4) - (RCID CLINIC ONLY)
CD4 T CELL ABS: 180 /uL — AB (ref 400–2700)
CD4 T CELL HELPER: 13 % — AB (ref 33–55)

## 2016-04-27 ENCOUNTER — Ambulatory Visit: Payer: Self-pay | Admitting: Internal Medicine

## 2016-04-29 ENCOUNTER — Other Ambulatory Visit: Payer: Self-pay | Admitting: *Deleted

## 2016-04-29 DIAGNOSIS — B20 Human immunodeficiency virus [HIV] disease: Secondary | ICD-10-CM

## 2016-04-29 MED ORDER — EMTRICITABINE-TENOFOVIR DF 200-300 MG PO TABS: 1.0000 | ORAL_TABLET | Freq: Every day | ORAL | Status: DC

## 2016-04-29 MED ORDER — DOLUTEGRAVIR SODIUM 50 MG PO TABS: 50.0000 mg | ORAL_TABLET | Freq: Every day | ORAL | Status: DC

## 2016-04-29 MED FILL — TRUVADA 200-300 MG TABS: 200-300 | 30 days supply | Qty: 30 | Fill #0

## 2016-04-29 MED FILL — TIVICAY 50 MG TABLET: 50 | 30 days supply | Qty: 30 | Fill #0

## 2016-04-30 ENCOUNTER — Ambulatory Visit (INDEPENDENT_AMBULATORY_CARE_PROVIDER_SITE_OTHER): Payer: 59 | Admitting: Internal Medicine

## 2016-04-30 ENCOUNTER — Ambulatory Visit: Payer: Self-pay | Admitting: *Deleted

## 2016-04-30 ENCOUNTER — Encounter: Payer: Self-pay | Admitting: Internal Medicine

## 2016-04-30 VITALS — BP 147/72 | HR 70 | Temp 98.0°F | Wt 120.0 lb

## 2016-04-30 DIAGNOSIS — F329 Major depressive disorder, single episode, unspecified: Secondary | ICD-10-CM

## 2016-04-30 DIAGNOSIS — B20 Human immunodeficiency virus [HIV] disease: Secondary | ICD-10-CM | POA: Diagnosis not present

## 2016-04-30 DIAGNOSIS — F32A Depression, unspecified: Secondary | ICD-10-CM

## 2016-04-30 DIAGNOSIS — G479 Sleep disorder, unspecified: Secondary | ICD-10-CM | POA: Diagnosis not present

## 2016-04-30 MED ORDER — CITALOPRAM HYDROBROMIDE 10 MG PO TABS: 10.0000 mg | ORAL_TABLET | Freq: Every day | ORAL | Status: DC

## 2016-04-30 MED ORDER — DOLUTEGRAVIR SODIUM 50 MG PO TABS: 50.0000 mg | ORAL_TABLET | Freq: Every day | ORAL | Status: DC

## 2016-04-30 MED ORDER — EMTRICITABINE-TENOFOVIR AF 200-25 MG PO TABS: 1.0000 | ORAL_TABLET | Freq: Every day | ORAL | Status: DC

## 2016-04-30 MED FILL — CITALOPRAM HBR 10 MG TABLET: 10 | 30 days supply | Qty: 30 | Fill #0

## 2016-04-30 NOTE — Assessment & Plan Note (Signed)
Mainly related to 45 year old and working.

## 2016-04-30 NOTE — BH Specialist Note (Signed)
Counselor met with [name] today in the exam room as a warm handoff per request of Dr. Linus Salmons for observed depression.  Patient ws oriented times four with good affect and dress.  Patient was a bit shy at first but eventually began to open up and share freely.  Patient stated that she has experienced depression since coming to the [place] because of the transition from her country of [place] and the culture shock.  Patient says that not being to find suitable work has been more than disappointing and the financial strain has been too much on her and her relationship with her husband. Patient stated that she had a nervous break down and was in the hospital a couple of months due to mental health issues and not wanting to live. Patient denies any suicidal ideation today.  Patient also shared that her daughter is driving her crazy because she does not take care of her one year old son and [name] has most of the responsibility for him.  Counselor used reflective listening skills as well as provided support and encouragement accordingly.

## 2016-04-30 NOTE — Assessment & Plan Note (Signed)
Doing good.  Has not been on PCP prophylaxis trying to get her to stay on treatment and she is doing well so will not add anything.  rtc 4 months.

## 2016-04-30 NOTE — Assessment & Plan Note (Signed)
She is interested in restarting the Celexa so will do that.

## 2016-04-30 NOTE — Progress Notes (Signed)
   Subjective:    Patient ID: [name], female    DOB: 09/20/1971, 45 y.o.   MRN: [id number]  HPI Here for follow up of HIV.     I changed her to Tivicay with Truvada last year.  CD4 of 180 remains stable, viral load just 28 copies.  She endorses good compliance. Still has a grandchild living with her and her daughter. Working nights in housekeeping.  No complaints.  Does still have depression and felt that she improved on Celexa, bu stopped since she thought it was for a limited time.     Review of Systems  Constitutional: Negative for fatigue and unexpected weight change.  Gastrointestinal: Negative for nausea and diarrhea.  Neurological: Negative for dizziness and light-headedness.       Objective:   Physical Exam  Constitutional: She appears well-developed and well-nourished. No distress.  HENT:  Mouth/Throat: No oropharyngeal exudate.  Eyes: Right eye exhibits no discharge. Left eye exhibits no discharge. No scleral icterus.  Cardiovascular: Normal rate, regular rhythm and normal heart sounds.   No murmur heard. Pulmonary/Chest: Effort normal and breath sounds normal. No respiratory distress. She has no wheezes.  Musculoskeletal: She exhibits no edema.  Lymphadenopathy:    She has no cervical adenopathy.  Skin: No rash noted.    Social History   Social History  . Marital Status: Married    Spouse Name: N/A  . Number of Children: N/A  . Years of Education: N/A   Occupational History  . Not on file.   Social History Main Topics  . Smoking status: Never Smoker   . Smokeless tobacco: Never Used  . Alcohol Use: No  . Drug Use: No  . Sexual Activity:    Partners: Male    Birth Control/ Protection: None     Comment: pt given condoms   Other Topics Concern  . Not on file   Social History Narrative        Assessment & Plan:

## 2016-05-07 ENCOUNTER — Ambulatory Visit: Payer: Self-pay | Admitting: *Deleted

## 2016-07-23 MED FILL — TIVICAY 50 MG TABLET: 50 | 30 days supply | Qty: 30 | Fill #1

## 2016-07-23 MED FILL — DESCOVY 200-25 MG TABS: 200-25 | 30 days supply | Qty: 30 | Fill #0

## 2016-07-23 MED FILL — CITALOPRAM HBR 10 MG TABLET: 10 | 30 days supply | Qty: 30 | Fill #1

## 2016-07-28 ENCOUNTER — Telehealth: Payer: Self-pay | Admitting: *Deleted

## 2016-07-28 NOTE — Telephone Encounter (Signed)
Pharmacy contacted triage to confirm regimen. Patient [facility] up her medication [facility].  Pharmacist concerned about compliance. Will 'cc her provider as well as Lind CovertAmbre McNeil for outreach. Andree CossHowell, Connell Bognar M, RN

## 2016-08-13 ENCOUNTER — Other Ambulatory Visit: Payer: Self-pay

## 2016-08-27 ENCOUNTER — Ambulatory Visit: Payer: Self-pay | Admitting: Internal Medicine

## 2016-09-11 MED FILL — TIVICAY 50 MG TABLET: 50 | 30 days supply | Qty: 30 | Fill #2

## 2016-09-11 MED FILL — DESCOVY 200-25 MG TABS: 200-25 | 30 days supply | Qty: 30 | Fill #1

## 2016-09-14 MED FILL — CITALOPRAM HBR 10 MG TABLET: 10 | 30 days supply | Qty: 30 | Fill #2

## 2016-10-06 ENCOUNTER — Ambulatory Visit: Payer: Self-pay | Admitting: Internal Medicine

## 2016-11-03 ENCOUNTER — Encounter: Payer: Self-pay | Admitting: Internal Medicine

## 2016-11-03 ENCOUNTER — Ambulatory Visit (INDEPENDENT_AMBULATORY_CARE_PROVIDER_SITE_OTHER): Payer: 59 | Admitting: Internal Medicine

## 2016-11-03 VITALS — BP 114/51 | HR 69 | Temp 98.0°F | Wt 131.0 lb

## 2016-11-03 DIAGNOSIS — B20 Human immunodeficiency virus [HIV] disease: Secondary | ICD-10-CM

## 2016-11-03 DIAGNOSIS — F321 Major depressive disorder, single episode, moderate: Secondary | ICD-10-CM

## 2016-11-03 DIAGNOSIS — Z23 Encounter for immunization: Secondary | ICD-10-CM | POA: Diagnosis not present

## 2016-11-03 DIAGNOSIS — R630 Anorexia: Secondary | ICD-10-CM | POA: Diagnosis not present

## 2016-11-03 NOTE — Progress Notes (Signed)
   Subjective:    Patient ID: [name], female    DOB: 09/21/1971, 46 y.o.   MRN: [id number]  HPI Here for follow up of HIV.     She continues on Tivicay and Descovy and she reports no missed doses.  The pharmacy though has reported that she does not fill her medications monthly.  Last CD4 of 180 remains stable, viral load just 28 copies done in May 2017.  She endorses good compliance despite this. Still has a grandchild living with her and her daughter.  Not currently working and wanting to get disability.  Having trouble concentrating.   Not taking Celexa anymore.     Review of Systems  Constitutional: Negative for fatigue and unexpected weight change.  Gastrointestinal: Negative for diarrhea and nausea.  Neurological: Negative for dizziness and light-headedness.       Objective:   Physical Exam  Constitutional: She appears well-developed and well-nourished. No distress.  HENT:  Mouth/Throat: No oropharyngeal exudate.  Eyes: Right eye exhibits no discharge. Left eye exhibits no discharge. No scleral icterus.  Cardiovascular: Normal rate, regular rhythm and normal heart sounds.   No murmur heard. Pulmonary/Chest: Effort normal and breath sounds normal. No respiratory distress. She has no wheezes.  Musculoskeletal: She exhibits no edema.  Lymphadenopathy:    She has no cervical adenopathy.  Skin: No rash noted.    SHx: no alcohol      Assessment & Plan:

## 2016-11-03 NOTE — Assessment & Plan Note (Signed)
Resolved, eating well now.

## 2016-11-03 NOTE — Assessment & Plan Note (Signed)
Will check labs today and if no concerns can rtc 4 months.

## 2016-11-03 NOTE — Assessment & Plan Note (Signed)
Not interested at this time in Celexa.

## 2016-11-04 LAB — T-HELPER CELL (CD4) - (RCID CLINIC ONLY)
CD4 % Helper T Cell: 15 % — ABNORMAL LOW (ref 33–55)
CD4 T CELL ABS: 270 /uL — AB (ref 400–2700)

## 2016-11-06 LAB — HIV-1 RNA QUANT-NO REFLEX-BLD: HIV 1 RNA Quant: <20 copies/mL (ref <20)

## 2016-11-11 MED FILL — TIVICAY 50 MG TABLET: 50 | 30 days supply | Qty: 30 | Fill #3

## 2016-11-11 MED FILL — DESCOVY 200-25 MG TABS: 200-25 | 30 days supply | Qty: 30 | Fill #2

## 2016-11-11 MED FILL — CITALOPRAM HBR 10 MG TABLET: 10 | 30 days supply | Qty: 30 | Fill #3

## 2016-12-11 MED FILL — CITALOPRAM HBR 10 MG TABLET: 10 | 30 days supply | Qty: 30 | Fill #4

## 2016-12-11 MED FILL — DESCOVY 200-25 MG TABS: 200-25 | 30 days supply | Qty: 30 | Fill #3

## 2016-12-11 MED FILL — TIVICAY 50 MG TABLET: 50 | 30 days supply | Qty: 30 | Fill #0

## 2017-01-13 MED FILL — TIVICAY 50 MG TABLET: 50 | 30 days supply | Qty: 30 | Fill #1

## 2017-01-13 MED FILL — DESCOVY 200-25 MG TABS: 200-25 | 30 days supply | Qty: 30 | Fill #4

## 2017-01-13 MED FILL — CITALOPRAM HBR 10 MG TABLET: 10 | 30 days supply | Qty: 30 | Fill #5

## 2017-02-11 MED FILL — CITALOPRAM HBR 10 MG TABLET: 10 | 30 days supply | Qty: 30 | Fill #6

## 2017-02-11 MED FILL — DESCOVY 200-25 MG TABS: 200-25 | 30 days supply | Qty: 30 | Fill #5

## 2017-02-17 ENCOUNTER — Other Ambulatory Visit: Payer: Self-pay

## 2017-02-24 ENCOUNTER — Other Ambulatory Visit (HOSPITAL_COMMUNITY)
Admission: RE | Admit: 2017-02-24 | Discharge: 2017-02-24 | Disposition: A | Discharge status: Home / Self Care | Payer: 59 | Source: Ambulatory Visit | Attending: Internal Medicine | Admitting: Internal Medicine

## 2017-02-24 ENCOUNTER — Other Ambulatory Visit: Payer: 59

## 2017-02-24 DIAGNOSIS — Z79899 Other long term (current) drug therapy: Secondary | ICD-10-CM

## 2017-02-24 DIAGNOSIS — Z113 Encounter for screening for infections with a predominantly sexual mode of transmission: Secondary | ICD-10-CM | POA: Insufficient documentation

## 2017-02-24 DIAGNOSIS — B20 Human immunodeficiency virus [HIV] disease: Secondary | ICD-10-CM

## 2017-02-24 LAB — COMPREHENSIVE METABOLIC PANEL
ALBUMIN: 4.2 g/dL (ref 3.6–5.1)
ALT: 16 U/L (ref 6–29)
AST: 19 U/L (ref 10–30)
Alkaline Phosphatase: 79 U/L (ref 33–115)
BUN: 15 mg/dL (ref 7–25)
CALCIUM: 9.7 mg/dL (ref 8.6–10.2)
CHLORIDE: 105 mmol/L (ref 98–110)
CO2: 22 mmol/L (ref 20–31)
Creat: 1.01 mg/dL (ref 0.50–1.10)
Glucose, Bld: 82 mg/dL (ref 65–99)
POTASSIUM: 3.7 mmol/L (ref 3.5–5.3)
Sodium: 140 mmol/L (ref 135–146)
TOTAL PROTEIN: 8 g/dL (ref 6.1–8.1)
Total Bilirubin: 0.4 mg/dL (ref 0.2–1.2)

## 2017-02-24 LAB — CBC WITH DIFFERENTIAL/PLATELET
Basophils Absolute: 0 cells/uL (ref 0–200)
Basophils Relative: 0 %
EOS ABS: 228 {cells}/uL (ref 15–500)
Eosinophils Relative: 6 %
HCT: 40 % (ref 35.0–45.0)
Hemoglobin: 13.4 g/dL (ref 11.7–15.5)
LYMPHS PCT: 44 %
Lymphs Abs: 1672 cells/uL (ref 850–3900)
MCH: 30.8 pg (ref 27.0–33.0)
MCHC: 33.5 g/dL (ref 32.0–36.0)
MCV: 92 fL (ref 80.0–100.0)
MPV: 10.2 fL (ref 7.5–12.5)
Monocytes Absolute: 304 cells/uL (ref 200–950)
Monocytes Relative: 8 %
NEUTROS PCT: 42 %
Neutro Abs: 1596 cells/uL (ref 1500–7800)
PLATELETS: 238 10*3/uL (ref 140–400)
RBC: 4.35 MIL/uL (ref 3.80–5.10)
RDW: 13.8 % (ref 11.0–15.0)
WBC: 3.8 10*3/uL (ref 3.8–10.8)

## 2017-02-24 LAB — LIPID PANEL
Cholesterol: 248 mg/dL — ABNORMAL HIGH (ref <200)
HDL: 76 mg/dL (ref >50)
LDL CALC: 151 mg/dL — AB (ref <100)
Total CHOL/HDL Ratio: 3.3 Ratio (ref <5.0)
Triglycerides: 103 mg/dL (ref <150)
VLDL: 21 mg/dL (ref <30)

## 2017-02-25 LAB — URINE CYTOLOGY ANCILLARY ONLY
Chlamydia: NEGATIVE
NEISSERIA GONORRHEA: NEGATIVE

## 2017-02-25 LAB — T-HELPER CELL (CD4) - (RCID CLINIC ONLY)
CD4 % Helper T Cell: 13 % — ABNORMAL LOW (ref 33–55)
CD4 T Cell Abs: 240 /uL — ABNORMAL LOW (ref 400–2700)

## 2017-02-25 LAB — RPR

## 2017-02-26 LAB — HIV-1 RNA QUANT-NO REFLEX-BLD
HIV 1 RNA QUANT: 25 {copies}/mL — AB
HIV-1 RNA QUANT, LOG: 1.4 {Log_copies}/mL — AB

## 2017-03-03 ENCOUNTER — Encounter: Payer: Self-pay | Admitting: Internal Medicine

## 2017-03-03 ENCOUNTER — Ambulatory Visit (INDEPENDENT_AMBULATORY_CARE_PROVIDER_SITE_OTHER): Payer: 59 | Admitting: Internal Medicine

## 2017-03-03 VITALS — BP 110/76 | HR 82 | Temp 98.5°F | Wt 140.0 lb

## 2017-03-03 DIAGNOSIS — Z1231 Encounter for screening mammogram for malignant neoplasm of breast: Secondary | ICD-10-CM | POA: Diagnosis not present

## 2017-03-03 DIAGNOSIS — E785 Hyperlipidemia, unspecified: Secondary | ICD-10-CM | POA: Diagnosis not present

## 2017-03-03 DIAGNOSIS — R42 Dizziness and giddiness: Secondary | ICD-10-CM | POA: Diagnosis not present

## 2017-03-03 DIAGNOSIS — Z113 Encounter for screening for infections with a predominantly sexual mode of transmission: Secondary | ICD-10-CM | POA: Diagnosis not present

## 2017-03-03 DIAGNOSIS — B20 Human immunodeficiency virus [HIV] disease: Secondary | ICD-10-CM | POA: Diagnosis not present

## 2017-03-03 DIAGNOSIS — Z0000 Encounter for general adult medical examination without abnormal findings: Secondary | ICD-10-CM | POA: Diagnosis not present

## 2017-03-03 DIAGNOSIS — F329 Major depressive disorder, single episode, unspecified: Secondary | ICD-10-CM | POA: Diagnosis not present

## 2017-03-03 DIAGNOSIS — Z87898 Personal history of other specified conditions: Secondary | ICD-10-CM | POA: Diagnosis not present

## 2017-03-03 DIAGNOSIS — K219 Gastro-esophageal reflux disease without esophagitis: Secondary | ICD-10-CM | POA: Diagnosis not present

## 2017-03-03 DIAGNOSIS — R635 Abnormal weight gain: Secondary | ICD-10-CM | POA: Diagnosis not present

## 2017-03-03 DIAGNOSIS — Z21 Asymptomatic human immunodeficiency virus [HIV] infection status: Secondary | ICD-10-CM | POA: Diagnosis not present

## 2017-03-03 DIAGNOSIS — E119 Type 2 diabetes mellitus without complications: Secondary | ICD-10-CM | POA: Diagnosis not present

## 2017-03-03 DIAGNOSIS — M19049 Primary osteoarthritis, unspecified hand: Secondary | ICD-10-CM | POA: Diagnosis not present

## 2017-03-03 NOTE — Assessment & Plan Note (Signed)
Screened negative 

## 2017-03-03 NOTE — Assessment & Plan Note (Signed)
I encouraged her to take the Celexa daily.

## 2017-03-03 NOTE — Assessment & Plan Note (Signed)
No concerning signs.  Does not drink much water.  I encouraged her to drink more.

## 2017-03-03 NOTE — Progress Notes (Signed)
   Subjective:    Patient ID: [name], female    DOB: 1971-02-19, 46 y.o.   MRN: [id number]  HPI Here for follow up of HIV and depression.   Has been on tivicay and Descovy and denies any missed doses. No issues with medication.  Still feels depressed sometimes but only takes Celexa when she is feeling depressed.  Some new dizziness/lightheadedness.  No associated n/v/d.  CD4 240 and viral load just 25 copies.     Review of Systems  Constitutional: Negative for fatigue.  Gastrointestinal: Negative for nausea.  Skin: Negative for rash.  Neurological: Positive for light-headedness. Negative for headaches.       Objective:   Physical Exam  Constitutional: She appears well-developed and well-nourished. No distress.  Eyes: No scleral icterus.  Cardiovascular: Normal rate, regular rhythm and normal heart sounds.   No murmur heard. Pulmonary/Chest: Effort normal and breath sounds normal. No respiratory distress.  Skin: No rash noted.   SH: no alcohol       Assessment & Plan:

## 2017-03-10 MED FILL — CITALOPRAM HBR 10 MG TABLET: 10 | 30 days supply | Qty: 30 | Fill #7

## 2017-03-10 MED FILL — DESCOVY 200-25 MG TABS: 200-25 | 30 days supply | Qty: 30 | Fill #6

## 2017-03-10 MED FILL — TIVICAY 50 MG TABLET: 50 | 30 days supply | Qty: 30 | Fill #2

## 2017-04-12 MED FILL — CITALOPRAM HBR 10 MG TABLET: 10 | 30 days supply | Qty: 30 | Fill #8

## 2017-04-12 MED FILL — DESCOVY 200-25 MG TABS: 200-25 | 30 days supply | Qty: 30 | Fill #7

## 2017-04-12 MED FILL — TIVICAY 50 MG TABLET: 50 | 30 days supply | Qty: 30 | Fill #3

## 2017-05-11 ENCOUNTER — Other Ambulatory Visit: Payer: Self-pay | Admitting: Internal Medicine

## 2017-05-11 DIAGNOSIS — B20 Human immunodeficiency virus [HIV] disease: Secondary | ICD-10-CM

## 2017-05-26 MED FILL — TIVICAY 50 MG TABLET: 50 | 30 days supply | Qty: 30 | Fill #0

## 2017-05-26 MED FILL — DESCOVY 200-25 MG TABS: 200-25 | 30 days supply | Qty: 30 | Fill #0

## 2017-06-17 MED FILL — DESCOVY 200-25 MG TABS: 200-25 | 30 days supply | Qty: 30 | Fill #1

## 2017-06-17 MED FILL — TIVICAY 50 MG TABLET: 50 | 30 days supply | Qty: 30 | Fill #1

## 2017-06-22 ENCOUNTER — Other Ambulatory Visit: Payer: 59

## 2017-06-22 DIAGNOSIS — B20 Human immunodeficiency virus [HIV] disease: Secondary | ICD-10-CM

## 2017-06-23 LAB — T-HELPER CELL (CD4) - (RCID CLINIC ONLY)
CD4 % Helper T Cell: 15 % — ABNORMAL LOW (ref 33–55)
CD4 T CELL ABS: 280 /uL — AB (ref 400–2700)

## 2017-06-25 LAB — HIV-1 RNA QUANT-NO REFLEX-BLD
HIV 1 RNA Quant: 41 copies/mL — ABNORMAL HIGH
HIV-1 RNA QUANT, LOG: 1.61 {Log_copies}/mL — AB

## 2017-07-06 ENCOUNTER — Ambulatory Visit: Payer: Self-pay | Admitting: Internal Medicine

## 2017-07-09 MED FILL — TIVICAY 50 MG TABLET: 50 | 30 days supply | Qty: 30 | Fill #2

## 2017-07-09 MED FILL — DESCOVY 200-25 MG TABS: 200-25 | 30 days supply | Qty: 30 | Fill #2

## 2017-07-19 ENCOUNTER — Ambulatory Visit: Payer: Self-pay | Admitting: Infectious Diseases

## 2017-07-27 ENCOUNTER — Ambulatory Visit (INDEPENDENT_AMBULATORY_CARE_PROVIDER_SITE_OTHER): Payer: 59 | Admitting: Infectious Diseases

## 2017-07-27 ENCOUNTER — Other Ambulatory Visit (HOSPITAL_COMMUNITY)
Admission: RE | Admit: 2017-07-27 | Discharge: 2017-07-27 | Disposition: A | Discharge status: Home / Self Care | Payer: 59 | Source: Ambulatory Visit | Attending: Infectious Diseases | Admitting: Infectious Diseases

## 2017-07-27 DIAGNOSIS — M25532 Pain in left wrist: Secondary | ICD-10-CM | POA: Diagnosis not present

## 2017-07-27 DIAGNOSIS — Z124 Encounter for screening for malignant neoplasm of cervix: Secondary | ICD-10-CM | POA: Diagnosis not present

## 2017-07-27 NOTE — Progress Notes (Signed)
     Subjective:    [name] is a 46 y.o. female here for an annual pelvic exam and pap smear.    Current GYN complaints or concerns: irregular menses and odor with discharge.   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 Reports aching pain that is intermittent and exacerbated by any activity. Has tried splinting it for support but nothing else. No trauma that she can specifically recall that caused pain.   Past Medical History:  Diagnosis Date  . Abnormal Pap smear   . Back pain   . Depression   . HIV disease [facility])     Gynecologic History No LMP recorded. Contraception: condoms Last Pap: 10/2015. Results were: normal Last Mammogram: not indicated   Review of Systems Patient denies any abdominal/pelvic pain, problems with bowel movements, urination, or pain with or intercourse. Does report some odor and white discharge.   Objective:  There were no vitals taken for this visit. Physical Exam  Constitutional: Well developed, well nourished, no acute distress. She is alert and oriented x3.  Pelvic: External genitalia is normal in appearance. The vagina is normal in appearance, however there is a bulge of tissue from 2-3 o'clock position that disrupts visual field when speculum is opened. Cervix was not well visualized. No CMT, normal expected cervical mucus present. No odor.  Psych: She has a normal mood and affect.   Musculoskeletal: Negative phalen's sign, negative Tinel's sign. No visual deformity. Tenderness over the left radial tendon. No epicondylar tenderness bilaterally. Normal ROM.   Assessment:  Normal pelvic exam. Thin prep pap was obtained and sent for cytology, HPV and GC/C today.   Plan:  Health Maintenance = Return in 1 year for annual pap screening unless indicated sooner. No concern with exam over BV but will request testing on sample.   Pelvic Exam = no complaints for urinary incontinence or pain  with intercourse. Seems she may have a cystocele. Should this become a problem would refer to GYN or Urology services.   HIV = F/U as scheduled with Dr. Luciana Axe for ongoing HIV care.   Wrist Pain = overuse injury vs tendonitis. I have advised her to continue to splint and support during her work and make sure she ices the area 15 minutes at a time several times a day. Ibuprofen as needed for inflammation / pain.   Rexene Alberts, MSN, NP-C [facility] 07/27/2017 3:52 PM

## 2017-07-30 LAB — CYTOLOGY - PAP
Adequacy: ABSENT
Bacterial vaginitis: POSITIVE
Candida vaginitis: NEGATIVE
Chlamydia: NEGATIVE
Diagnosis: NEGATIVE
NEISSERIA GONORRHEA: NEGATIVE

## 2017-08-02 ENCOUNTER — Telehealth: Payer: Self-pay | Admitting: *Deleted

## 2017-08-02 ENCOUNTER — Other Ambulatory Visit: Payer: Self-pay | Admitting: *Deleted

## 2017-08-02 MED ORDER — METRONIDAZOLE 500 MG PO TABS: 500.0000 mg | ORAL_TABLET | Freq: Two times a day (BID) | ORAL | 0 refills | Status: DC

## 2017-08-02 MED FILL — DESCOVY 200-25 MG TABS: 200-25 | 30 days supply | Qty: 30 | Fill #3

## 2017-08-02 MED FILL — metroNIDAZOLE 500 MG TABS: 500 | 7 days supply | Qty: 14 | Fill #0

## 2017-08-02 MED FILL — TIVICAY 50 MG TABLET: 50 | 30 days supply | Qty: 30 | Fill #3

## 2017-08-02 NOTE — Telephone Encounter (Signed)
Patient notified and Rx sent to [facility]. Wendall Mola

## 2017-08-02 NOTE — Telephone Encounter (Signed)
-----   Message from Blanchard Kelch, NP sent at 07/30/2017  5:39 PM EDT ----- Please call patient and let her know that she has bacterial vaginosis which explains the discharge and odor she spoke of. Please send in rx for Flagyl 500 mg BID x 7 days with instructions to abstain from sex for 10 days while being treated to avoid re-infection and avoid all alcohol x 2 weeks.   I didn't want to send in rx on Friday and have pharmacy call for pick up without notifying her first. Thank you!

## 2017-08-02 NOTE — Telephone Encounter (Signed)
Thank you Jackie

## 2017-08-04 ENCOUNTER — Other Ambulatory Visit: Payer: Self-pay | Admitting: Pharmacist

## 2017-08-04 MED ORDER — METRONIDAZOLE 500 MG PO TABS: 500.0000 mg | ORAL_TABLET | Freq: Two times a day (BID) | ORAL | 0 refills | Status: DC

## 2017-08-04 MED FILL — DESCOVY 200-25 MG TABS: 200-25 | 30 days supply | Qty: 30 | Fill #4

## 2017-08-04 MED FILL — metroNIDAZOLE 500 MG TABS: 500 | 7 days supply | Qty: 14 | Fill #0

## 2017-08-04 MED FILL — TIVICAY 50 MG TABLET: 50 | 30 days supply | Qty: 30 | Fill #4

## 2017-08-17 ENCOUNTER — Encounter: Payer: Self-pay | Admitting: Infectious Diseases

## 2017-08-17 ENCOUNTER — Ambulatory Visit (INDEPENDENT_AMBULATORY_CARE_PROVIDER_SITE_OTHER): Payer: 59 | Admitting: Infectious Diseases

## 2017-08-17 ENCOUNTER — Ambulatory Visit: Payer: Self-pay | Admitting: Internal Medicine

## 2017-08-17 VITALS — BP 132/87 | HR 65 | Temp 98.6°F | Wt 144.0 lb

## 2017-08-17 DIAGNOSIS — F5102 Adjustment insomnia: Secondary | ICD-10-CM | POA: Diagnosis not present

## 2017-08-17 DIAGNOSIS — G479 Sleep disorder, unspecified: Secondary | ICD-10-CM | POA: Diagnosis not present

## 2017-08-17 DIAGNOSIS — F329 Major depressive disorder, single episode, unspecified: Secondary | ICD-10-CM | POA: Diagnosis not present

## 2017-08-17 DIAGNOSIS — Z21 Asymptomatic human immunodeficiency virus [HIV] infection status: Secondary | ICD-10-CM

## 2017-08-17 DIAGNOSIS — B20 Human immunodeficiency virus [HIV] disease: Secondary | ICD-10-CM | POA: Diagnosis not present

## 2017-08-17 MED ORDER — TRAZODONE HCL 50 MG PO TABS: 50.0000 mg | ORAL_TABLET | Freq: Every day | ORAL | 3 refills | Status: DC

## 2017-08-17 NOTE — Patient Instructions (Addendum)
Start taking trazodone once a night to help you sleep.   Will check your labs today.  Please come back in 1 month to see how you are doing with the new medicine.   Cordelia Pen will call you for an appointment.   Insomnia Insomnia is a sleep disorder that makes it difficult to fall asleep or to stay asleep. Insomnia can cause tiredness (fatigue), low energy, difficulty concentrating, mood swings, and poor performance at work or school. There are three different ways to classify insomnia:  Difficulty falling asleep.  Difficulty staying asleep.  Waking up too early in the morning.  Any type of insomnia can be long-term (chronic) or short-term (acute). Both are common. Short-term insomnia usually lasts for three months or less. Chronic insomnia occurs at least three times a week for longer than three months. What are the causes? Insomnia may be caused by another condition, situation, or substance, such as:  Anxiety.  Certain medicines.  Gastroesophageal reflux disease (GERD) or other gastrointestinal conditions.  Asthma or other breathing conditions.  Restless legs syndrome, sleep apnea, or other sleep disorders.  Chronic pain.  Menopause. This may include hot flashes.  Stroke.  Abuse of alcohol, tobacco, or illegal drugs.  Depression.  Caffeine.  Neurological disorders, such as Alzheimer disease.  An overactive thyroid (hyperthyroidism).  The cause of insomnia may not be known. What increases the risk? Risk factors for insomnia include:  Gender. Women are more commonly affected than men.  Age. Insomnia is more common as you get older.  Stress. This may involve your professional or personal life.  Income. Insomnia is more common in people with lower income.  Lack of exercise.  Irregular work schedule or night shifts.  Traveling between different time zones.  What are the signs or symptoms? If you have insomnia, trouble falling asleep or trouble staying asleep  is the main symptom. This may lead to other symptoms, such as:  Feeling fatigued.  Feeling nervous about going to sleep.  Not feeling rested in the morning.  Having trouble concentrating.  Feeling irritable, anxious, or depressed.  How is this treated? Treatment for insomnia depends on the cause. If your insomnia is caused by an underlying condition, treatment will focus on addressing the condition. Treatment may also include:  Medicines to help you sleep.  Counseling or therapy.  Lifestyle adjustments.  Follow these instructions at home:  Take medicines only as directed by your health care provider.  Keep regular sleeping and waking hours. Avoid naps.  Keep a sleep diary to help you and your health care provider figure out what could be causing your insomnia. Include: ? When you sleep. ? When you wake up during the night. ? How well you sleep. ? How rested you feel the next day. ? Any side effects of medicines you are taking. ? What you eat and drink.  Make your bedroom a comfortable place where it is easy to fall asleep: ? Put up shades or special blackout curtains to block light from outside. ? Use a white noise machine to block noise. ? Keep the temperature cool.  Exercise regularly as directed by your health care provider. Avoid exercising right before bedtime.  Use relaxation techniques to manage stress. Ask your health care provider to suggest some techniques that may work well for you. These may include: ? Breathing exercises. ? Routines to release muscle tension. ? Visualizing peaceful scenes.  Cut back on alcohol, caffeinated beverages, and cigarettes, especially close to bedtime. These can disrupt  your sleep.  Do not overeat or eat spicy foods right before bedtime. This can lead to digestive discomfort that can make it hard for you to sleep.  Limit screen use before bedtime. This includes: ? Watching TV. ? Using your smartphone, tablet, and  computer.  Stick to a routine. This can help you fall asleep faster. Try to do a quiet activity, brush your teeth, and go to bed at the same time each night.  Get out of bed if you are still awake after 15 minutes of trying to sleep. Keep the lights down, but try reading or doing a quiet activity. When you feel sleepy, go back to bed.  Make sure that you drive carefully. Avoid driving if you feel very sleepy.  Keep all follow-up appointments as directed by your health care provider. This is important. Contact a health care provider if:  You are tired throughout the day or have trouble in your daily routine due to sleepiness.  You continue to have sleep problems or your sleep problems get worse. Get help right away if:  You have serious thoughts about hurting yourself or someone else. This information is not intended to replace advice given to you by your health care provider. Make sure you discuss any questions you have with your health care provider. Document Released: 10/16/2000 Document Revised: 03/20/2016 Document Reviewed: 07/20/2014 Elsevier Interactive Patient Education  [employer].

## 2017-08-17 NOTE — Assessment & Plan Note (Signed)
Will have her make an appointment with Cordelia Pen (busy with another patient today). She is pretty resistant for medications but readily admits that she is struggling. Did not like citalopram as her depression worsened. Will stop this formally and see if Trazodone helps at all. May benefit from alternative medication. Will have her follow up in 1 month to see if any improvement.

## 2017-08-17 NOTE — Progress Notes (Signed)
Brief Narrative: [name] is a patient of Dr. Ephriam Knuckles here today for routine care of her HIV infection.   PCP - Shamleffer, Landry Mellow, MD    Patient Active Problem List   Diagnosis Date Noted  . Dizziness 03/03/2017  . Screening examination for venereal disease 09/12/2015  . Encounter for long-term (current) use of medications 09/12/2015  . 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 Seizure (HCC) 02/04/2014  . Poor appetite 10/03/2013  . Dysmenorrhea 12/29/2012  . Constipation 11/22/2012  . GERD (gastroesophageal reflux disease) 04/16/2012  . LSIL (low grade squamous intraepithelial lesion) on Pap smear 04/07/2012  . Depressed 04/01/2012  . Abdominal bloating 04/01/2012  . Sleep difficulties 11/26/2011  . PARESTHESIA 10/21/2009  . HEADACHE 10/21/2009  . HIV (human immunodeficiency virus infection) (HCC) 10/07/2009  . ALLERGIC RHINITIS, SEASONAL, MILD 03/08/2009    Patient's Medications  New Prescriptions   TRAZODONE (DESYREL) 50 MG TABLET    Take 1 tablet (50 mg total) by mouth at bedtime.  Previous Medications   DESCOVY 200-25 MG TABLET    TAKE 1 TABLET BY MOUTH ONCE DAILY   TIVICAY 50 MG TABLET    TAKE 1 TABLET BY MOUTH ONCE DAILY  Modified Medications   No medications on file  Discontinued Medications   CITALOPRAM (CELEXA) 10 MG TABLET    TAKE 1 TABLET BY MOUTH ONCE DAILY   METRONIDAZOLE (FLAGYL) 500 MG TABLET    Take 1 tablet (500 mg total) by mouth 2 (two) times daily.    Subjective: [name] presents to clinic today for routine follow up care for her HIV infection.  HPI:  HIV =  Currently on a regimen of TIVICAY and DESCOVY. She is tolerating the regimen well and estimates no missed doses over the last 30 days. Carries it with her daily in her purse to help with adherence.   Bacterial Vaginosis = Concerned if the bacteria is gone in her vagina - completed the course of Flagyl and abstained from sex during treatment. Reports improvement in her  vaginal secretions and they have essentially gone away. No longer itching.   Insomnia = not sleeping well. Reports a lot of stress at home that is provoked from her husband. Works evening shift and not sleeping well. Asking for the medication Dr. Luciana Axe gave her a few years ago.   Depression = not taking citalopram as she does not like it. Reports she has felt depressed since HIV diagnosis and was told she was pre-diabetic. Feels that if she improves her sleep this will help the depression.   Review of Systems: Review of Systems  Constitutional: Negative for chills, fever, malaise/fatigue and weight loss.  HENT: Negative for sore throat.        No dental problems  Respiratory: Negative for cough and sputum production.   Cardiovascular: Negative for chest pain and leg swelling.  Gastrointestinal: Negative for abdominal pain, diarrhea and vomiting.  Genitourinary: Negative for dysuria and flank pain.  Musculoskeletal: Negative for joint pain, myalgias and neck pain.  Skin: Negative for rash.  Neurological: Negative for dizziness, tingling and headaches.  Psychiatric/Behavioral: Positive for depression. Negative for substance abuse. The patient is nervous/anxious and has insomnia.     Past Medical History:  Diagnosis Date  . Abnormal Pap smear   . Back pain   . Depression   . HIV disease (HCC)     No Known Allergies  Objective:  Vitals:   08/17/17 1511  BP: 132/87  Pulse: 65  Temp: 98.6 F (37 C)  TempSrc: Oral  Weight: 144 lb (65.3 kg)   Body mass index is 26.34 kg/m.  Physical Exam  Constitutional: She is well-developed, well-nourished, and in no distress.  Pleasant and seated comfortably in chair today.   HENT:  Mouth/Throat: Oropharynx is clear and moist.  Cardiovascular: Normal rate, regular rhythm and normal heart sounds.   Pulmonary/Chest: Effort normal and breath sounds normal. No respiratory distress.  Abdominal: Soft. Bowel sounds are normal.  Skin: Skin is  warm and dry.  Psychiatric: Affect and judgment normal. Her mood appears anxious. She exhibits a depressed mood. She expresses no suicidal ideation.    Lab Results  HIV 1 RNA Quant (copies/mL)  Date Value  06/22/2017 41 (H)  02/24/2017 25 (H)  11/03/2016 <20   CD4 T Cell Abs (/uL)  Date Value  06/22/2017 280 (L)  02/24/2017 240 (L)  11/03/2016 270 (L)      Problem List Items Addressed This Visit      Other   RESOLVED: Adjustment insomnia   Relevant Medications   traZODone (DESYREL) 50 MG tablet   Depressed    Will have her make an appointment with Cordelia Pen (busy with another patient today). She is pretty resistant for medications but readily admits that she is struggling. Did not like citalopram as her depression worsened. Will stop this formally and see if Trazodone helps at all. May benefit from alternative medication. Will have her follow up in 1 month to see if any improvement.       Relevant Medications   traZODone (DESYREL) 50 MG tablet   HIV (human immunodeficiency virus infection) (HCC) - Primary    Will check VL and CD4 today as there is some documentation of adherence issues recently. Continue Tivicay and Descovy.       Relevant Orders   HIV 1 RNA quant-no reflex-bld   T-helper cell (CD4)- ([facility] clinic only)   Sleep difficulties    Will resume her Trazodone 50 mg QHS. Encouraged good sleep hygiene and stress reduction.        RTC in 1 month to assess new mediation regimen.   Rexene Alberts, MSN, NP-C [facility] Pager: [phone]  08/17/17 4:19 PM

## 2017-08-17 NOTE — Assessment & Plan Note (Addendum)
Will resume her Trazodone 50 mg QHS. Encouraged good sleep hygiene and stress reduction.

## 2017-08-17 NOTE — Assessment & Plan Note (Signed)
Will check VL and CD4 today as there is some documentation of adherence issues recently. Continue Tivicay and Descovy.

## 2017-08-19 LAB — T-HELPER CELL (CD4) - (RCID CLINIC ONLY)
CD4 T CELL ABS: 290 /uL — AB (ref 400–2700)
CD4 T CELL HELPER: 14 % — AB (ref 33–55)

## 2017-08-20 LAB — HIV-1 RNA QUANT-NO REFLEX-BLD
HIV 1 RNA Quant: 31 copies/mL — ABNORMAL HIGH
HIV-1 RNA QUANT, LOG: 1.49 {Log_copies}/mL — AB

## 2017-08-23 ENCOUNTER — Telehealth: Payer: Self-pay | Admitting: Licensed Clinical Social Worker

## 2017-08-23 NOTE — Telephone Encounter (Signed)
[facility] attempted to call interpreter to have them translate voice message, however the interpreter stated that the language was not available.  Anita AlbertsSherry Mikiya Bullock, [facility]

## 2017-08-23 NOTE — Telephone Encounter (Signed)
[facility] to introduce self and request return call to arrange for appointment.  [facility] left message in English then called back using the Interpreter to leave the message.  Vergia AlbertsSherry Dayshia Ballinas, [facility]

## 2017-08-30 ENCOUNTER — Other Ambulatory Visit: Payer: Self-pay | Admitting: Infectious Diseases

## 2017-08-30 ENCOUNTER — Other Ambulatory Visit: Payer: Self-pay | Admitting: Pharmacist

## 2017-08-30 ENCOUNTER — Other Ambulatory Visit: Payer: Self-pay | Admitting: Internal Medicine

## 2017-08-30 DIAGNOSIS — B20 Human immunodeficiency virus [HIV] disease: Secondary | ICD-10-CM

## 2017-08-30 MED FILL — DESCOVY 200-25 MG TABS: 200-25 | 30 days supply | Qty: 30 | Fill #0

## 2017-08-30 MED FILL — TIVICAY 50 MG TABLET: 50 | 30 days supply | Qty: 30 | Fill #0

## 2017-09-21 ENCOUNTER — Ambulatory Visit: Payer: Self-pay | Admitting: Internal Medicine

## 2017-09-28 MED FILL — DESCOVY 200-25 MG TABS: 200-25 | 30 days supply | Qty: 30 | Fill #1

## 2017-09-28 MED FILL — TIVICAY 50 MG TABLET: 50 | 30 days supply | Qty: 30 | Fill #1

## 2017-10-01 ENCOUNTER — Telehealth: Payer: Self-pay | Admitting: Pharmacist Clinician (PhC)/ Clinical Pharmacy Specialist

## 2017-10-01 NOTE — Telephone Encounter (Signed)
She missed the last appt with Dr. Luciana Axeomer. Left her a VM to call back for an appt.

## 2017-10-05 ENCOUNTER — Telehealth: Payer: Self-pay | Admitting: Pharmacist Clinician (PhC)/ Clinical Pharmacy Specialist

## 2017-10-05 NOTE — Telephone Encounter (Signed)
[name] missed the last appt with Dr. Luciana Axeomer. Apparently, she is working now. She is off tomorrow and Dr. Luciana Axeomer has an open slot. She will likely need to meet with sherry also for depression counseling.

## 2017-10-06 ENCOUNTER — Ambulatory Visit: Payer: Self-pay | Admitting: Internal Medicine

## 2017-10-06 ENCOUNTER — Ambulatory Visit (INDEPENDENT_AMBULATORY_CARE_PROVIDER_SITE_OTHER): Payer: 59 | Admitting: Pharmacist Clinician (PhC)/ Clinical Pharmacy Specialist

## 2017-10-06 DIAGNOSIS — B20 Human immunodeficiency virus [HIV] disease: Secondary | ICD-10-CM | POA: Diagnosis not present

## 2017-10-06 NOTE — Progress Notes (Signed)
HPI: [name] is a 46 y.o. female who was supposed to be here to see Dr. Luciana Axeomer for an office visit but she was late for the appt.   Allergies: No Known Allergies  Vitals:    Past Medical History: Past Medical History:  Diagnosis Date  . Abnormal Pap smear   . Back pain   . Depression   . HIV disease (HCC)     Social History: Social History   Socioeconomic History  . Marital status: Married    Spouse name: Not on file  . Number of children: Not on file  . Years of education: Not on file  . Highest education level: Not on file  Social Needs  . Financial resource strain: Not on file  . Food insecurity - worry: Not on file  . Food insecurity - inability: Not on file  . Transportation needs - medical: Not on file  . Transportation needs - non-medical: Not on file  Occupational History  . Not on file  Tobacco Use  . Smoking status: Never Smoker  . Smokeless tobacco: Never Used  Substance and Sexual Activity  . Alcohol use: No  . Drug use: No  . Sexual activity: Yes    Partners: Male    Birth control/protection: None    Comment: pt given condoms  Other Topics Concern  . Not on file  Social History Narrative  . Not on file    Previous Regimen: DRV/r + TDF/FTC  Current Regimen: DTG/Descovy  Labs: HIV 1 RNA Quant (copies/mL)  Date Value  08/17/2017 31 (H)  06/22/2017 41 (H)  02/24/2017 25 (H)   CD4 T Cell Abs (/uL)  Date Value  08/17/2017 290 (L)  06/22/2017 280 (L)  02/24/2017 240 (L)   Hep B S Ab (no units)  Date Value  06/05/2011 NEG   Hepatitis B Surface Ag (no units)  Date Value  06/05/2011 NEGATIVE   HCV Ab (no units)  Date Value  06/05/2011 NEGATIVE    CrCl: CrCl cannot be calculated (Patient's most recent lab result is older than the maximum 21 days allowed.).  Lipids:    Component Value Date/Time   CHOL 248 (H) 02/24/2017 1201   TRIG 103 02/24/2017 1201   HDL 76 02/24/2017 1201   CHOLHDL 3.3 02/24/2017 1201   VLDL 21  02/24/2017 1201   LDLCALC 151 (H) 02/24/2017 1201    Assessment: I talked to [name] yesterday so we can bring her back for an appt with Dr. Luciana Axeomer because she has missed a couple. She doesn't have a car so transportation has been an issue. She was able to point out the correct meds that she is currently on. She admitted to be adherent to her meds. Her VL in Oct was suppressed. Therefore, we will hold off labs today. She takes the med with or without food.   Judeth CornfieldStephanie tried to get her to meet with Cordelia PenSherry for some counseling at the last visit but it didn't work out due to scheduling conflict. Introduced her to EssigSherry today. Apparently, her husband doesn't treat her very nice along with her daughter. Her daughter has a 3yo kid now but she stated that she doesn't know who the father is. She doesn't take care of the baby that well. DSS won't let her take the custody of the baby.   Tried to schedule her to see Dr. Luciana Axeomer again but she doesn't know her work schedule yet. She usually works from the afternoon til midnight. She is going  to call back next week to make an appt.   Recommendations:  Cont Tivicay/Descovy daily Need to f/u with Dr. Rocky Morelomer  Detavious Rinn, PharmD, BCPS, AAHIVP, CPP Clinical Infectious Disease Pharmacist [facility] 10/06/2017, 4:40 PM

## 2017-10-12 ENCOUNTER — Ambulatory Visit: Payer: Self-pay | Admitting: Internal Medicine

## 2017-10-21 MED FILL — TIVICAY 50 MG TABLET: 50 | 30 days supply | Qty: 30 | Fill #2

## 2017-11-03 MED FILL — DESCOVY 200-25 MG TABS: 200-25 | 30 days supply | Qty: 30 | Fill #2

## 2017-12-07 MED FILL — DESCOVY 200-25 MG TABS: 200-25 | 30 days supply | Qty: 30 | Fill #3

## 2017-12-07 MED FILL — TIVICAY 50 MG TABLET: 50 | 30 days supply | Qty: 30 | Fill #3

## 2017-12-17 ENCOUNTER — Other Ambulatory Visit: Payer: Self-pay | Admitting: Pharmacist

## 2018-01-03 MED FILL — DESCOVY 200-25 MG TABS: 200-25 | 30 days supply | Qty: 30 | Fill #4

## 2018-01-03 MED FILL — TIVICAY 50 MG TABLET: 50 | 30 days supply | Qty: 30 | Fill #4

## 2018-01-24 ENCOUNTER — Other Ambulatory Visit: Payer: Self-pay | Admitting: Internal Medicine

## 2018-01-24 DIAGNOSIS — B20 Human immunodeficiency virus [HIV] disease: Secondary | ICD-10-CM

## 2018-01-25 ENCOUNTER — Ambulatory Visit (INDEPENDENT_AMBULATORY_CARE_PROVIDER_SITE_OTHER): Payer: 59 | Admitting: Internal Medicine

## 2018-01-25 ENCOUNTER — Encounter: Payer: Self-pay | Admitting: Internal Medicine

## 2018-01-25 VITALS — BP 121/82 | HR 72 | Temp 98.4°F | Resp 18 | Ht 60.0 in | Wt 143.0 lb

## 2018-01-25 DIAGNOSIS — Z79899 Other long term (current) drug therapy: Secondary | ICD-10-CM | POA: Diagnosis not present

## 2018-01-25 DIAGNOSIS — G479 Sleep disorder, unspecified: Secondary | ICD-10-CM

## 2018-01-25 DIAGNOSIS — B20 Human immunodeficiency virus [HIV] disease: Secondary | ICD-10-CM

## 2018-01-25 DIAGNOSIS — F5102 Adjustment insomnia: Secondary | ICD-10-CM

## 2018-01-25 DIAGNOSIS — Z113 Encounter for screening for infections with a predominantly sexual mode of transmission: Secondary | ICD-10-CM | POA: Diagnosis not present

## 2018-01-25 MED ORDER — TRAZODONE HCL 50 MG PO TABS: 50.0000 mg | ORAL_TABLET | Freq: Every evening | ORAL | 11 refills | Status: DC | PRN

## 2018-01-25 MED FILL — traZODone HCL 50 MG TABS: 50 | 30 days supply | Qty: 30 | Fill #0

## 2018-01-25 NOTE — Assessment & Plan Note (Signed)
Will check her lipid panel today 

## 2018-01-25 NOTE — Assessment & Plan Note (Signed)
Doing well by her report.  Labs today and will have her follow up in 4 months.

## 2018-01-25 NOTE — Assessment & Plan Note (Signed)
Will screen today 

## 2018-01-25 NOTE — Assessment & Plan Note (Signed)
Still with issues.  Did not restart the trazodone last time.  [facility]

## 2018-01-25 NOTE — Progress Notes (Signed)
   Subjective:    Patient ID: [name], female    DOB: 03/22/1971, 47 y.o.   MRN: [id number]  HPI Here for follow up of HIV Has been on Tivicay and Descovy and denies any missed doses. Last CD4 of 290 and viral load 31 copies.  Has had low level viremia.  Takes the medication daily.  Had missed some doses since it said to take with food, but counseled previoulsy to take anyway and is every day.  Recently had her menstrual cycle return.  Is going to go back to hergynecologist for further management.     Review of Systems  Gastrointestinal: Negative for diarrhea and nausea.  Skin: Negative for rash.       Objective:   Physical Exam  Constitutional: She appears well-developed and well-nourished. No distress.  HENT:  Mouth/Throat: No oropharyngeal exudate.  Eyes: No scleral icterus.  Cardiovascular: Normal rate, regular rhythm and normal heart sounds.  No murmur heard. Pulmonary/Chest: Effort normal and breath sounds normal. No respiratory distress.  Skin: No rash noted.   SH: no tobacco       Assessment & Plan:

## 2018-01-26 LAB — CBC WITH DIFFERENTIAL/PLATELET
BASOS ABS: 29 {cells}/uL (ref 0–200)
Basophils Relative: 0.7 %
Eosinophils Absolute: 271 cells/uL (ref 15–500)
Eosinophils Relative: 6.6 %
HEMATOCRIT: 41.4 % (ref 35.0–45.0)
Hemoglobin: 14.5 g/dL (ref 11.7–15.5)
LYMPHS ABS: 1976 {cells}/uL (ref 850–3900)
MCH: 31.7 pg (ref 27.0–33.0)
MCHC: 35 g/dL (ref 32.0–36.0)
MCV: 90.4 fL (ref 80.0–100.0)
MPV: 11 fL (ref 7.5–12.5)
Monocytes Relative: 10 %
NEUTROS PCT: 34.5 %
Neutro Abs: 1415 cells/uL — ABNORMAL LOW (ref 1500–7800)
Platelets: 207 10*3/uL (ref 140–400)
RBC: 4.58 10*6/uL (ref 3.80–5.10)
RDW: 12.4 % (ref 11.0–15.0)
TOTAL LYMPHOCYTE: 48.2 %
WBC: 4.1 10*3/uL (ref 3.8–10.8)
WBCMIX: 410 {cells}/uL (ref 200–950)

## 2018-01-26 LAB — COMPLETE METABOLIC PANEL WITH GFR
AG RATIO: 1.2 (calc) (ref 1.0–2.5)
ALT: 14 U/L (ref 6–29)
AST: 20 U/L (ref 10–35)
Albumin: 4.6 g/dL (ref 3.6–5.1)
Alkaline phosphatase (APISO): 96 U/L (ref 33–115)
BILIRUBIN TOTAL: 0.5 mg/dL (ref 0.2–1.2)
BUN: 13 mg/dL (ref 7–25)
CALCIUM: 10.3 mg/dL — AB (ref 8.6–10.2)
CHLORIDE: 103 mmol/L (ref 98–110)
CO2: 25 mmol/L (ref 20–32)
Creat: 1.06 mg/dL (ref 0.50–1.10)
GFR, EST AFRICAN AMERICAN: 73 mL/min/{1.73_m2} (ref >=60)
GFR, EST NON AFRICAN AMERICAN: 63 mL/min/{1.73_m2} (ref >=60)
GLUCOSE: 110 mg/dL — AB (ref 65–99)
Globulin: 3.8 g/dL (calc) — ABNORMAL HIGH (ref 1.9–3.7)
POTASSIUM: 4.3 mmol/L (ref 3.5–5.3)
Sodium: 137 mmol/L (ref 135–146)
TOTAL PROTEIN: 8.4 g/dL — AB (ref 6.1–8.1)

## 2018-01-26 LAB — LIPID PANEL
Cholesterol: 235 mg/dL — ABNORMAL HIGH (ref <200)
HDL: 68 mg/dL (ref >50)
LDL Cholesterol (Calc): 144 mg/dL (calc) — ABNORMAL HIGH
Non-HDL Cholesterol (Calc): 167 mg/dL (calc) — ABNORMAL HIGH (ref <130)
TRIGLYCERIDES: 117 mg/dL (ref <150)
Total CHOL/HDL Ratio: 3.5 (calc) (ref <5.0)

## 2018-01-26 LAB — RPR: RPR: NONREACTIVE

## 2018-01-26 LAB — T-HELPER CELL (CD4) - (RCID CLINIC ONLY)
CD4 T CELL HELPER: 14 % — AB (ref 33–55)
CD4 T Cell Abs: 290 /uL — ABNORMAL LOW (ref 400–2700)

## 2018-01-26 MED FILL — DESCOVY 200-25 MG TABS: 200-25 | 30 days supply | Qty: 30 | Fill #0

## 2018-01-26 MED FILL — TIVICAY 50 MG TABLET: 50 | 30 days supply | Qty: 30 | Fill #0

## 2018-01-28 LAB — HIV-1 RNA QUANT-NO REFLEX-BLD
HIV 1 RNA QUANT: 33 {copies}/mL — AB
HIV-1 RNA QUANT, LOG: 1.52 {Log_copies}/mL — AB

## 2018-03-09 MED FILL — TIVICAY 50 MG TABLET: 50 | 30 days supply | Qty: 30 | Fill #1

## 2018-03-09 MED FILL — traZODone HCL 50 MG TABS: 50 | 30 days supply | Qty: 30 | Fill #1

## 2018-03-09 MED FILL — DESCOVY 200-25 MG TABS: 200-25 | 30 days supply | Qty: 30 | Fill #1

## 2018-04-04 MED FILL — TIVICAY 50 MG TABLET: 50 | 30 days supply | Qty: 30 | Fill #2

## 2018-04-04 MED FILL — DESCOVY 200-25 MG TABS: 200-25 | 30 days supply | Qty: 30 | Fill #2

## 2018-04-04 MED FILL — traZODone HCL 50 MG TABS: 50 | 30 days supply | Qty: 30 | Fill #2

## 2018-04-08 ENCOUNTER — Telehealth: Payer: Self-pay | Admitting: Pharmacist Clinician (PhC)/ Clinical Pharmacy Specialist

## 2018-04-08 NOTE — Telephone Encounter (Signed)
Pt requested a PM appt due to work. Change to 8/26 at 2:45.

## 2018-04-29 MED FILL — TIVICAY 50 MG TABLET: 50 | 30 days supply | Qty: 30 | Fill #3

## 2018-04-29 MED FILL — DESCOVY 200-25 MG TABS: 200-25 | 30 days supply | Qty: 30 | Fill #3

## 2018-04-29 MED FILL — traZODone HCL 50 MG TABS: 50 | 30 days supply | Qty: 30 | Fill #3

## 2018-05-02 ENCOUNTER — Other Ambulatory Visit (HOSPITAL_COMMUNITY)
Admission: RE | Admit: 2018-05-02 | Discharge: 2018-05-02 | Disposition: A | Discharge status: Home / Self Care | Payer: 59 | Source: Ambulatory Visit | Attending: Obstetrics and Gynecology | Admitting: Obstetrics and Gynecology

## 2018-05-02 ENCOUNTER — Other Ambulatory Visit: Payer: Self-pay | Admitting: Obstetrics and Gynecology

## 2018-05-02 DIAGNOSIS — Z01411 Encounter for gynecological examination (general) (routine) with abnormal findings: Secondary | ICD-10-CM | POA: Insufficient documentation

## 2018-05-02 DIAGNOSIS — R5382 Chronic fatigue, unspecified: Secondary | ICD-10-CM | POA: Diagnosis not present

## 2018-05-02 DIAGNOSIS — R1906 Epigastric swelling, mass or lump: Secondary | ICD-10-CM | POA: Diagnosis not present

## 2018-05-02 DIAGNOSIS — N750 Cyst of Bartholin's gland: Secondary | ICD-10-CM | POA: Diagnosis not present

## 2018-05-06 ENCOUNTER — Other Ambulatory Visit: Payer: Self-pay | Admitting: Obstetrics and Gynecology

## 2018-05-06 DIAGNOSIS — R1906 Epigastric swelling, mass or lump: Secondary | ICD-10-CM

## 2018-05-06 LAB — CYTOLOGY - PAP
DIAGNOSIS: NEGATIVE
HPV: NOT DETECTED

## 2018-05-18 ENCOUNTER — Ambulatory Visit
Admission: RE | Admit: 2018-05-18 | Discharge: 2018-05-18 | Disposition: A | Discharge status: Home / Self Care | Payer: 59 | Source: Ambulatory Visit | Attending: Obstetrics and Gynecology | Admitting: Obstetrics and Gynecology

## 2018-05-18 DIAGNOSIS — R1906 Epigastric swelling, mass or lump: Secondary | ICD-10-CM

## 2018-05-18 DIAGNOSIS — K439 Ventral hernia without obstruction or gangrene: Secondary | ICD-10-CM | POA: Diagnosis not present

## 2018-05-18 MED ORDER — IOPAMIDOL (ISOVUE-300) INJECTION 61%
100.0000 mL | Freq: Once | INTRAVENOUS | Status: AC | PRN
  Administered 2018-05-18: 100 mL via INTRAVENOUS

## 2018-05-23 MED FILL — DESCOVY 200-25 MG TABS: 200-25 | 30 days supply | Qty: 30 | Fill #4

## 2018-05-23 MED FILL — TIVICAY 50 MG TABLET: 50 | 30 days supply | Qty: 30 | Fill #4

## 2018-06-13 ENCOUNTER — Other Ambulatory Visit: Payer: 59

## 2018-06-13 ENCOUNTER — Other Ambulatory Visit: Payer: Self-pay | Admitting: *Deleted

## 2018-06-13 ENCOUNTER — Other Ambulatory Visit: Payer: Self-pay | Admitting: Internal Medicine

## 2018-06-13 DIAGNOSIS — B20 Human immunodeficiency virus [HIV] disease: Secondary | ICD-10-CM

## 2018-06-15 LAB — CBC WITH DIFFERENTIAL/PLATELET
Basophils Absolute: 41 cells/uL (ref 0–200)
Basophils Relative: 1.1 %
Eosinophils Absolute: 200 cells/uL (ref 15–500)
Eosinophils Relative: 5.4 %
HEMATOCRIT: 40.4 % (ref 35.0–45.0)
Hemoglobin: 13.9 g/dL (ref 11.7–15.5)
LYMPHS ABS: 1750 {cells}/uL (ref 850–3900)
MCH: 31.6 pg (ref 27.0–33.0)
MCHC: 34.4 g/dL (ref 32.0–36.0)
MCV: 91.8 fL (ref 80.0–100.0)
MPV: 11 fL (ref 7.5–12.5)
Monocytes Relative: 8.9 %
NEUTROS PCT: 37.3 %
Neutro Abs: 1380 cells/uL — ABNORMAL LOW (ref 1500–7800)
Platelets: 195 10*3/uL (ref 140–400)
RBC: 4.4 10*6/uL (ref 3.80–5.10)
RDW: 12.4 % (ref 11.0–15.0)
Total Lymphocyte: 47.3 %
WBC: 3.7 10*3/uL — ABNORMAL LOW (ref 3.8–10.8)
WBCMIX: 329 {cells}/uL (ref 200–950)

## 2018-06-15 LAB — COMPLETE METABOLIC PANEL WITH GFR
AG Ratio: 1.2 (calc) (ref 1.0–2.5)
ALBUMIN MSPROF: 4.2 g/dL (ref 3.6–5.1)
ALKALINE PHOSPHATASE (APISO): 94 U/L (ref 33–115)
ALT: 13 U/L (ref 6–29)
AST: 18 U/L (ref 10–35)
BILIRUBIN TOTAL: 0.5 mg/dL (ref 0.2–1.2)
BUN / CREAT RATIO: 14 (calc) (ref 6–22)
BUN: 15 mg/dL (ref 7–25)
CHLORIDE: 106 mmol/L (ref 98–110)
CO2: 28 mmol/L (ref 20–32)
Calcium: 10.1 mg/dL (ref 8.6–10.2)
Creat: 1.11 mg/dL — ABNORMAL HIGH (ref 0.50–1.10)
GFR, EST AFRICAN AMERICAN: 69 mL/min/{1.73_m2} (ref >=60)
GFR, Est Non African American: 60 mL/min/{1.73_m2} (ref >=60)
Globulin: 3.5 g/dL (calc) (ref 1.9–3.7)
Glucose, Bld: 157 mg/dL — ABNORMAL HIGH (ref 65–99)
Potassium: 3.8 mmol/L (ref 3.5–5.3)
SODIUM: 142 mmol/L (ref 135–146)
TOTAL PROTEIN: 7.7 g/dL (ref 6.1–8.1)

## 2018-06-15 LAB — T-HELPER CELL (CD4) - (RCID CLINIC ONLY)
CD4 % Helper T Cell: 19 % — ABNORMAL LOW (ref 33–55)
CD4 T Cell Abs: 350 /uL — ABNORMAL LOW (ref 400–2700)

## 2018-06-15 LAB — HIV-1 RNA QUANT-NO REFLEX-BLD
HIV 1 RNA Quant: 86 copies/mL — ABNORMAL HIGH
HIV-1 RNA Quant, Log: 1.93 Log copies/mL — ABNORMAL HIGH

## 2018-06-22 ENCOUNTER — Ambulatory Visit: Payer: Self-pay | Admitting: Internal Medicine

## 2018-06-27 ENCOUNTER — Encounter: Payer: Self-pay | Admitting: Internal Medicine

## 2018-06-27 ENCOUNTER — Ambulatory Visit: Payer: 59 | Admitting: Internal Medicine

## 2018-06-27 VITALS — BP 121/76 | HR 73 | Temp 97.9°F | Ht 63.0 in | Wt 137.8 lb

## 2018-06-27 DIAGNOSIS — Z23 Encounter for immunization: Secondary | ICD-10-CM | POA: Diagnosis not present

## 2018-06-27 DIAGNOSIS — Z113 Encounter for screening for infections with a predominantly sexual mode of transmission: Secondary | ICD-10-CM | POA: Diagnosis not present

## 2018-06-27 DIAGNOSIS — R739 Hyperglycemia, unspecified: Secondary | ICD-10-CM | POA: Diagnosis not present

## 2018-06-27 DIAGNOSIS — K429 Umbilical hernia without obstruction or gangrene: Secondary | ICD-10-CM

## 2018-06-27 DIAGNOSIS — Z21 Asymptomatic human immunodeficiency virus [HIV] infection status: Secondary | ICD-10-CM | POA: Diagnosis not present

## 2018-06-27 NOTE — Assessment & Plan Note (Signed)
Counseled on Prevnar and given today 

## 2018-06-27 NOTE — Assessment & Plan Note (Signed)
Higher than typical.  Will continue to monitor and alert her PCP of results.

## 2018-06-27 NOTE — Progress Notes (Signed)
Prevnar vaccine administered to Right deltoid. Patient tolerated well. Informed patient of Flu clinic which will be offered and we will give her a call to schedule that when available.  S.Krissie Merrick,LPN

## 2018-06-27 NOTE — Assessment & Plan Note (Signed)
Doing well with some persistent low-level viremia.  rtc 6 months.

## 2018-06-27 NOTE — Assessment & Plan Note (Signed)
Having intermittent pain.  I will let her PCP know as she had not heard back since her CT.

## 2018-06-27 NOTE — Progress Notes (Signed)
   Subjective:    Patient ID: [name], female    DOB: 12/19/1970, 47 y.o.   MRN: [id number]  HPI Here for follow up of HIV Has been on Tivicay and Descovy and denies any missed doses. CD4 of 350 and viral load 86 copies.  Continues to have low level viremia.  Takes the medication daily.  No associated n/v.    Recently diagnosed with an umbilical hernia.  Waiting to here from her PCP regarding surgery.   Review of Systems  Gastrointestinal: Negative for diarrhea and nausea.  Skin: Negative for rash.       Objective:   Physical Exam  Constitutional: She appears well-developed and well-nourished. No distress.  HENT:  Mouth/Throat: No oropharyngeal exudate.  Eyes: No scleral icterus.  Cardiovascular: Normal rate, regular rhythm and normal heart sounds.  No murmur heard. Pulmonary/Chest: Effort normal and breath sounds normal. No respiratory distress.  Skin: No rash noted.   SH: no tobacco       Assessment & Plan:

## 2018-07-25 MED FILL — DESCOVY 200-25 MG TABS: 200-25 | 30 days supply | Qty: 30 | Fill #0

## 2018-07-25 MED FILL — TIVICAY 50 MG TABLET: 50 | 30 days supply | Qty: 30 | Fill #0

## 2018-08-04 DIAGNOSIS — K439 Ventral hernia without obstruction or gangrene: Secondary | ICD-10-CM | POA: Diagnosis not present

## 2018-08-18 ENCOUNTER — Ambulatory Visit (INDEPENDENT_AMBULATORY_CARE_PROVIDER_SITE_OTHER): Payer: 59

## 2018-08-18 DIAGNOSIS — Z23 Encounter for immunization: Secondary | ICD-10-CM | POA: Diagnosis not present

## 2018-09-06 ENCOUNTER — Other Ambulatory Visit: Payer: Self-pay | Admitting: Internal Medicine

## 2018-09-06 ENCOUNTER — Other Ambulatory Visit: Payer: Self-pay | Admitting: Pharmacist

## 2018-09-06 DIAGNOSIS — B20 Human immunodeficiency virus [HIV] disease: Secondary | ICD-10-CM

## 2018-09-06 MED ORDER — DOLUTEGRAVIR SODIUM 50 MG PO TABS: 50.0000 mg | ORAL_TABLET | Freq: Every day | ORAL | 4 refills | Status: DC

## 2018-09-06 MED ORDER — EMTRICITABINE-TENOFOVIR AF 200-25 MG PO TABS: 1.0000 | ORAL_TABLET | Freq: Every day | ORAL | 4 refills | Status: DC

## 2018-09-06 MED FILL — TIVICAY 50 MG TABLET: 50 | 30 days supply | Qty: 30 | Fill #0

## 2018-09-06 MED FILL — DESCOVY 200-25 MG TABS: 200-25 | 30 days supply | Qty: 30 | Fill #0

## 2018-09-06 NOTE — Progress Notes (Signed)
Refills sent to [facility].

## 2018-09-30 MED FILL — TIVICAY 50 MG TABLET: 50 | 30 days supply | Qty: 30 | Fill #1

## 2018-09-30 MED FILL — DESCOVY 200-25 MG TABS: 200-25 | 30 days supply | Qty: 30 | Fill #1

## 2018-10-17 DIAGNOSIS — K439 Ventral hernia without obstruction or gangrene: Secondary | ICD-10-CM | POA: Diagnosis not present

## 2018-10-17 DIAGNOSIS — R102 Pelvic and perineal pain: Secondary | ICD-10-CM | POA: Diagnosis not present

## 2018-10-17 DIAGNOSIS — N898 Other specified noninflammatory disorders of vagina: Secondary | ICD-10-CM | POA: Diagnosis not present

## 2018-10-17 DIAGNOSIS — N644 Mastodynia: Secondary | ICD-10-CM | POA: Diagnosis not present

## 2018-10-17 MED FILL — metroNIDAZOLE 500 MG TABS: 500 | 7 days supply | Qty: 14 | Fill #0

## 2018-10-18 ENCOUNTER — Other Ambulatory Visit: Payer: Self-pay | Admitting: Obstetrics and Gynecology

## 2018-10-18 DIAGNOSIS — N644 Mastodynia: Secondary | ICD-10-CM

## 2018-10-19 MED FILL — DESCOVY 200-25 MG TABS: 200-25 | 30 days supply | Qty: 30 | Fill #1

## 2018-10-19 MED FILL — TIVICAY 50 MG TABLET: 50 | 30 days supply | Qty: 30 | Fill #1

## 2018-10-21 ENCOUNTER — Other Ambulatory Visit: Payer: Self-pay

## 2018-11-16 MED FILL — DESCOVY 200-25 MG TABS: 200-25 | 30 days supply | Qty: 30 | Fill #2

## 2018-11-16 MED FILL — TIVICAY 50 MG TABLET: 50 | 30 days supply | Qty: 30 | Fill #2

## 2018-12-07 ENCOUNTER — Ambulatory Visit: Payer: Commercial Managed Care - PPO

## 2018-12-07 ENCOUNTER — Other Ambulatory Visit: Payer: 59

## 2018-12-08 ENCOUNTER — Other Ambulatory Visit: Payer: Commercial Managed Care - PPO

## 2018-12-12 ENCOUNTER — Other Ambulatory Visit: Payer: Commercial Managed Care - PPO

## 2018-12-12 ENCOUNTER — Other Ambulatory Visit (HOSPITAL_COMMUNITY)
Admission: RE | Admit: 2018-12-12 | Discharge: 2018-12-12 | Disposition: A | Discharge status: Home / Self Care | Payer: Commercial Managed Care - PPO | Source: Ambulatory Visit | Attending: Internal Medicine | Admitting: Internal Medicine

## 2018-12-12 DIAGNOSIS — Z21 Asymptomatic human immunodeficiency virus [HIV] infection status: Secondary | ICD-10-CM | POA: Insufficient documentation

## 2018-12-12 DIAGNOSIS — Z113 Encounter for screening for infections with a predominantly sexual mode of transmission: Secondary | ICD-10-CM

## 2018-12-12 MED FILL — DESCOVY 200-25 MG TABS: 200-25 | 30 days supply | Qty: 30 | Fill #3

## 2018-12-12 MED FILL — TIVICAY 50 MG TABLET: 50 | 30 days supply | Qty: 30 | Fill #3

## 2018-12-13 LAB — URINE CYTOLOGY ANCILLARY ONLY
Chlamydia: NEGATIVE
Neisseria Gonorrhea: NEGATIVE

## 2018-12-13 LAB — T-HELPER CELL (CD4) - (RCID CLINIC ONLY)
CD4 % Helper T Cell: 16 % — ABNORMAL LOW (ref 33–55)
CD4 T CELL ABS: 220 /uL — AB (ref 400–2700)

## 2018-12-14 LAB — COMPLETE METABOLIC PANEL WITH GFR
AG RATIO: 1.1 (calc) (ref 1.0–2.5)
ALKALINE PHOSPHATASE (APISO): 97 U/L (ref 31–125)
ALT: 23 U/L (ref 6–29)
AST: 38 U/L — AB (ref 10–35)
Albumin: 4.5 g/dL (ref 3.6–5.1)
BILIRUBIN TOTAL: 0.5 mg/dL (ref 0.2–1.2)
BUN: 12 mg/dL (ref 7–25)
CHLORIDE: 105 mmol/L (ref 98–110)
CO2: 26 mmol/L (ref 20–32)
Calcium: 10.6 mg/dL — ABNORMAL HIGH (ref 8.6–10.2)
Creat: 1.06 mg/dL (ref 0.50–1.10)
GFR, Est African American: 73 mL/min/{1.73_m2} (ref >=60)
GFR, Est Non African American: 63 mL/min/{1.73_m2} (ref >=60)
GLOBULIN: 4.1 g/dL — AB (ref 1.9–3.7)
Glucose, Bld: 92 mg/dL (ref 65–99)
POTASSIUM: 4.4 mmol/L (ref 3.5–5.3)
SODIUM: 141 mmol/L (ref 135–146)
Total Protein: 8.6 g/dL — ABNORMAL HIGH (ref 6.1–8.1)

## 2018-12-14 LAB — CBC WITH DIFFERENTIAL/PLATELET
Absolute Monocytes: 310 cells/uL (ref 200–950)
BASOS PCT: 0.6 %
Basophils Absolute: 20 cells/uL (ref 0–200)
EOS PCT: 3 %
Eosinophils Absolute: 99 cells/uL (ref 15–500)
HEMATOCRIT: 44.1 % (ref 35.0–45.0)
HEMOGLOBIN: 15.6 g/dL — AB (ref 11.7–15.5)
LYMPHS ABS: 1284 {cells}/uL (ref 850–3900)
MCH: 32.6 pg (ref 27.0–33.0)
MCHC: 35.4 g/dL (ref 32.0–36.0)
MCV: 92.3 fL (ref 80.0–100.0)
MPV: 11 fL (ref 7.5–12.5)
Monocytes Relative: 9.4 %
NEUTROS ABS: 1587 {cells}/uL (ref 1500–7800)
Neutrophils Relative %: 48.1 %
Platelets: 205 10*3/uL (ref 140–400)
RBC: 4.78 10*6/uL (ref 3.80–5.10)
RDW: 12.6 % (ref 11.0–15.0)
Total Lymphocyte: 38.9 %
WBC: 3.3 10*3/uL — AB (ref 3.8–10.8)

## 2018-12-14 LAB — RPR: RPR: NONREACTIVE

## 2018-12-14 LAB — HIV-1 RNA QUANT-NO REFLEX-BLD
HIV 1 RNA QUANT: NOT DETECTED {copies}/mL
HIV-1 RNA QUANT, LOG: NOT DETECTED {Log_copies}/mL

## 2018-12-21 ENCOUNTER — Ambulatory Visit (INDEPENDENT_AMBULATORY_CARE_PROVIDER_SITE_OTHER): Payer: 59 | Admitting: Internal Medicine

## 2018-12-21 ENCOUNTER — Encounter: Payer: Self-pay | Admitting: Internal Medicine

## 2018-12-21 VITALS — BP 140/84 | HR 59 | Temp 97.7°F | Ht 63.0 in | Wt 141.8 lb

## 2018-12-21 DIAGNOSIS — Z79899 Other long term (current) drug therapy: Secondary | ICD-10-CM

## 2018-12-21 DIAGNOSIS — Z23 Encounter for immunization: Secondary | ICD-10-CM

## 2018-12-21 DIAGNOSIS — G479 Sleep disorder, unspecified: Secondary | ICD-10-CM

## 2018-12-21 DIAGNOSIS — Z21 Asymptomatic human immunodeficiency virus [HIV] infection status: Secondary | ICD-10-CM

## 2018-12-21 DIAGNOSIS — F5102 Adjustment insomnia: Secondary | ICD-10-CM

## 2018-12-21 DIAGNOSIS — Z113 Encounter for screening for infections with a predominantly sexual mode of transmission: Secondary | ICD-10-CM

## 2018-12-21 DIAGNOSIS — R87612 Low grade squamous intraepithelial lesion on cytologic smear of cervix (LGSIL): Secondary | ICD-10-CM

## 2018-12-21 MED ORDER — TRAZODONE HCL 100 MG PO TABS: 100.0000 mg | ORAL_TABLET | Freq: Every evening | ORAL | 5 refills | Status: DC | PRN

## 2018-12-21 NOTE — Assessment & Plan Note (Signed)
Screened negative 

## 2018-12-21 NOTE — Progress Notes (Signed)
   Subjective:    Patient ID: [name], female    DOB: 1971/01/11, 48 y.o.   MRN: [id number]  HPI Here for follow up of HIV Has been on Tivicay and Descovy and denies any missed doses. CD4 of 220 and viral load < 20.   Takes the medication daily.  No associated n/v.   Creat wnl.  Due for PAP.     Review of Systems  Gastrointestinal: Negative for diarrhea and nausea.  Skin: Negative for rash.       Objective:   Physical Exam  Constitutional: She appears well-developed and well-nourished. No distress.  HENT:  Mouth/Throat: No oropharyngeal exudate.  Eyes: No scleral icterus.  Cardiovascular: Normal rate, regular rhythm and normal heart sounds.  No murmur heard. Pulmonary/Chest: Effort normal and breath sounds normal. No respiratory distress.  Skin: No rash noted.   SH: no tobacco       Assessment & Plan:

## 2018-12-21 NOTE — Assessment & Plan Note (Signed)
Will try a higher dose of trazodone.  

## 2018-12-21 NOTE — Assessment & Plan Note (Addendum)
Doing well and completely suppressed.  CD4 down some but no issues.  rtc 6 months.  Lipids managed by her PCP Dental referral placed today for [facility]. Information to schedule appointment completed today.

## 2018-12-21 NOTE — Assessment & Plan Note (Signed)
Discussed Menveo and given today #2 next visit and will need Pneumovax 23 as well

## 2018-12-21 NOTE — Assessment & Plan Note (Signed)
Has been reluctant in the pst to repeat.  Wants PAP now and will have her see our NP.  Also can consider mammogram, if indicated.

## 2018-12-21 NOTE — Addendum Note (Signed)
Addended by: Gildardo Griffes on: 12/21/2018 03:01 PM   Modules accepted: Orders

## 2019-01-05 MED FILL — DESCOVY 200-25 MG TABS: 200-25 | 30 days supply | Qty: 30 | Fill #4

## 2019-01-05 MED FILL — TIVICAY 50 MG TABLET: 50 | 30 days supply | Qty: 30 | Fill #4

## 2019-01-10 ENCOUNTER — Ambulatory Visit: Payer: Commercial Managed Care - PPO | Admitting: Infectious Diseases

## 2019-01-26 ENCOUNTER — Other Ambulatory Visit: Payer: Self-pay | Admitting: Pharmacist

## 2019-01-26 DIAGNOSIS — B20 Human immunodeficiency virus [HIV] disease: Secondary | ICD-10-CM

## 2019-01-26 MED ORDER — DOLUTEGRAVIR SODIUM 50 MG PO TABS: 50.0000 mg | ORAL_TABLET | Freq: Every day | ORAL | 5 refills | Status: DC

## 2019-01-26 MED ORDER — EMTRICITABINE-TENOFOVIR AF 200-25 MG PO TABS: 1.0000 | ORAL_TABLET | Freq: Every day | ORAL | 5 refills | Status: DC

## 2019-01-26 NOTE — Progress Notes (Signed)
Refills to [facility].  

## 2019-01-27 MED FILL — DESCOVY 200-25 MG TABS: 200-25 | 30 days supply | Qty: 30 | Fill #0

## 2019-01-27 MED FILL — TIVICAY 50 MG TABLET: 50 | 30 days supply | Qty: 30 | Fill #0

## 2019-02-18 MED FILL — DESCOVY 200-25 MG TABS: 200-25 | 30 days supply | Qty: 30 | Fill #1

## 2019-02-18 MED FILL — TIVICAY 50 MG TABLET: 50 | 30 days supply | Qty: 30 | Fill #1

## 2019-03-23 MED FILL — DESCOVY 200-25 MG TABS: 200-25 | 30 days supply | Qty: 30 | Fill #2

## 2019-03-23 MED FILL — TIVICAY 50 MG TABLET: 50 | 30 days supply | Qty: 30 | Fill #2

## 2019-04-17 MED FILL — TIVICAY 50 MG TABLET: 50 | 30 days supply | Qty: 30 | Fill #3

## 2019-04-17 MED FILL — DESCOVY 200-25 MG TABS: 200-25 | 30 days supply | Qty: 30 | Fill #3

## 2019-05-18 MED FILL — TIVICAY 50 MG TABLET: 50 | 30 days supply | Qty: 30 | Fill #4

## 2019-05-18 MED FILL — DESCOVY 200-25 MG TABS: 200-25 | 30 days supply | Qty: 30 | Fill #4

## 2019-06-12 MED FILL — DESCOVY 200-25 MG TABS: 200-25 | 30 days supply | Qty: 30 | Fill #5

## 2019-06-12 MED FILL — TIVICAY 50 MG TABLET: 50 | 30 days supply | Qty: 30 | Fill #5

## 2019-06-21 ENCOUNTER — Other Ambulatory Visit: Payer: Self-pay

## 2019-06-21 DIAGNOSIS — Z21 Asymptomatic human immunodeficiency virus [HIV] infection status: Secondary | ICD-10-CM | POA: Diagnosis not present

## 2019-06-22 LAB — T-HELPER CELL (CD4) - (RCID CLINIC ONLY)
CD4 % Helper T Cell: 17 % — ABNORMAL LOW (ref 33–65)
CD4 T Cell Abs: 388 /uL — ABNORMAL LOW (ref 400–1790)

## 2019-06-24 LAB — HIV-1 RNA QUANT-NO REFLEX-BLD
HIV 1 RNA Quant: 35 copies/mL — ABNORMAL HIGH
HIV-1 RNA Quant, Log: 1.54 Log copies/mL — ABNORMAL HIGH

## 2019-07-04 ENCOUNTER — Other Ambulatory Visit: Payer: Self-pay | Admitting: Pharmacist

## 2019-07-04 DIAGNOSIS — B20 Human immunodeficiency virus [HIV] disease: Secondary | ICD-10-CM

## 2019-07-05 ENCOUNTER — Encounter: Payer: Self-pay | Admitting: Internal Medicine

## 2019-07-11 MED FILL — TIVICAY 50 MG TABLET: 50 | 30 days supply | Qty: 30 | Fill #0

## 2019-07-11 MED FILL — DESCOVY 200-25 MG TABS: 200-25 | 30 days supply | Qty: 30 | Fill #0

## 2019-07-12 ENCOUNTER — Ambulatory Visit: Payer: Self-pay | Admitting: Internal Medicine

## 2019-08-07 ENCOUNTER — Other Ambulatory Visit: Payer: Self-pay | Admitting: Pharmacist

## 2019-08-07 ENCOUNTER — Telehealth: Payer: Self-pay | Admitting: Pharmacy Technician

## 2019-08-07 DIAGNOSIS — B20 Human immunodeficiency virus [HIV] disease: Secondary | ICD-10-CM

## 2019-08-07 MED ORDER — TIVICAY 50 MG PO TABS: 50.0000 mg | ORAL_TABLET | Freq: Every day | ORAL | 3 refills | Status: DC

## 2019-08-07 MED FILL — DESCOVY 200-25 MG TABS: 200-25 | 30 days supply | Qty: 30 | Fill #1

## 2019-08-07 MED FILL — TIVICAY 50 MG TABLET: 50 | 30 days supply | Qty: 30 | Fill #1

## 2019-08-07 NOTE — Progress Notes (Signed)
Patient no longer has insurance. Publishing copy Rx for ViiV patient assistance. Patient will also get assistance for Descovy through [place].

## 2019-08-07 NOTE — Telephone Encounter (Signed)
[facility] Patient Advocate Encounter  Completed and sent [employer] Advancing Access application for Descovy for this patient who is uninsured.    Patient is approved 08/07/2019 through 09/07/2019.  Application is sent in for additional assistance.  BIN      [id number]  PCN    [id number] GRP    [id number] ID        [id number]    Was successful in obtaining [employer]iv immediate access for Tivicay.  There are three fills at a local pharmacy within a lifetime.  A prescription has been sent in for additional months of assistance.  The additional months will be shipped from the manufacturers pharmacy directly to the patient.  The billing information is as follows and has been shared with [place].  RxBin: [id number] PCN: [id number] Member ID: [id number] Group ID: [id number]  [place] [name] [place] Patient [facility] for Infectious Disease Phone: [phone] Fax:  [phone]

## 2019-08-16 NOTE — Telephone Encounter (Signed)
[facility] Patient Advocate Encounter   Patient has been approved for [place] Advancing Access Patient Assistance Program for Descovy from 08/07/2019 to 08/06/2020. This assistance will make the patient's copay $0.  The billing information is the same as below note.  I spoke to the patient and she stated that their [facility] is still active. I tried to explain she might want to call HR and the process to find out why the status is saying inactive but she said thanks and hung up.  Bartholomew Crews, CPhT Specialty Pharmacy Patient [facility] for Infectious Disease Phone: [phone] Fax: [phone] 08/16/2019 11:02 AM

## 2019-08-23 ENCOUNTER — Ambulatory Visit: Payer: Self-pay

## 2019-08-23 ENCOUNTER — Ambulatory Visit: Payer: Self-pay | Admitting: Internal Medicine

## 2019-08-23 ENCOUNTER — Other Ambulatory Visit: Payer: Self-pay

## 2019-08-31 MED FILL — DESCOVY 200-25 MG TABS: 200-25 | 30 days supply | Qty: 30 | Fill #2

## 2019-08-31 MED FILL — TIVICAY 50 MG TABLET: 50 | 30 days supply | Qty: 30 | Fill #2

## 2019-10-02 MED FILL — TIVICAY 50 MG TABLET: 50 | 30 days supply | Qty: 30 | Fill #3

## 2019-10-02 MED FILL — DESCOVY 200-25 MG TABS: 200-25 | 30 days supply | Qty: 30 | Fill #3

## 2019-10-12 ENCOUNTER — Telehealth: Payer: Self-pay | Admitting: Pharmacy Technician

## 2019-10-12 NOTE — Telephone Encounter (Addendum)
[facility] Patient Advocate Encounter   Patient is approved for [employer] patient assistance for Tivicay.  Viiv has a prescription on file and will mail the medication directly to the patient.  The phone number for her to call for refills is [phone].  [name]. [name] [place] Patient [facility] for Infectious Disease Phone: [phone] Fax:  [phone]

## 2019-10-31 ENCOUNTER — Telehealth: Payer: Self-pay | Admitting: Pharmacy Technician

## 2019-10-31 NOTE — Telephone Encounter (Addendum)
[facility] Patient Advocate Encounter  Received notification from [facility] that they were having trouble filling her Tivicay. ViiV alerted that she had exceeded the allotted retail fills and would require mail order. I checked in to her insurance status and her husband's [employer] insurance is still active but during audits, it appears her coverage termed on 08/02/2019 and will reinstate on 11/03/2019. We will process the claim then using her insurance on 01/04 and add to the Descovy refill. Patient has 8 tablets remaining as of today and is good to wait until 01/05 to receive both.   When she comes for her next appointment we will need to inform her of the BMI audit for [employer] insurance spouse so this doesn't happen in 2021.

## 2019-11-06 ENCOUNTER — Other Ambulatory Visit: Payer: Self-pay | Admitting: *Deleted

## 2019-11-06 DIAGNOSIS — B20 Human immunodeficiency virus [HIV] disease: Secondary | ICD-10-CM

## 2019-11-06 MED ORDER — TIVICAY 50 MG PO TABS: 50.0000 mg | ORAL_TABLET | Freq: Every day | ORAL | 3 refills | Status: DC

## 2019-11-06 MED FILL — DESCOVY 200-25 MG TABS: 200-25 | 30 days supply | Qty: 30 | Fill #4

## 2019-11-07 MED FILL — TIVICAY 50 MG TABLET: 50 | 30 days supply | Qty: 30 | Fill #4

## 2019-11-08 ENCOUNTER — Telehealth: Payer: Self-pay

## 2019-11-08 NOTE — Telephone Encounter (Signed)
COVID-19 Pre-Screening Questions:11/08/19   Do you currently have a fever (>100 F), chills or unexplained body aches? NO  Are you currently experiencing new cough, shortness of breath, sore throat, runny nose?NO .  Have you recently travelled outside the state of [place] in the last 14 days? NO .  Have you been in contact with someone that is currently pending confirmation of Covid19 testing or has been confirmed to have the Covid19 virus?  NO   **If the patient answers NO to ALL questions -  advise the patient to please call the clinic before coming to the office should any symptoms develop.     

## 2019-11-09 ENCOUNTER — Ambulatory Visit (INDEPENDENT_AMBULATORY_CARE_PROVIDER_SITE_OTHER): Payer: Self-pay | Admitting: Internal Medicine

## 2019-11-09 ENCOUNTER — Other Ambulatory Visit: Payer: Self-pay

## 2019-11-09 ENCOUNTER — Other Ambulatory Visit (HOSPITAL_COMMUNITY)
Admission: RE | Admit: 2019-11-09 | Discharge: 2019-11-09 | Disposition: A | Discharge status: Home / Self Care | Payer: 59 | Source: Ambulatory Visit | Attending: Internal Medicine | Admitting: Internal Medicine

## 2019-11-09 ENCOUNTER — Encounter: Payer: Self-pay | Admitting: Internal Medicine

## 2019-11-09 VITALS — BP 129/84 | HR 70 | Temp 98.0°F | Wt 153.0 lb

## 2019-11-09 DIAGNOSIS — R5383 Other fatigue: Secondary | ICD-10-CM | POA: Insufficient documentation

## 2019-11-09 DIAGNOSIS — Z113 Encounter for screening for infections with a predominantly sexual mode of transmission: Secondary | ICD-10-CM | POA: Insufficient documentation

## 2019-11-09 DIAGNOSIS — Z79899 Other long term (current) drug therapy: Secondary | ICD-10-CM

## 2019-11-09 DIAGNOSIS — Z21 Asymptomatic human immunodeficiency virus [HIV] infection status: Secondary | ICD-10-CM

## 2019-11-09 DIAGNOSIS — Z23 Encounter for immunization: Secondary | ICD-10-CM | POA: Insufficient documentation

## 2019-11-09 NOTE — Assessment & Plan Note (Signed)
New problem.  I will check her TSH, HGB.

## 2019-11-09 NOTE — Assessment & Plan Note (Signed)
Will screen today 

## 2019-11-09 NOTE — Assessment & Plan Note (Signed)
Doing well and hopefully has her medications sorted out and will remain on them.  Her labs have been reassuring and will recheck today.   Otherwise, if no concerns, will rtc 6 months.

## 2019-11-09 NOTE — Progress Notes (Signed)
   Subjective:    Patient ID: [name], female    DOB: 03/09/1971, 49 y.o.   MRN: [id number]  HPI Here for follow up of HIV She continues on Tivicay and Descovy and denies any missed doses.  She has had recent insurance issues but back on track now.  She has recently felt more fatigue and lightheadedness that is new.  No weight loss, no associated n/v/d.     Review of Systems  Constitutional: Positive for fatigue. Negative for chills.  Gastrointestinal: Negative for diarrhea and nausea.  Skin: Negative for rash.  Neurological: Positive for dizziness.       Objective:   Physical Exam Constitutional:      Appearance: Normal appearance.  Eyes:     General: No scleral icterus. Cardiovascular:     Rate and Rhythm: Normal rate and regular rhythm.     Heart sounds: No murmur.  Pulmonary:     Effort: Pulmonary effort is normal.  Neurological:     General: No focal deficit present.     Mental Status: She is alert.  Psychiatric:        Mood and Affect: Mood normal.   SH; no tobacco        Assessment & Plan:

## 2019-11-09 NOTE — Assessment & Plan Note (Signed)
Will do her lipid panel today

## 2019-11-09 NOTE — Assessment & Plan Note (Signed)
Discussed pneumonvax and given today

## 2019-11-10 LAB — T-HELPER CELL (CD4) - (RCID CLINIC ONLY)
CD4 % Helper T Cell: 18 % — ABNORMAL LOW (ref 33–65)
CD4 T Cell Abs: 292 /uL — ABNORMAL LOW (ref 400–1790)

## 2019-11-10 LAB — URINE CYTOLOGY ANCILLARY ONLY
Chlamydia: NEGATIVE
Comment: NEGATIVE
Comment: NORMAL
Neisseria Gonorrhea: NEGATIVE

## 2019-11-17 LAB — HIV-1 RNA QUANT-NO REFLEX-BLD
HIV 1 RNA Quant: <20 copies/mL — AB
HIV-1 RNA Quant, Log: <1.3 Log copies/mL — AB

## 2019-11-17 LAB — CBC WITH DIFFERENTIAL/PLATELET
Absolute Monocytes: 259 cells/uL (ref 200–950)
Basophils Absolute: 29 cells/uL (ref 0–200)
Basophils Relative: 0.9 %
Eosinophils Absolute: 230 cells/uL (ref 15–500)
Eosinophils Relative: 7.2 %
HCT: 39 % (ref 35.0–45.0)
Hemoglobin: 13.6 g/dL (ref 11.7–15.5)
Lymphs Abs: 1661 cells/uL (ref 850–3900)
MCH: 32 pg (ref 27.0–33.0)
MCHC: 34.9 g/dL (ref 32.0–36.0)
MCV: 91.8 fL (ref 80.0–100.0)
MPV: 11 fL (ref 7.5–12.5)
Monocytes Relative: 8.1 %
Neutro Abs: 1021 cells/uL — ABNORMAL LOW (ref 1500–7800)
Neutrophils Relative %: 31.9 %
Platelets: 193 10*3/uL (ref 140–400)
RBC: 4.25 10*6/uL (ref 3.80–5.10)
RDW: 12.9 % (ref 11.0–15.0)
Total Lymphocyte: 51.9 %
WBC: 3.2 10*3/uL — ABNORMAL LOW (ref 3.8–10.8)

## 2019-11-17 LAB — COMPLETE METABOLIC PANEL WITH GFR
AG Ratio: 1.3 (calc) (ref 1.0–2.5)
ALT: 17 U/L (ref 6–29)
AST: 20 U/L (ref 10–35)
Albumin: 4.3 g/dL (ref 3.6–5.1)
Alkaline phosphatase (APISO): 98 U/L (ref 31–125)
BUN: 10 mg/dL (ref 7–25)
CO2: 28 mmol/L (ref 20–32)
Calcium: 9.9 mg/dL (ref 8.6–10.2)
Chloride: 104 mmol/L (ref 98–110)
Creat: 1 mg/dL (ref 0.50–1.10)
GFR, Est African American: 78 mL/min/{1.73_m2} (ref >=60)
GFR, Est Non African American: 67 mL/min/{1.73_m2} (ref >=60)
Globulin: 3.2 g/dL (calc) (ref 1.9–3.7)
Glucose, Bld: 115 mg/dL — ABNORMAL HIGH (ref 65–99)
Potassium: 3.6 mmol/L (ref 3.5–5.3)
Sodium: 140 mmol/L (ref 135–146)
Total Bilirubin: 0.3 mg/dL (ref 0.2–1.2)
Total Protein: 7.5 g/dL (ref 6.1–8.1)

## 2019-11-17 LAB — LIPID PANEL
Cholesterol: 240 mg/dL — ABNORMAL HIGH (ref <200)
HDL: 55 mg/dL (ref >=50)
LDL Cholesterol (Calc): 154 mg/dL (calc) — ABNORMAL HIGH
Non-HDL Cholesterol (Calc): 185 mg/dL (calc) — ABNORMAL HIGH (ref <130)
Total CHOL/HDL Ratio: 4.4 (calc) (ref <5.0)
Triglycerides: 171 mg/dL — ABNORMAL HIGH (ref <150)

## 2019-11-17 LAB — TSH: TSH: 0.87 mIU/L

## 2019-11-17 LAB — RPR: RPR Ser Ql: NONREACTIVE

## 2019-12-06 MED FILL — TIVICAY 50 MG TABLET: 50 | 30 days supply | Qty: 30 | Fill #5

## 2019-12-06 MED FILL — DESCOVY 200-25 MG TABS: 200-25 | 30 days supply | Qty: 30 | Fill #5

## 2019-12-28 ENCOUNTER — Other Ambulatory Visit: Payer: Self-pay | Admitting: Internal Medicine

## 2019-12-28 DIAGNOSIS — B20 Human immunodeficiency virus [HIV] disease: Secondary | ICD-10-CM

## 2019-12-29 MED FILL — DESCOVY 200-25 MG TABS: 200-25 | 30 days supply | Qty: 30 | Fill #0

## 2019-12-29 MED FILL — TIVICAY 50 MG TABLET: 50 | 30 days supply | Qty: 30 | Fill #0

## 2020-01-29 MED FILL — TIVICAY 50 MG TABLET: 50 | 30 days supply | Qty: 30 | Fill #1

## 2020-01-29 MED FILL — DESCOVY 200-25 MG TABS: 200-25 | 30 days supply | Qty: 30 | Fill #1

## 2020-02-27 ENCOUNTER — Other Ambulatory Visit: Payer: Self-pay | Admitting: Internal Medicine

## 2020-02-27 DIAGNOSIS — B20 Human immunodeficiency virus [HIV] disease: Secondary | ICD-10-CM

## 2020-02-28 MED FILL — DESCOVY 200-25 MG TABS: 200-25 | 30 days supply | Qty: 30 | Fill #0

## 2020-02-28 MED FILL — TIVICAY 50 MG TABLET: 50 | 30 days supply | Qty: 30 | Fill #0

## 2020-05-01 ENCOUNTER — Other Ambulatory Visit: Payer: Self-pay | Admitting: Internal Medicine

## 2020-05-01 DIAGNOSIS — B20 Human immunodeficiency virus [HIV] disease: Secondary | ICD-10-CM

## 2020-05-02 MED FILL — DESCOVY 200-25 MG TABS: 200-25 | 30 days supply | Qty: 30 | Fill #0

## 2020-05-02 MED FILL — TIVICAY 50 MG TABLET: 50 | 30 days supply | Qty: 30 | Fill #0

## 2020-05-31 MED FILL — DESCOVY 200-25 MG TABS: 200-25 | 30 days supply | Qty: 30 | Fill #1

## 2020-05-31 MED FILL — TIVICAY 50 MG TABLET: 50 | 30 days supply | Qty: 30 | Fill #1

## 2020-06-05 ENCOUNTER — Encounter: Payer: Self-pay | Admitting: Internal Medicine

## 2020-06-05 ENCOUNTER — Ambulatory Visit: Payer: 59 | Admitting: Internal Medicine

## 2020-06-05 ENCOUNTER — Other Ambulatory Visit: Payer: Self-pay

## 2020-06-05 VITALS — BP 138/81 | Temp 98.4°F | Wt 147.0 lb

## 2020-06-05 DIAGNOSIS — R42 Dizziness and giddiness: Secondary | ICD-10-CM | POA: Diagnosis not present

## 2020-06-05 DIAGNOSIS — Z21 Asymptomatic human immunodeficiency virus [HIV] infection status: Secondary | ICD-10-CM

## 2020-06-05 DIAGNOSIS — R5383 Other fatigue: Secondary | ICD-10-CM

## 2020-06-05 NOTE — Assessment & Plan Note (Signed)
Will check her labs today and rtc in 6 months unless concerns.

## 2020-06-05 NOTE — Assessment & Plan Note (Signed)
This has been an ongoing issue and I offered referral to PT but she is not interested at this time.

## 2020-06-05 NOTE — Progress Notes (Signed)
   Subjective:    Patient ID: [name], female    DOB: Mar 15, 1971, 49 y.o.   MRN: [id number]  HPI Here for follow up of HIV She continues on Tivicay and descovy and denies any missed doses.  No labs prior to todays visit but previously were good with a CD4 of 292 and viral load < 20.  She continues to have the same fatigue and vertigo symptoms.  She is interested in getting her husband in for partner testing.     Review of Systems  Constitutional: Positive for fatigue. Negative for chills.  Gastrointestinal: Negative for diarrhea and nausea.  Skin: Negative for rash.  Neurological: Positive for light-headedness.       Objective:   Physical Exam Constitutional:      Appearance: Normal appearance.  Eyes:     General: No scleral icterus. Cardiovascular:     Rate and Rhythm: Normal rate and regular rhythm.     Heart sounds: No murmur heard.   Pulmonary:     Effort: Pulmonary effort is normal.  Neurological:     General: No focal deficit present.     Mental Status: She is alert.  Psychiatric:        Mood and Affect: Mood normal.   SH; no tobacco        Assessment & Plan:

## 2020-06-05 NOTE — Assessment & Plan Note (Signed)
This also is an ongoing issue she brought up again.  She does not always sleep well but recently started on melatonin and seems to be improving.  Some of this may be related to depression but she continues to not be interested in any counseling.   Will continue to monitor.

## 2020-06-06 LAB — T-HELPER CELL (CD4) - (RCID CLINIC ONLY)
CD4 % Helper T Cell: 16 % — ABNORMAL LOW (ref 33–65)
CD4 T Cell Abs: 391 /uL — ABNORMAL LOW (ref 400–1790)

## 2020-06-07 LAB — HIV-1 RNA QUANT-NO REFLEX-BLD
HIV 1 RNA Quant: 36 Copies/mL — ABNORMAL HIGH
HIV-1 RNA Quant, Log: 1.56 Log cps/mL — ABNORMAL HIGH

## 2020-07-16 ENCOUNTER — Other Ambulatory Visit (HOSPITAL_COMMUNITY): Payer: Self-pay | Admitting: Internal Medicine

## 2020-07-16 DIAGNOSIS — F3341 Major depressive disorder, recurrent, in partial remission: Secondary | ICD-10-CM | POA: Diagnosis not present

## 2020-07-16 DIAGNOSIS — Z1231 Encounter for screening mammogram for malignant neoplasm of breast: Secondary | ICD-10-CM | POA: Diagnosis not present

## 2020-07-16 DIAGNOSIS — Z0000 Encounter for general adult medical examination without abnormal findings: Secondary | ICD-10-CM | POA: Diagnosis not present

## 2020-07-16 DIAGNOSIS — Z23 Encounter for immunization: Secondary | ICD-10-CM | POA: Diagnosis not present

## 2020-07-16 DIAGNOSIS — F5105 Insomnia due to other mental disorder: Secondary | ICD-10-CM | POA: Diagnosis not present

## 2020-07-16 DIAGNOSIS — Z13220 Encounter for screening for lipoid disorders: Secondary | ICD-10-CM | POA: Diagnosis not present

## 2020-07-16 MED FILL — ESCITALOPRAM 20 MG TABLET: 20 | 30 days supply | Qty: 30 | Fill #0

## 2020-07-17 ENCOUNTER — Other Ambulatory Visit: Payer: Self-pay | Admitting: Internal Medicine

## 2020-07-17 DIAGNOSIS — B20 Human immunodeficiency virus [HIV] disease: Secondary | ICD-10-CM

## 2020-07-18 MED FILL — TIVICAY 50 MG TABLET: 50 | 30 days supply | Qty: 30 | Fill #0

## 2020-07-18 MED FILL — DESCOVY 200-25 MG TABS: 200-25 | 30 days supply | Qty: 30 | Fill #0

## 2020-07-31 DIAGNOSIS — R42 Dizziness and giddiness: Secondary | ICD-10-CM | POA: Diagnosis not present

## 2020-07-31 DIAGNOSIS — F99 Mental disorder, not otherwise specified: Secondary | ICD-10-CM | POA: Diagnosis not present

## 2020-07-31 DIAGNOSIS — F3341 Major depressive disorder, recurrent, in partial remission: Secondary | ICD-10-CM | POA: Diagnosis not present

## 2020-07-31 DIAGNOSIS — Z8661 Personal history of infections of the central nervous system: Secondary | ICD-10-CM | POA: Diagnosis not present

## 2020-07-31 DIAGNOSIS — Z21 Asymptomatic human immunodeficiency virus [HIV] infection status: Secondary | ICD-10-CM | POA: Diagnosis not present

## 2020-07-31 DIAGNOSIS — R350 Frequency of micturition: Secondary | ICD-10-CM | POA: Diagnosis not present

## 2020-07-31 DIAGNOSIS — E785 Hyperlipidemia, unspecified: Secondary | ICD-10-CM | POA: Diagnosis not present

## 2020-08-07 ENCOUNTER — Other Ambulatory Visit (HOSPITAL_COMMUNITY): Payer: Self-pay | Admitting: Internal Medicine

## 2020-08-08 MED FILL — CIPROFLOXACIN HCL 500 MG TA: 500 | 7 days supply | Qty: 14 | Fill #0

## 2020-08-12 MED FILL — ESCITALOPRAM 20 MG TABLET: 20 | 30 days supply | Qty: 30 | Fill #1

## 2020-08-12 MED FILL — DESCOVY 200-25 MG TABS: 200-25 | 30 days supply | Qty: 30 | Fill #1

## 2020-08-12 MED FILL — TIVICAY 50 MG TABLET: 50 | 30 days supply | Qty: 30 | Fill #1

## 2020-09-05 MED FILL — DESCOVY 200-25 MG TABS: 200-25 | 30 days supply | Qty: 30 | Fill #2

## 2020-09-05 MED FILL — TIVICAY 50 MG TABLET: 50 | 30 days supply | Qty: 30 | Fill #2

## 2020-09-05 MED FILL — ESCITALOPRAM 20 MG TABLET: 20 | 30 days supply | Qty: 30 | Fill #2

## 2020-09-16 DIAGNOSIS — B9620 Unspecified Escherichia coli [E. coli] as the cause of diseases classified elsewhere: Secondary | ICD-10-CM | POA: Diagnosis not present

## 2020-09-16 DIAGNOSIS — H8110 Benign paroxysmal vertigo, unspecified ear: Secondary | ICD-10-CM | POA: Diagnosis not present

## 2020-09-16 DIAGNOSIS — R739 Hyperglycemia, unspecified: Secondary | ICD-10-CM | POA: Diagnosis not present

## 2020-09-16 DIAGNOSIS — N390 Urinary tract infection, site not specified: Secondary | ICD-10-CM | POA: Diagnosis not present

## 2020-10-11 ENCOUNTER — Ambulatory Visit (INDEPENDENT_AMBULATORY_CARE_PROVIDER_SITE_OTHER): Payer: 59

## 2020-10-11 ENCOUNTER — Other Ambulatory Visit: Payer: Self-pay

## 2020-10-11 ENCOUNTER — Ambulatory Visit (INDEPENDENT_AMBULATORY_CARE_PROVIDER_SITE_OTHER): Payer: 59 | Admitting: Infectious Diseases

## 2020-10-11 ENCOUNTER — Encounter: Payer: Self-pay | Admitting: Infectious Diseases

## 2020-10-11 ENCOUNTER — Other Ambulatory Visit: Payer: Self-pay | Admitting: Infectious Diseases

## 2020-10-11 DIAGNOSIS — B20 Human immunodeficiency virus [HIV] disease: Secondary | ICD-10-CM

## 2020-10-11 DIAGNOSIS — Z23 Encounter for immunization: Secondary | ICD-10-CM | POA: Diagnosis not present

## 2020-10-11 MED ORDER — TIVICAY 50 MG PO TABS: 50.0000 mg | ORAL_TABLET | Freq: Every day | ORAL | 4 refills | Status: DC

## 2020-10-11 MED ORDER — AMOXICILLIN-POT CLAVULANATE 875-125 MG PO TABS: 1.0000 | ORAL_TABLET | Freq: Two times a day (BID) | ORAL | 0 refills | Status: DC

## 2020-10-11 MED ORDER — DESCOVY 200-25 MG PO TABS: 1.0000 | ORAL_TABLET | Freq: Every day | ORAL | 4 refills | Status: DC

## 2020-10-11 MED ORDER — IBUPROFEN 200 MG PO TABS: 200.0000 mg | ORAL_TABLET | Freq: Four times a day (QID) | ORAL | 0 refills | Status: DC | PRN

## 2020-10-11 MED FILL — TIVICAY 50 MG TABLET: 50 | 30 days supply | Qty: 30 | Fill #0

## 2020-10-11 MED FILL — DESCOVY 200-25 MG TABS: 200-25 | 30 days supply | Qty: 30 | Fill #0

## 2020-10-11 MED FILL — AMOX-CLAV 875-125 MG TABLET: 875-125 | 7 days supply | Qty: 14 | Fill #0

## 2020-10-11 NOTE — Progress Notes (Addendum)
[facility]                                                             [address], [place], [place], [address]                                                                  Phn. [phone]; Fax: [phone]                                                                             Date: 10/11/20  Reason for Visit: walk in visit for tooth pain   Assessment 1. Tooth pain, Broken tooth of left lower mandible  -No sinus tenderness. -No neurological symptoms -Neurological exam intact   2. HIV well controlled    Plan Will give him a week of Augmentin for concerns of periodontitis until she is able to be seen by her dentist next week She can take pain medications like Tylenol, Ibuprofen for pain FU with Dentist next week Fu with Dr Luciana Axe as previously planned in 2-3 months for continuing HIV care Discussed with her if she continues to have severe pain/fever/worsening symptoms, she will need to to go to the hospital for further evaluation/Imaging.  All questions and concerns were discussed and addressed. Patient verbalized understanding of the plan. ____________________________________________________________________________________________________________________  HPI: 49 Y O Female with a PMH of HIV ( well controlled)., depression, h/o JC encephalitis in 2012 who came in as a walk in visit for tooth pain. She says she started having pain in her left lower mandibular tooth 3 days ago and pain was very severe last night and she could not sleep. She follows up with a dentist at [facility] but they are not available today. Denies fever, chills and sweats. Denies any nausea, vomiting and diarrhea. Denies any headache, sinus pain/tenderness/swelling. Denies any ear pain/discharge. Denies any eye pain or blurry vision. She says the pain goes from her tooth to her heart. She follows up with a Dentist  here and has not seen them recently. She says she has had multiple teeth pulled out. She says she will follow up with them next week .   In terms of her HIV, she follows up with Dr Luciana Axe and is taking Descovy and Tivicay for HIV. Last VL and Cd4 was 36 and 391 ( 16%) respectively. She is taking her medications regularly and denies missing any doses. Denies any concerns with ART. No barriers to adherence of treatment.   ROS: Constitutional: Negative for fever, chills, activity change, appetite change, fatigue and unexpected weight change.  HENT: Negative for congestion, sore throat, rhinorrhea, sneezing, trouble swallowing and sinus pressure.  Eyes: Negative for photophobia and visual disturbance.  Respiratory: Negative for cough, chest tightness, shortness of breath,  wheezing and stridor.  Cardiovascular: Negative for chest pain, palpitations and leg swelling.  Gastrointestinal: Negative for nausea, vomiting, abdominal pain, diarrhea, constipation, blood in stool, abdominal distention and anal bleeding.  Genitourinary: Negative for dysuria, hematuria, flank pain and difficulty urinating.  Musculoskeletal: Negative for myalgias, back pain, joint swelling, arthralgias and gait problem.  Skin: Negative for color change, pallor, rash and wound.  Neurological: Negative for dizziness, tremors, weakness and light-headedness.  Hematological: Negative for adenopathy. Does not bruise/bleed easily.  Psychiatric/Behavioral: Negative for behavioral problems, confusion, sleep disturbance, dysphoric mood, decreased concentration and agitation.   Past Medical History:  Diagnosis Date  . Abnormal Pap smear   . Back pain   . Depression   . HIV disease [facility])    Current Outpatient Medications on File Prior to Visit  Medication Sig Dispense Refill  . traZODone (DESYREL) 100 MG tablet Take 1 tablet (100 mg total) by mouth at bedtime as needed for sleep. (Patient not taking: Reported on 06/05/2020) 30 tablet 5    No current facility-administered medications on file prior to visit.   No Known Allergies  Social History   Socioeconomic History  . Marital status: Married    Spouse name: Not on file  . Number of children: Not on file  . Years of education: Not on file  . Highest education level: Not on file  Occupational History  . Not on file  Tobacco Use  . Smoking status: Never Smoker  . Smokeless tobacco: Never Used  Substance and Sexual Activity  . Alcohol use: No  . Drug use: No  . Sexual activity: Yes    Partners: Male    Birth control/protection: None    Comment: pt given condoms  Other Topics Concern  . Not on file  Social History Narrative  . Not on file   Social Determinants of Health   Financial Resource Strain: Not on file  Food Insecurity: Not on file  Transportation Needs: Not on file  Physical Activity: Not on file  Stress: Not on file  Social Connections: Not on file  Intimate Partner Violence: Not on file     Vitals   Examination  General - not in acute distress, comfortably sitting in chair HEENT - PEERLA, no pallor and no icterus, MULTIPLE MISSING TEETH, BROKEN TOOTH IN THE LEFT LOWER MANDIBLE - NO PUS, NO ORAL CANDIDIASIS, NO ERYTHEMA Chest - b/l clear air entry, no additional sounds CVS- Normal s1s2, RRR Abdomen - Soft, Non tender , non distended Ext- no pedal edema Neuro: grossly normal Back - WNL Psych : calm and cooperative   Recent labs CBC Latest Ref Rng & Units 11/09/2019 12/12/2018 06/13/2018  WBC 3.8 - 10.8 Thousand/uL 3.2(L) 3.3(L) 3.7(L)  Hemoglobin 11.7 - 15.5 g/dL 00.1 15.6(H) 13.9  Hematocrit 35.0 - 45.0 % 39.0 44.1 40.4  Platelets 140 - 400 Thousand/uL 193 205 195   CMP Latest Ref Rng & Units 11/09/2019 12/12/2018 06/13/2018  Glucose 65 - 99 mg/dL 749(S) 92 496(P)  BUN 7 - 25 mg/dL 10 12 15   Creatinine 0.50 - 1.10 mg/dL 5.91 6.38)  Sodium 135 - 146 mmol/L 140 141 142  Potassium 3.5 - 5.3 mmol/L 3.6 4.4 3.8  Chloride 98 -  110 mmol/L 104 105 106  CO2 20 - 32 mmol/L 28 26 28   Calcium 8.6 - 10.2 mg/dL 9.9 10.6(H) 10.1  Total Protein 6.1 - 8.1 g/dL 7.5 4.66(Z) 7.7  Total Bilirubin 0.2 - 1.2 mg/dL 0.3 0.5 0.5  Alkaline Phos 33 -  115 U/L - - -  AST 10 - 35 U/L 20 38(H) 18  ALT 6 - 29 U/L 17 23 13      Pertinent Microbiology Results for orders placed or performed during the hospital encounter of 02/04/14  Urine culture     Status: None   Collection Time: 02/04/14  6:03 PM   Specimen: Urine, Clean Catch  Result Value Ref Range Status   Specimen Description URINE, CLEAN CATCH  Final   Special Requests NONE  Final   Culture  Setup Time   Final    02/04/2014 21:11 Performed at 04/06/2014 Count   Final    >=100,000 COLONIES/ML Performed at [facility]   Culture   Final    ESCHERICHIA COLI Performed at [facility]   Report Status 02/06/2014 FINAL  Final   Organism ID, Bacteria ESCHERICHIA COLI  Final      Susceptibility   Escherichia coli - MIC*    AMPICILLIN >=32 RESISTANT Resistant     CEFAZOLIN <=4 SENSITIVE Sensitive     CEFTRIAXONE <=1 SENSITIVE Sensitive     CIPROFLOXACIN >=4 RESISTANT Resistant     GENTAMICIN <=1 SENSITIVE Sensitive     LEVOFLOXACIN >=8 RESISTANT Resistant     NITROFURANTOIN <=16 SENSITIVE Sensitive     TOBRAMYCIN <=1 SENSITIVE Sensitive     TRIMETH/SULFA >=320 RESISTANT Resistant     PIP/TAZO <=4 SENSITIVE Sensitive     * ESCHERICHIA COLI  CSF culture     Status: None   Collection Time: 02/04/14  8:24 PM   Specimen: Cerebrospinal Fluid  Result Value Ref Range Status   Specimen Description CSF  Final   Special Requests NONE  Final   Gram Stain   Final    CYTOSPIN PREP WBC PRESENT, PREDOMINANTLY MONONUCLEAR NO ORGANISMS SEEN Gram Stain Report Called to,Read Back By and Verified With: Gram Stain Report Called to,Read Back By and Verified With: DUDLEY F/ED @2226  ON 02/04/14 BY KARCZEWSKI S. Performed by [facility] Performed at  [facility]   Culture   Final    NO GROWTH 3 DAYS Performed at [facility]   Report Status 02/08/2014 FINAL  Final  Gram stain     Status: None   Collection Time: 02/04/14  8:24 PM   Specimen: Cerebrospinal Fluid  Result Value Ref Range Status   Specimen Description CSF  Final   Special Requests NONE  Final   Gram Stain   Final    NO ORGANISMS SEEN WBC PRESENT, PREDOMINANTLY MONONUCLEAR CYTOSPIN PREP Gram Stain Report Called to,Read Back By and Verified With: DUDLEY,F/ED @2226  ON 02/04/14 BY KARCZEWSKI,S.   Report Status 02/04/2014 FINAL  Final     All pertinent labs/Imagings/notes reviewed. All pertinent plain films and CT images have been personally visualized and interpreted; radiology reports have been reviewed. Decision making incorporated into the Impression / Recommendations.  I spent greater than 25 minutes with the patient including  review of prior medical records with greater than 50% of time in face to face counsel of the patient.    Electronically signed by:  , MD Infectious Disease Physician [facility] [address] [place], Odette Fraction [facility] Phone: [phone]  Fax: [phone]

## 2020-10-11 NOTE — Progress Notes (Signed)
   Covid-19 Vaccination Clinic  Name:  [name]    MRN: [id number] DOB: 28-May-1971  10/11/2020  [name] was observed post Covid-19 immunization for 15 minutes without incident. She was provided with Vaccine Information Sheet and instruction to access the V-Safe system.   [name] was instructed to call 911 with any severe reactions post vaccine: Marland Kitchen Difficulty breathing  . Swelling of face and throat  . A fast heartbeat  . A bad rash all over body  . Dizziness and weakness   Immunizations Administered    Name Date Dose VIS Date Route   Pfizer COVID-19 Vaccine 10/11/2020  9:06 AM 0.3 mL 08/21/2020 Intramuscular   Manufacturer: [employer], [employer]   Lot: [id number]   NDC: [address]-7     Rosanna Randy, RN

## 2020-10-17 LAB — HIV-1 RNA ULTRAQUANT REFLEX TO GENTYP+
HIV 1 RNA Quant: 28 copies/mL — ABNORMAL HIGH
HIV-1 RNA Quant, Log: 1.45 Log copies/mL — ABNORMAL HIGH

## 2020-11-08 MED FILL — TIVICAY 50 MG TABLET: 50 | 30 days supply | Qty: 30 | Fill #1

## 2020-11-08 MED FILL — DESCOVY 200-25 MG TABS: 200-25 | 30 days supply | Qty: 30 | Fill #1

## 2020-12-06 ENCOUNTER — Telehealth: Payer: Self-pay

## 2020-12-06 DIAGNOSIS — B20 Human immunodeficiency virus [HIV] disease: Secondary | ICD-10-CM

## 2020-12-06 MED ORDER — TIVICAY 50 MG PO TABS: 50.0000 mg | ORAL_TABLET | Freq: Every day | ORAL | 1 refills | Status: DC

## 2020-12-06 MED ORDER — DESCOVY 200-25 MG PO TABS: 1.0000 | ORAL_TABLET | Freq: Every day | ORAL | 1 refills | Status: DC

## 2020-12-06 NOTE — Telephone Encounter (Signed)
-----   Message from Bobette Mo, CPhT sent at 12/06/2020  9:54 AM EST ----- Regarding: Tivicay & Descovy Hello Wallie Char,  [facility] [place] and can you let me know when so I can call her. [facility] can not fill her scripts anymore.      Thank You,  Clearance Coots, CPhT Specialty Pharmacy Patient [facility] Phone: [phone] Fax: [phone]

## 2020-12-06 NOTE — Telephone Encounter (Signed)
[facility] Patient Advocate Encounter  I have reached out to patient to let her know that [facility] can not fill her prescriptions any longer. [facility] and member can call [phone].   Clearance Coots, CPhT Specialty Pharmacy Patient [facility] Phone: [phone] Fax:  [phone]

## 2021-01-13 ENCOUNTER — Other Ambulatory Visit: Payer: Self-pay

## 2021-01-13 ENCOUNTER — Encounter: Payer: Self-pay | Admitting: Internal Medicine

## 2021-01-13 ENCOUNTER — Other Ambulatory Visit (HOSPITAL_COMMUNITY)
Admission: RE | Admit: 2021-01-13 | Payer: BC Managed Care – PPO | Source: Ambulatory Visit | Admitting: Internal Medicine

## 2021-01-13 ENCOUNTER — Ambulatory Visit (INDEPENDENT_AMBULATORY_CARE_PROVIDER_SITE_OTHER): Payer: 59 | Admitting: Internal Medicine

## 2021-01-13 VITALS — BP 139/86 | HR 71 | Temp 98.3°F | Wt 146.0 lb

## 2021-01-13 DIAGNOSIS — G479 Sleep disorder, unspecified: Secondary | ICD-10-CM | POA: Diagnosis not present

## 2021-01-13 DIAGNOSIS — Z79899 Other long term (current) drug therapy: Secondary | ICD-10-CM

## 2021-01-13 DIAGNOSIS — Z113 Encounter for screening for infections with a predominantly sexual mode of transmission: Secondary | ICD-10-CM

## 2021-01-13 DIAGNOSIS — B20 Human immunodeficiency virus [HIV] disease: Secondary | ICD-10-CM

## 2021-01-13 NOTE — Assessment & Plan Note (Signed)
Doing well and no changes.  Labs today and she can rtc in 6 months.

## 2021-01-13 NOTE — Assessment & Plan Note (Signed)
Will screen today 

## 2021-01-13 NOTE — Progress Notes (Signed)
   Subjective:    Patient ID: [name], female    DOB: 1971/05/20, 50 y.o.   MRN: [id number]  HPI Here for follow up of HIV She continues on Descovy and tivicay with no missed doses.  She was previously seen by my partner with concerns for tooth infection and now is resolved.  Also vertigo is resolved.  No new complaints.  She has had no issues getting or tolerating her medication.    Review of Systems  Constitutional: Negative for fatigue.  Gastrointestinal: Negative for diarrhea and nausea.  Skin: Negative for rash.  Neurological: Negative for light-headedness.       Objective:   Physical Exam Constitutional:      Appearance: Normal appearance.  Eyes:     General: No scleral icterus. Cardiovascular:     Rate and Rhythm: Normal rate and regular rhythm.     Heart sounds: No murmur heard.   Pulmonary:     Effort: Pulmonary effort is normal.  Neurological:     General: No focal deficit present.     Mental Status: She is alert.  Psychiatric:        Mood and Affect: Mood normal.   SH; no tobacco        Assessment & Plan:

## 2021-01-13 NOTE — Assessment & Plan Note (Signed)
Will check a lipid panel. 

## 2021-01-13 NOTE — Assessment & Plan Note (Signed)
Much improved now with resolution of the vertigo.

## 2021-01-14 LAB — URINE CYTOLOGY ANCILLARY ONLY
Chlamydia: NEGATIVE
Comment: NEGATIVE
Comment: NORMAL
Neisseria Gonorrhea: NEGATIVE

## 2021-01-14 LAB — T-HELPER CELL (CD4) - (RCID CLINIC ONLY)
CD4 % Helper T Cell: 19 % — ABNORMAL LOW (ref 33–65)
CD4 T Cell Abs: 391 /uL — ABNORMAL LOW (ref 400–1790)

## 2021-01-16 LAB — COMPLETE METABOLIC PANEL WITH GFR
AG Ratio: 1.4 (calc) (ref 1.0–2.5)
ALT: 18 U/L (ref 6–29)
AST: 20 U/L (ref 10–35)
Albumin: 4.6 g/dL (ref 3.6–5.1)
Alkaline phosphatase (APISO): 104 U/L (ref 31–125)
BUN/Creatinine Ratio: 14 (calc) (ref 6–22)
BUN: 16 mg/dL (ref 7–25)
CO2: 33 mmol/L — ABNORMAL HIGH (ref 20–32)
Calcium: 10.6 mg/dL — ABNORMAL HIGH (ref 8.6–10.2)
Chloride: 103 mmol/L (ref 98–110)
Creat: 1.16 mg/dL — ABNORMAL HIGH (ref 0.50–1.10)
GFR, Est African American: 64 mL/min/{1.73_m2} (ref >=60)
GFR, Est Non African American: 56 mL/min/{1.73_m2} — ABNORMAL LOW (ref >=60)
Globulin: 3.4 g/dL (calc) (ref 1.9–3.7)
Glucose, Bld: 103 mg/dL — ABNORMAL HIGH (ref 65–99)
Potassium: 3.9 mmol/L (ref 3.5–5.3)
Sodium: 142 mmol/L (ref 135–146)
Total Bilirubin: 0.4 mg/dL (ref 0.2–1.2)
Total Protein: 8 g/dL (ref 6.1–8.1)

## 2021-01-16 LAB — CBC WITH DIFFERENTIAL/PLATELET
Absolute Monocytes: 368 cells/uL (ref 200–950)
Basophils Absolute: 32 cells/uL (ref 0–200)
Basophils Relative: 0.7 %
Eosinophils Absolute: 221 cells/uL (ref 15–500)
Eosinophils Relative: 4.8 %
HCT: 43.8 % (ref 35.0–45.0)
Hemoglobin: 14.9 g/dL (ref 11.7–15.5)
Lymphs Abs: 2162 cells/uL (ref 850–3900)
MCH: 31.8 pg (ref 27.0–33.0)
MCHC: 34 g/dL (ref 32.0–36.0)
MCV: 93.6 fL (ref 80.0–100.0)
MPV: 12.1 fL (ref 7.5–12.5)
Monocytes Relative: 8 %
Neutro Abs: 1817 cells/uL (ref 1500–7800)
Neutrophils Relative %: 39.5 %
Platelets: 198 10*3/uL (ref 140–400)
RBC: 4.68 10*6/uL (ref 3.80–5.10)
RDW: 12.7 % (ref 11.0–15.0)
Total Lymphocyte: 47 %
WBC: 4.6 10*3/uL (ref 3.8–10.8)

## 2021-01-16 LAB — LIPID PANEL
Cholesterol: 275 mg/dL — ABNORMAL HIGH (ref <200)
HDL: 70 mg/dL (ref >=50)
LDL Cholesterol (Calc): 182 mg/dL (calc) — ABNORMAL HIGH
Non-HDL Cholesterol (Calc): 205 mg/dL (calc) — ABNORMAL HIGH (ref <130)
Total CHOL/HDL Ratio: 3.9 (calc) (ref <5.0)
Triglycerides: 106 mg/dL (ref <150)

## 2021-01-16 LAB — HIV-1 RNA QUANT-NO REFLEX-BLD
HIV 1 RNA Quant: NOT DETECTED Copies/mL
HIV-1 RNA Quant, Log: NOT DETECTED Log cps/mL

## 2021-01-16 LAB — RPR: RPR Ser Ql: NONREACTIVE

## 2021-01-17 ENCOUNTER — Encounter: Payer: Self-pay | Admitting: Internal Medicine

## 2021-01-22 ENCOUNTER — Other Ambulatory Visit: Payer: Self-pay | Admitting: Internal Medicine

## 2021-01-22 DIAGNOSIS — B20 Human immunodeficiency virus [HIV] disease: Secondary | ICD-10-CM

## 2021-01-24 ENCOUNTER — Other Ambulatory Visit (HOSPITAL_BASED_OUTPATIENT_CLINIC_OR_DEPARTMENT_OTHER): Payer: Self-pay

## 2021-04-14 ENCOUNTER — Other Ambulatory Visit: Payer: Self-pay | Admitting: Family

## 2021-04-14 DIAGNOSIS — B20 Human immunodeficiency virus [HIV] disease: Secondary | ICD-10-CM

## 2021-08-21 ENCOUNTER — Encounter: Payer: Self-pay | Admitting: Internal Medicine

## 2021-08-21 ENCOUNTER — Other Ambulatory Visit: Payer: Self-pay

## 2021-08-21 ENCOUNTER — Ambulatory Visit (INDEPENDENT_AMBULATORY_CARE_PROVIDER_SITE_OTHER): Payer: BC Managed Care – PPO | Admitting: Internal Medicine

## 2021-08-21 VITALS — BP 129/86 | HR 84 | Temp 98.3°F | Wt 152.8 lb

## 2021-08-21 DIAGNOSIS — G479 Sleep disorder, unspecified: Secondary | ICD-10-CM | POA: Diagnosis not present

## 2021-08-21 DIAGNOSIS — Z113 Encounter for screening for infections with a predominantly sexual mode of transmission: Secondary | ICD-10-CM

## 2021-08-21 DIAGNOSIS — Z23 Encounter for immunization: Secondary | ICD-10-CM | POA: Diagnosis not present

## 2021-08-21 DIAGNOSIS — B20 Human immunodeficiency virus [HIV] disease: Secondary | ICD-10-CM

## 2021-08-21 DIAGNOSIS — F5102 Adjustment insomnia: Secondary | ICD-10-CM

## 2021-08-21 MED ORDER — DESCOVY 200-25 MG PO TABS: ORAL_TABLET | ORAL | 11 refills | Status: DC

## 2021-08-21 MED ORDER — TRAZODONE HCL 100 MG PO TABS: 100.0000 mg | ORAL_TABLET | Freq: Every evening | ORAL | 11 refills | Status: DC | PRN

## 2021-08-21 MED ORDER — TIVICAY 50 MG PO TABS: ORAL_TABLET | ORAL | 11 refills | Status: DC

## 2021-08-21 NOTE — Progress Notes (Signed)
   Subjective:    Patient ID: [name], female    DOB: Jul 05, 1971, 50 y.o.   MRN: [id number]  HPI Here for follow up of HIV She continues on Tivicay and Descovy and no missed doses.  No issues with getting, taking or tolerating the medication.  Asking about dentistry for tooth implants.  No missed doses.     Review of Systems  Constitutional:  Negative for fatigue.  Gastrointestinal:  Negative for diarrhea and nausea.  Skin:  Negative for rash.  Neurological:  Negative for light-headedness.      Objective:   Physical Exam Eyes:     General: No scleral icterus. Pulmonary:     Effort: Pulmonary effort is normal.  Skin:    Findings: No rash.  Neurological:     General: No focal deficit present.     Mental Status: She is alert.  Psychiatric:        Mood and Affect: Mood normal.   SH: no tobacco        Assessment & Plan:

## 2021-08-21 NOTE — Assessment & Plan Note (Signed)
Discussed the flu shot and given today 

## 2021-08-21 NOTE — Assessment & Plan Note (Signed)
She continues to do well and no concerns.  No inidcation for medication change though can consider a one pill a day regimen such as Biktarvy at some point.  Labs today and rtc in 6 months.

## 2021-08-21 NOTE — Assessment & Plan Note (Signed)
Helped with trazodone and refilled today

## 2021-08-21 NOTE — Assessment & Plan Note (Addendum)
Screened negative last visit.

## 2021-08-22 LAB — T-HELPER CELL (CD4) - (RCID CLINIC ONLY)
CD4 % Helper T Cell: 19 % — ABNORMAL LOW (ref 33–65)
CD4 T Cell Abs: 468 /uL (ref 400–1790)

## 2021-08-25 ENCOUNTER — Other Ambulatory Visit: Payer: Self-pay | Admitting: Internal Medicine

## 2021-08-25 DIAGNOSIS — B20 Human immunodeficiency virus [HIV] disease: Secondary | ICD-10-CM

## 2021-08-25 LAB — HIV-1 RNA QUANT-NO REFLEX-BLD
HIV 1 RNA Quant: NOT DETECTED Copies/mL
HIV-1 RNA Quant, Log: NOT DETECTED Log cps/mL

## 2021-09-08 ENCOUNTER — Other Ambulatory Visit: Payer: Self-pay

## 2021-09-08 ENCOUNTER — Other Ambulatory Visit: Payer: Self-pay | Admitting: Internal Medicine

## 2021-09-08 DIAGNOSIS — B20 Human immunodeficiency virus [HIV] disease: Secondary | ICD-10-CM

## 2021-09-08 MED ORDER — DESCOVY 200-25 MG PO TABS: ORAL_TABLET | ORAL | 11 refills | Status: DC

## 2021-09-08 MED ORDER — TIVICAY 50 MG PO TABS: ORAL_TABLET | ORAL | 11 refills | Status: DC

## 2021-09-08 NOTE — Telephone Encounter (Signed)
Received faxed request that patient's Tivicay and [facility]. RN called [facility] to  cancel the prescriptions and resent to [facility].  Sandie Ano, RN

## 2021-10-22 ENCOUNTER — Other Ambulatory Visit: Payer: Self-pay | Admitting: Nurse Practitioner

## 2021-10-22 DIAGNOSIS — N6452 Nipple discharge: Secondary | ICD-10-CM

## 2021-12-02 ENCOUNTER — Other Ambulatory Visit: Payer: BC Managed Care – PPO

## 2021-12-24 ENCOUNTER — Ambulatory Visit
Admission: RE | Admit: 2021-12-24 | Discharge: 2021-12-24 | Disposition: A | Discharge status: Home / Self Care | Payer: BC Managed Care – PPO | Source: Ambulatory Visit | Attending: Nurse Practitioner | Admitting: Nurse Practitioner

## 2021-12-24 ENCOUNTER — Other Ambulatory Visit: Payer: Self-pay

## 2021-12-24 DIAGNOSIS — N6452 Nipple discharge: Secondary | ICD-10-CM

## 2021-12-24 IMAGING — MG DIGITAL DIAGNOSTIC BILAT W/ TOMO W/ CAD
5 of 10 series · 5 of 30 positions shown · non-contrast
Comparison: Previous exam(s).

CLINICAL DATA: 50-year-old female presenting for evaluation of 1
week of intermittent spontaneous left nipple discharge which she
describes as clear to white in color.

EXAM:
DIGITAL DIAGNOSTIC BILATERAL MAMMOGRAM WITH TOMOSYNTHESIS AND CAD;
ULTRASOUND LEFT BREAST LIMITED
TECHNIQUE: Bilateral digital diagnostic mammography and breast tomosynthesis
was performed. The images were evaluated with computer-aided
detection.; Targeted ultrasound examination of the left breast was
performed.

[R MLO synth-2D]
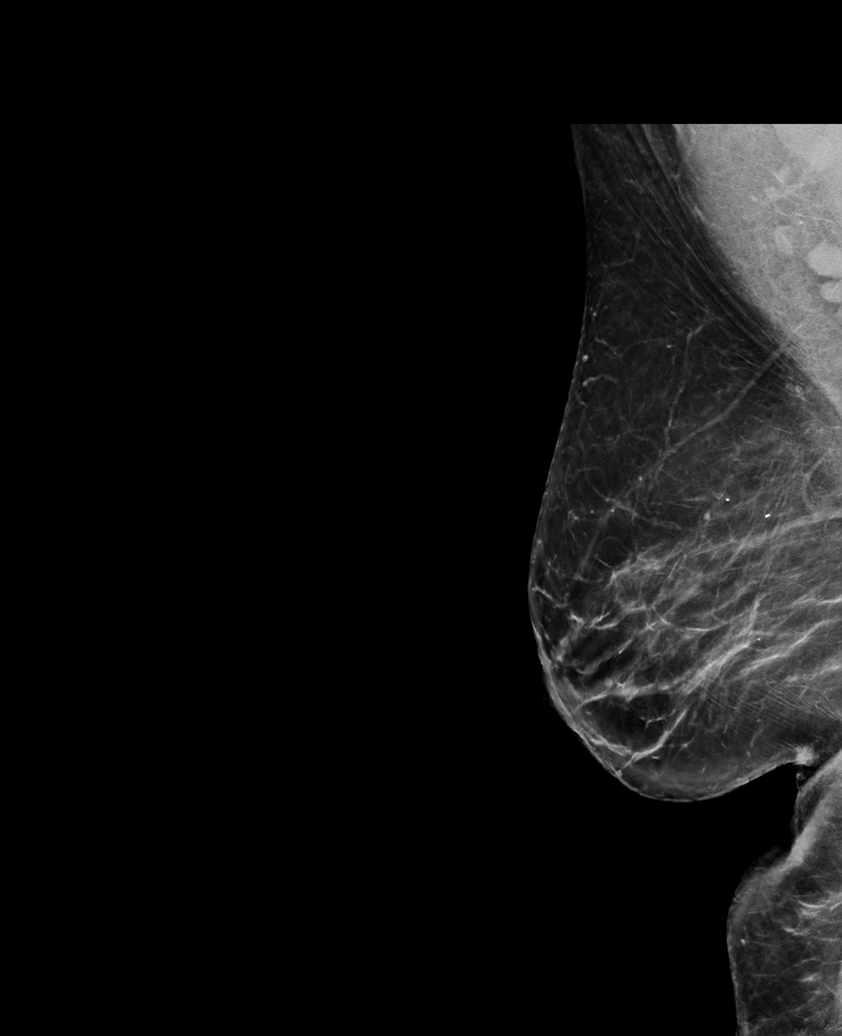

[R CC synth-2D]
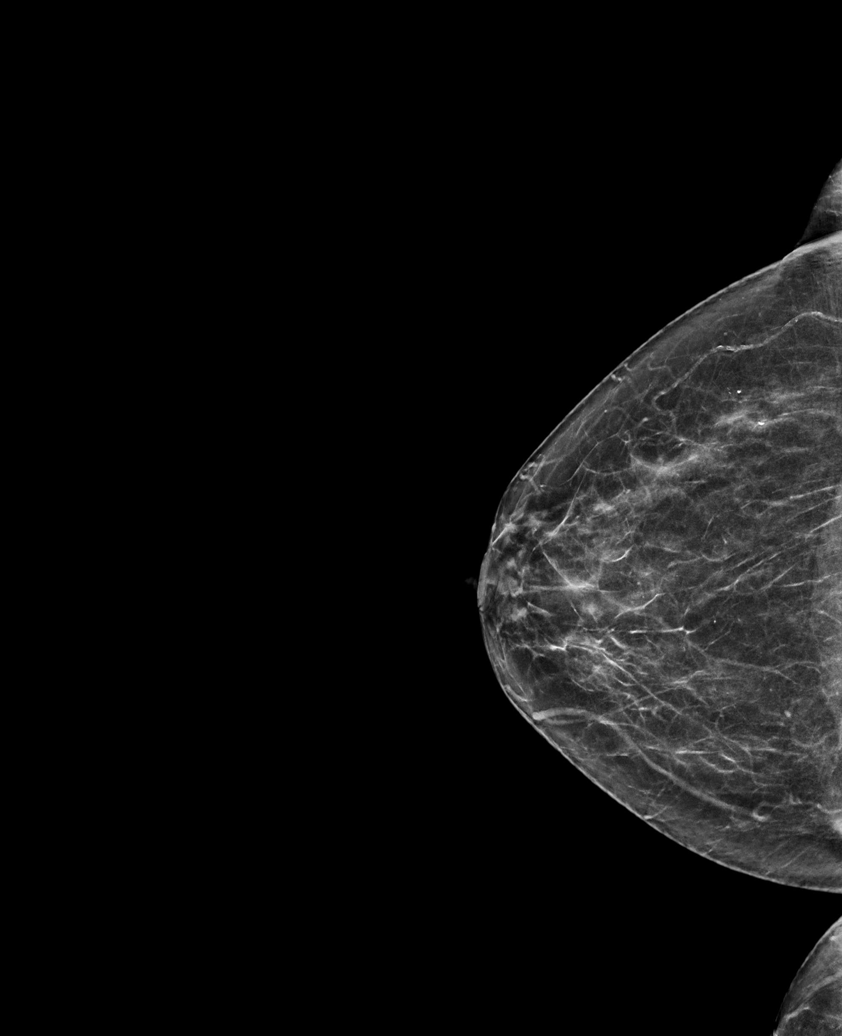

[L MLO synth-2D (1 of 2)]
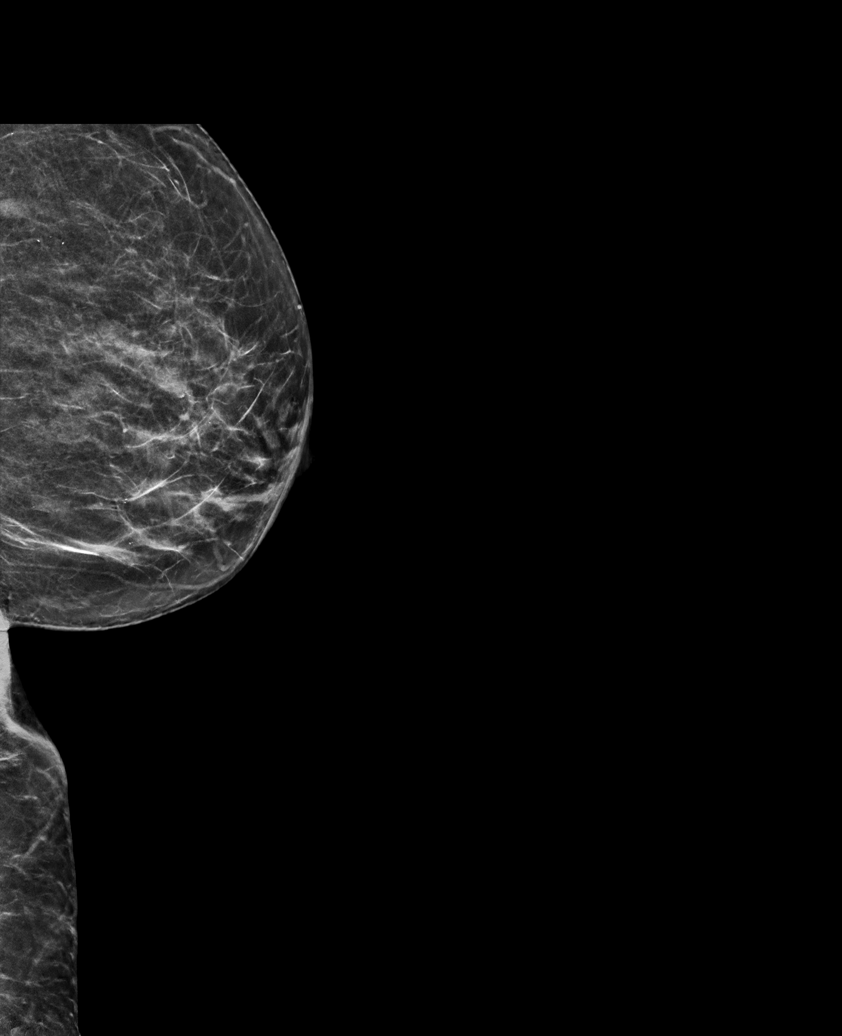

[L CC synth-2D]
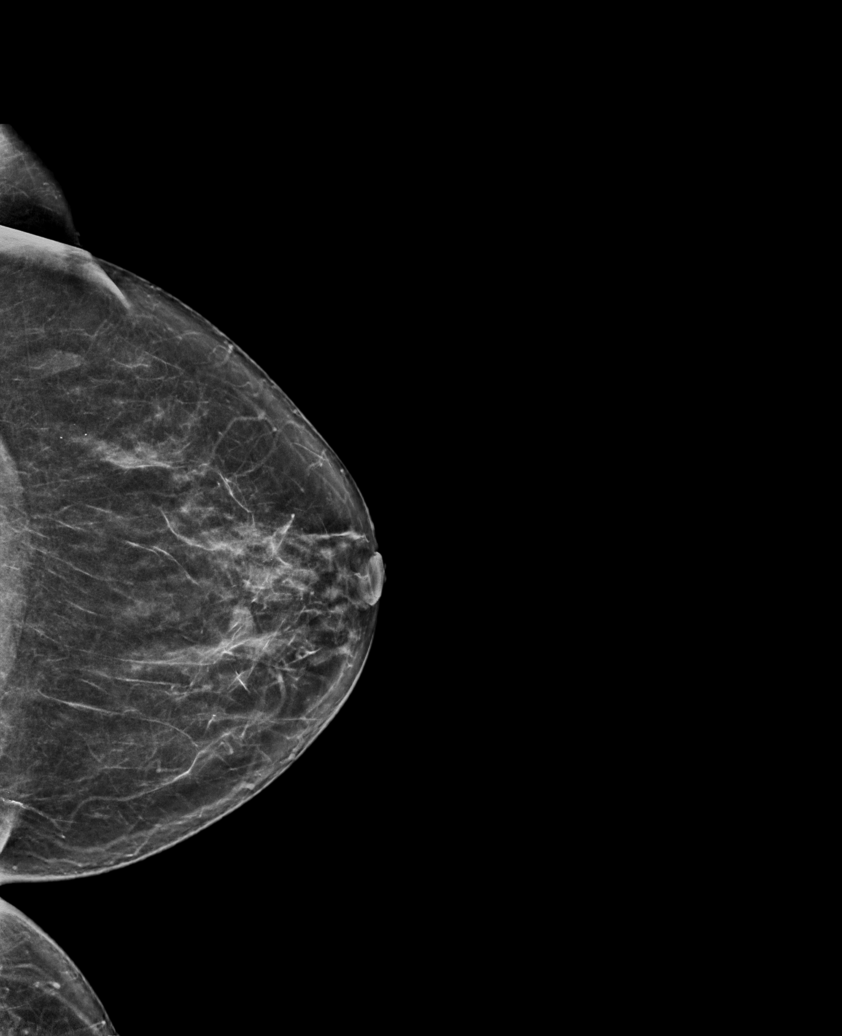

[L MLO synth-2D (2 of 2)]
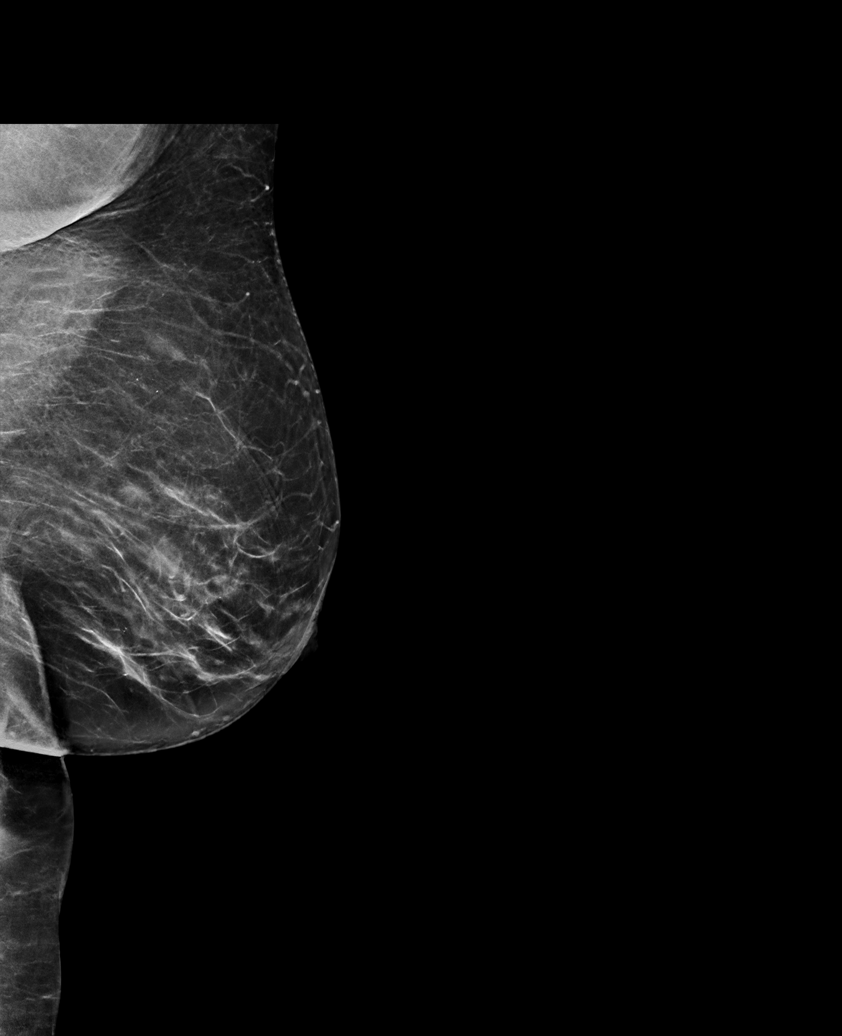

[5 of 30 positions shown; findings below may reference images not displayed]

ACR Breast Density Category c: The breast tissue is heterogeneously
dense, which may obscure small masses.
FINDINGS: In the slightly medial anterior left breast there is an oval
obscured mass measuring approximately 8 mm. In the lateral mid to
posterior depth of the left breast there is another oval mass
measuring approximately 8 mm. In the farther posterior upper outer
left breast there is a third low-density oval circumscribed mass
measuring approximately 1 cm. No suspicious calcifications, masses
or areas of distortion are seen in the right breast.

A small amount of yellow to clear discharge can be elicited from
multiple ducts in the left nipple. A tiny amount of discharge can be
manually expressed from a few ducts in the right breast as well.

Ultrasound of the left breast demonstrates numerous benign-appearing
cysts corresponding with the masses identified mammographically. One
is at 10 o'clock, 4 cm from the nipple measuring 1.7 cm. Another at
2 o'clock, 8 cm from the nipple measures 1.1 cm. Another at 2
o'clock, 3 cm from the nipple measures 0.5 cm.

Ultrasound of the retroareolar left breast demonstrates multiple
prominent ducts. No intraductal masses are identified.
IMPRESSION: 1. There are no suspicious mammographic or targeted sonographic
abnormalities in the retroareolar left breast to correspond with the
patient's left nipple discharge.

2. Fibrocystic changes noted sonographically throughout the left
breast. No mammographic evidence of malignancy in the bilateral
breasts.

RECOMMENDATION:
1. Further management of nipple discharge should be based on
clinical assessment. This is felt to be physiologic/related to a
benign process given that it is from multiple ducts and can be
elicited from both breasts. The patient should alert her doctor if
she experiences spontaneous unilateral bloody or clear nipple
discharge from a single duct only.

2.  Screening mammogram in one year.(Code:[3F])

I have discussed the findings and recommendations with the patient.
If applicable, a reminder letter will be sent to the patient
regarding the next appointment.

BI-RADS CATEGORY  2: Benign.

## 2022-04-19 ENCOUNTER — Encounter (HOSPITAL_COMMUNITY): Payer: Self-pay

## 2022-04-19 ENCOUNTER — Emergency Department (HOSPITAL_COMMUNITY): Payer: BC Managed Care – PPO

## 2022-04-19 ENCOUNTER — Other Ambulatory Visit: Payer: Self-pay

## 2022-04-19 ENCOUNTER — Emergency Department (HOSPITAL_COMMUNITY)
Admission: EM | Admit: 2022-04-19 | Discharge: 2022-04-19 | Disposition: A | Discharge status: Home / Self Care | Payer: BC Managed Care – PPO | Attending: Emergency Medicine | Admitting: Emergency Medicine

## 2022-04-19 DIAGNOSIS — R791 Abnormal coagulation profile: Secondary | ICD-10-CM | POA: Insufficient documentation

## 2022-04-19 DIAGNOSIS — R531 Weakness: Secondary | ICD-10-CM | POA: Diagnosis not present

## 2022-04-19 DIAGNOSIS — R059 Cough, unspecified: Secondary | ICD-10-CM | POA: Insufficient documentation

## 2022-04-19 DIAGNOSIS — R079 Chest pain, unspecified: Secondary | ICD-10-CM | POA: Insufficient documentation

## 2022-04-19 DIAGNOSIS — Z21 Asymptomatic human immunodeficiency virus [HIV] infection status: Secondary | ICD-10-CM | POA: Diagnosis not present

## 2022-04-19 DIAGNOSIS — R0602 Shortness of breath: Secondary | ICD-10-CM | POA: Insufficient documentation

## 2022-04-19 DIAGNOSIS — R0600 Dyspnea, unspecified: Secondary | ICD-10-CM | POA: Insufficient documentation

## 2022-04-19 LAB — COMPREHENSIVE METABOLIC PANEL
ALT: 20 U/L (ref 0–44)
AST: 41 U/L (ref 15–41)
Albumin: 4 g/dL (ref 3.5–5.0)
Alkaline Phosphatase: 66 U/L (ref 38–126)
Anion gap: 6 (ref 5–15)
BUN: 6 mg/dL (ref 6–20)
CO2: 24 mmol/L (ref 22–32)
Calcium: 9.7 mg/dL (ref 8.9–10.3)
Chloride: 109 mmol/L (ref 98–111)
Creatinine, Ser: 1.12 mg/dL — ABNORMAL HIGH (ref 0.44–1.00)
GFR, Estimated: >60 mL/min (ref >60)
Glucose, Bld: 93 mg/dL (ref 70–99)
Potassium: 3.9 mmol/L (ref 3.5–5.1)
Sodium: 139 mmol/L (ref 135–145)
Total Bilirubin: 0.5 mg/dL (ref 0.3–1.2)
Total Protein: 8.3 g/dL — ABNORMAL HIGH (ref 6.5–8.1)

## 2022-04-19 LAB — TROPONIN I (HIGH SENSITIVITY)
Troponin I (High Sensitivity): 3 ng/L (ref <18)
Troponin I (High Sensitivity): 3 ng/L (ref <18)

## 2022-04-19 LAB — D-DIMER, QUANTITATIVE: D-Dimer, Quant: 1.19 ug/mL-FEU — ABNORMAL HIGH (ref 0.00–0.50)

## 2022-04-19 LAB — CBC WITH DIFFERENTIAL/PLATELET
Abs Immature Granulocytes: 0.01 10*3/uL (ref 0.00–0.07)
Basophils Absolute: 0 10*3/uL (ref 0.0–0.1)
Basophils Relative: 1 %
Eosinophils Absolute: 0.1 10*3/uL (ref 0.0–0.5)
Eosinophils Relative: 1 %
HCT: 42.3 % (ref 36.0–46.0)
Hemoglobin: 14.2 g/dL (ref 12.0–15.0)
Immature Granulocytes: 0 %
Lymphocytes Relative: 64 %
Lymphs Abs: 2.5 10*3/uL (ref 0.7–4.0)
MCH: 30 pg (ref 26.0–34.0)
MCHC: 33.6 g/dL (ref 30.0–36.0)
MCV: 89.2 fL (ref 80.0–100.0)
Monocytes Absolute: 0.3 10*3/uL (ref 0.1–1.0)
Monocytes Relative: 7 %
Neutro Abs: 1.1 10*3/uL — ABNORMAL LOW (ref 1.7–7.7)
Neutrophils Relative %: 27 %
Platelets: 132 10*3/uL — ABNORMAL LOW (ref 150–400)
RBC: 4.74 MIL/uL (ref 3.87–5.11)
RDW: 13.2 % (ref 11.5–15.5)
WBC: 3.9 10*3/uL — ABNORMAL LOW (ref 4.0–10.5)
nRBC: 0 % (ref 0.0–0.2)

## 2022-04-19 LAB — I-STAT BETA HCG BLOOD, ED (MC, WL, AP ONLY): I-stat hCG, quantitative: <5 m[IU]/mL (ref <5)

## 2022-04-19 MED ORDER — SODIUM CHLORIDE (PF) 0.9 % IJ SOLN
INTRAMUSCULAR | Status: AC
  Filled 2022-04-19: qty 50

## 2022-04-19 MED ORDER — IOHEXOL 350 MG/ML SOLN
80.0000 mL | Freq: Once | INTRAVENOUS | Status: AC | PRN
  Administered 2022-04-19: 80 mL via INTRAVENOUS

## 2022-04-19 NOTE — ED Notes (Signed)
Patient transported to CT 

## 2022-04-19 NOTE — ED Provider Notes (Signed)
[facility]-EMERGENCY DEPT Provider Note   CSN: [id number] Arrival date & time: 04/19/22  4166     History {Add pertinent medical, surgical, social history, OB history to HPI:1} Chief Complaint  Patient presents with   Cough   Chest Pain   Weakness    [name] is a 51 y.o. female.  HPI Patient presenting for evaluation of cough with fever for 2 weeks.  She arrived back [place] the country from travel [place], 3 days ago.  She is taking her usual medications.    Home Medications Prior to Admission medications   Medication Sig Start Date End Date Taking? Authorizing Provider  dolutegravir (TIVICAY) 50 MG tablet TAKE ONE TABLET BY MOUTH ONCE DAILY. STORE AT ROOM TEMPERATURE. 09/08/21   Gardiner Barefoot, MD  emtricitabine-tenofovir AF (DESCOVY) 200-25 MG tablet TAKE ONE TABLET BY MOUTH ONCE DAILY WITH OR WITHOUT FOOD. 09/08/21   Gardiner Barefoot, MD  escitalopram (LEXAPRO) 20 MG tablet TAKE 1 TABLET BY MOUTH ONCE DAILY 07/16/20 07/16/21  Lorenda Ishihara, MD  ibuprofen (ADVIL) 200 MG tablet Take 1 tablet (200 mg total) by mouth every 6 (six) hours as needed. 10/11/20   Odette Fraction, MD  traZODone (DESYREL) 100 MG tablet Take 1 tablet (100 mg total) by mouth at bedtime as needed for sleep. 08/21/21   Comer, Belia Heman, MD      Allergies    Patient has no known allergies.    Review of Systems   Review of Systems  Physical Exam Updated Vital Signs BP 109/78 (BP Location: Right Arm)   Pulse 90   Temp 98.2 F (36.8 C) (Oral)   Resp 11   Ht 5\' 3"  (1.6 m)   Wt 63.5 kg   LMP 10/03/2019   SpO2 97%   BMI 24.80 kg/m  Physical Exam  ED Results / Procedures / Treatments   Labs (all labs ordered are listed, but only abnormal results are displayed) Labs Reviewed  COMPREHENSIVE METABOLIC PANEL - Abnormal; Notable for the following components:      Result Value   Creatinine, Ser 1.12 (*)    Total Protein 8.3 (*)    All other components within normal  limits  CBC WITH DIFFERENTIAL/PLATELET - Abnormal; Notable for the following components:   WBC 3.9 (*)    Platelets 132 (*)    Neutro Abs 1.1 (*)    All other components within normal limits  D-DIMER, QUANTITATIVE - Abnormal; Notable for the following components:   D-Dimer, Quant 1.19 (*)    All other components within normal limits  PATHOLOGIST SMEAR REVIEW  I-STAT BETA HCG BLOOD, ED ([facility], [facility], [facility] ONLY)  TROPONIN I (HIGH SENSITIVITY)  TROPONIN I (HIGH SENSITIVITY)    EKG EKG Interpretation  Date/Time:  Sunday April 19 2022 07:50:12 EDT Ventricular Rate:  95 PR Interval:  129 QRS Duration: 55 QT Interval:  372 QTC Calculation: 468 R Axis:   94 Text Interpretation: Sinus rhythm Right atrial enlargement Anteroseptal infarct, age indeterminate Confirmed by 02[birth year]1974 [phone]) on 04/19/2022 8:03:04 AM  Radiology DG Chest 2 View  Result Date: 04/19/2022 CLINICAL DATA:  51 year old female with history of chest pain and productive cough for the past 3 weeks. EXAM: CHEST - 2 VIEW COMPARISON:  Chest x-ray 02/04/2014. FINDINGS: Lung volumes are normal. No consolidative airspace disease. Trace bilateral pleural effusions. No pneumothorax. No pulmonary nodule or mass noted. Pulmonary vasculature and the cardiomediastinal silhouette are within normal limits. IMPRESSION: 1. Trace bilateral pleural effusions. Electronically Signed  By: Trudie Reed M.D.   On: 04/19/2022 08:12    Procedures Procedures  {Document cardiac monitor, telemetry assessment procedure when appropriate:1}  Medications Ordered in ED Medications - No data to display  ED Course/ Medical Decision Making/ A&P                           Medical Decision Making Amount and/or Complexity of Data Reviewed Labs: ordered. Radiology: ordered.   ***  {Document critical care time when appropriate:1} {Document review of labs and clinical decision tools ie heart score, Chads2Vasc2 etc:1}  {Document your independent  review of radiology images, and any outside records:1} {Document your discussion with family members, caretakers, and with consultants:1} {Document social determinants of health affecting pt's care:1} {Document your decision making why or why not admission, treatments were needed:1} Final Clinical Impression(s) / ED Diagnoses Final diagnoses:  None    Rx / DC Orders ED Discharge Orders     None

## 2022-04-19 NOTE — ED Provider Triage Note (Signed)
Emergency Medicine Provider Triage Evaluation Note  [name] , a 50 y.o. female  was evaluated in triage.  Pt complains of cough, nonproductive with fever for 2 weeks.  She [place] the country [place] travel, 3 days ago.  She continues to take her usual medications for HIV.  Review of Systems  Positive: Cough Negative: No sputum production  Physical Exam  BP 109/78 (BP Location: Right Arm)   Pulse 90   Temp 98.2 F (36.8 C) (Oral)   Resp 11   Ht 5\' 3"  (1.6 m)   Wt 63.5 kg   LMP 10/03/2019   SpO2 97%   BMI 24.80 kg/m  Gen:   Awake, no distress   Resp:  Normal effort good air movement bilaterally without audible wheezes or rales MSK:   Moves extremities without difficulty  Other:  Nontoxic appearance  Medical Decision Making  Medically screening exam initiated at 8:07 AM.  Appropriate orders placed.  [name] was informed that the remainder of the evaluation will be completed by another provider, this initial triage assessment does not replace that evaluation, and the importance of remaining in the ED until their evaluation is complete.  Evaluate for respiratory problems in light of known HIV disease and recent travel.   14/11/2018, MD 04/19/22 [phone]

## 2022-04-19 NOTE — Discharge Instructions (Signed)
The testing today does not show any serious problems.  There were some lymph nodes in your axillary areas, on the CT, of unclear cause.  Consider talking to your HIV doctor about this finding.  Make sure you are getting plenty rest, drink a lot of fluids and try to eat 3 meals a day.  Follow-up with your medical doctor for checkup in 3 to 4 days.

## 2022-04-19 NOTE — ED Triage Notes (Signed)
Patient c/o a non productive cough x 3 weeks, constant chest pain x 2 weeks, and weakness.

## 2022-04-21 LAB — PATHOLOGIST SMEAR REVIEW

## 2022-04-24 ENCOUNTER — Encounter: Payer: Self-pay | Admitting: Internal Medicine

## 2022-04-24 ENCOUNTER — Other Ambulatory Visit: Payer: Self-pay

## 2022-04-24 ENCOUNTER — Ambulatory Visit (INDEPENDENT_AMBULATORY_CARE_PROVIDER_SITE_OTHER): Payer: BC Managed Care – PPO | Admitting: Internal Medicine

## 2022-04-24 VITALS — BP 103/71 | HR 96 | Temp 98.2°F | Wt 147.0 lb

## 2022-04-24 DIAGNOSIS — Z79899 Other long term (current) drug therapy: Secondary | ICD-10-CM | POA: Diagnosis not present

## 2022-04-24 DIAGNOSIS — B20 Human immunodeficiency virus [HIV] disease: Secondary | ICD-10-CM | POA: Diagnosis not present

## 2022-04-24 DIAGNOSIS — B349 Viral infection, unspecified: Secondary | ICD-10-CM | POA: Diagnosis not present

## 2022-04-24 MED ORDER — ONDANSETRON 4 MG PO TBDP: 4.0000 mg | ORAL_TABLET | Freq: Three times a day (TID) | ORAL | 0 refills | Status: DC | PRN

## 2022-04-24 MED ORDER — DESCOVY 200-25 MG PO TABS: ORAL_TABLET | ORAL | 11 refills | Status: DC

## 2022-04-24 MED ORDER — TIVICAY 50 MG PO TABS: ORAL_TABLET | ORAL | 11 refills | Status: DC

## 2022-04-29 ENCOUNTER — Telehealth: Payer: Self-pay

## 2022-04-29 NOTE — Telephone Encounter (Signed)
-----   Message from Gardiner Barefoot, MD sent at 04/29/2022 11:38 AM EDT ----- Can you please add on a genotype, find out if she is taking her medication (she said she was...) and have her come back to see pharmacy or someone in a week or two.  thanks

## 2022-04-29 NOTE — Telephone Encounter (Signed)
Left voicemail asking patient to return my call.   [name] has contacted [facility] to add on Genotype - lab will call or email if not enough plasma to process order.    Haylin Camilli Lesli Albee, CMA

## 2022-04-30 NOTE — Telephone Encounter (Signed)
Patient's spouse called office regarding voicemail left yesterday. Was able to schedule appointment with pharmacy team in two weeks to follow up on results.  Juanita Laster, RMA

## 2022-05-14 ENCOUNTER — Ambulatory Visit (INDEPENDENT_AMBULATORY_CARE_PROVIDER_SITE_OTHER): Payer: BC Managed Care – PPO | Admitting: Pharmacist

## 2022-05-14 ENCOUNTER — Other Ambulatory Visit: Payer: Self-pay

## 2022-05-14 DIAGNOSIS — B20 Human immunodeficiency virus [HIV] disease: Secondary | ICD-10-CM

## 2022-05-14 NOTE — Progress Notes (Signed)
HPI: [name] is a 51 y.o. female who presents to the [facility] pharmacy clinic for HIV follow-up.  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 STORE AT ROOM TEMPERATURE.   EMTRICITABINE-TENOFOVIR AF (DESCOVY) 200-25 MG TABLET    TAKE ONE TABLET BY MOUTH ONCE DAILY WITH OR WITHOUT FOOD.   ESCITALOPRAM (LEXAPRO) 20 MG TABLET    TAKE 1 TABLET BY MOUTH ONCE DAILY   IBUPROFEN (ADVIL) 200 MG TABLET    Take 1 tablet (200 mg total) by mouth every 6 (six) hours as needed.   ONDANSETRON (ZOFRAN-ODT) 4 MG DISINTEGRATING TABLET    Take 1 tablet (4 mg total) by mouth every 8 (eight) hours as needed for nausea or vomiting.   TRAZODONE (DESYREL) 100 MG TABLET    Take 1 tablet (100 mg total) by mouth at bedtime as needed for sleep.  Modified Medications   No medications on file  Discontinued Medications   No medications on file    Allergies: No Known Allergies  Past Medical History: Past Medical History:  Diagnosis Date   Abnormal Pap smear    Back pain    Depression    HIV disease (HCC)     Social History: Social History   Socioeconomic History   Marital status:  Married    Spouse name: Not on file   Number of children: Not on file   Years of education: Not on file   Highest education level: Not on file  Occupational History   Not on file  Tobacco Use   Smoking status: Never   Smokeless tobacco: Never  Vaping Use   Vaping Use: Never used  Substance and Sexual Activity   Alcohol use: No   Drug use: No   Sexual activity: Yes    Partners: Male    Birth control/protection: None    Comment: pt given condoms  Other Topics Concern   Not on file  Social History Narrative   Not on file   Social Determinants of Health   Financial Resource Strain: Not on file  Food Insecurity: Not on file  Transportation Needs: Not on file  Physical Activity: Not on file  Stress: Not on file  Social Connections: Not on file    Labs: Lab Results  Component Value Date   HIV1RNAQUANT 6,630 (H) 04/24/2022   HIV1RNAQUANT Not Detected 08/21/2021   HIV1RNAQUANT Not Detected 01/13/2021   CD4TABS 468 08/21/2021   CD4TABS 391 (L) 01/13/2021   CD4TABS 391 (L) 06/05/2020    RPR and STI Lab Results  Component Value Date   LABRPR NON-REACTIVE 01/13/2021   LABRPR NON-REACTIVE 11/09/2019   LABRPR NON-REACTIVE 12/12/2018   LABRPR NON-REACTIVE 01/25/2018   LABRPR  NON REAC 02/24/2017    STI Results GC CT  01/13/2021  3:03 PM Negative  Negative   11/09/2019  9:52 AM Negative  Negative   12/12/2018 12:00 AM Negative  Negative   07/27/2017 12:00 AM Negative  Negative   02/24/2017 12:00 AM Negative  Negative   09/12/2015 12:00 AM Negative  Negative   04/28/2012  4:09 PM  NEGATIVE     Hepatitis B Lab Results  Component Value Date   HEPBSAB NEG 06/05/2011   HEPBSAG NEGATIVE 06/05/2011   HEPBCAB POS (A) 10/07/2009   Hepatitis C No results found for: "HEPCAB", "HCVRNAPCRQN" Hepatitis A Lab Results  Component Value Date   HAV POS (A) 06/05/2011   Lipids: Lab Results  Component Value Date   CHOL 228 (H) 04/24/2022   TRIG 137 04/24/2022   HDL 36 (L)  04/24/2022   CHOLHDL 6.3 (H) 04/24/2022   VLDL 21 02/24/2017   LDLCALC 165 (H) 04/24/2022    Current HIV Regimen: Tivicay/Descovy  Assessment: [name] presents to clinic today for HIV follow-up. She has remained undetectable for over a decade and has been perfectly controlled on her current regimen of Tivicay + Descovy until her last visit with Dr. Luciana Axe when her HIV RNA resulted at over 6,000. Patient denies missing any tablets, denies any vomiting, and does not display any drug interactions. Dr. Luciana Axe ordered an HIV genotype which is pending. In the meantime, will recheck her viral load today and assess if this was a lab error. If there was not a lab error, there would be concern for likely resistance despite her perfect adherence.   She also complains of multiple symptoms today which she describes as "pain that starts in my chest and moves to my stomach then to my back and then to my legs". She complains of fatigue, nausea, and weakness. Denies experiencing any acid reflux. She recently traveled to [place] for a month (unsure of which region) and returned last month around this time. She states that since her return, she also experienced intense fever and chills which have seemed to improve. She went to the ED on 6/18 complaining of cough and dyspnea which have since improved. Work up for PE and acute respiratory illness was negative. Blood smear was collected and was negative.   [name] does not recall if she took malaria prophylaxis for her trip, and there is no evidence from her fill history to suggest she did. Again, unsure of region she traveled to. She stated she began experiencing some of her symptoms while she was in [place] and that her family and friends stated they thought she had rheumatoid arthritis. Will have her follow-up with Dr. Drue Second next week for further work-up and assessment. In the meantime, would like her to continue Tivicay/Descovy and wait for lab results prior to making any  changes. If work up returns negative without resistance and with a normal viral load, I would suggest transitioning her to a one pill once daily regimen such as Biktarvy.  Plan: Continue Tivicay/Descovy Check HIV RNA Follow-up on pending HIV genotype Follow-up with Dr. Drue Second on 05/19/22  Margarite Gouge, PharmD, CPP, BCIDP Clinical Pharmacist Practitioner Infectious Diseases Clinical Pharmacist [facility] 05/14/2022, 12:07 PM

## 2022-05-15 LAB — T-HELPER CELLS (CD4) COUNT (NOT AT ARMC)
Absolute CD4: 280 cells/uL — ABNORMAL LOW (ref 490–1740)
CD4 T Helper %: 14 % — ABNORMAL LOW (ref 30–61)
Total lymphocyte count: 1952 cells/uL (ref 850–3900)

## 2022-05-15 LAB — TEST AUTHORIZATION

## 2022-05-15 LAB — LIPID PANEL
Cholesterol: 228 mg/dL — ABNORMAL HIGH (ref <200)
HDL: 36 mg/dL — ABNORMAL LOW (ref >=50)
LDL Cholesterol (Calc): 165 mg/dL (calc) — ABNORMAL HIGH
Non-HDL Cholesterol (Calc): 192 mg/dL (calc) — ABNORMAL HIGH (ref <130)
Total CHOL/HDL Ratio: 6.3 (calc) — ABNORMAL HIGH (ref <5.0)
Triglycerides: 137 mg/dL (ref <150)

## 2022-05-15 LAB — HIV-1 RNA QUANT-NO REFLEX-BLD
HIV 1 RNA Quant: 6630 Copies/mL — ABNORMAL HIGH
HIV-1 RNA Quant, Log: 3.82 Log cps/mL — ABNORMAL HIGH

## 2022-05-15 LAB — HIV-1 GENOTYPE: HIV-1 Genotype: DETECTED — AB

## 2022-05-18 LAB — HIV-1 RNA QUANT-NO REFLEX-BLD
HIV 1 RNA Quant: 272 Copies/mL — ABNORMAL HIGH
HIV-1 RNA Quant, Log: 2.43 Log cps/mL — ABNORMAL HIGH

## 2022-05-19 ENCOUNTER — Ambulatory Visit (INDEPENDENT_AMBULATORY_CARE_PROVIDER_SITE_OTHER): Payer: BC Managed Care – PPO | Admitting: Internal Medicine

## 2022-05-19 ENCOUNTER — Encounter: Payer: Self-pay | Admitting: Internal Medicine

## 2022-05-19 ENCOUNTER — Other Ambulatory Visit: Payer: Self-pay

## 2022-05-19 VITALS — BP 125/83 | HR 73 | Temp 98.8°F | Wt 144.0 lb

## 2022-05-19 DIAGNOSIS — B20 Human immunodeficiency virus [HIV] disease: Secondary | ICD-10-CM

## 2022-05-19 DIAGNOSIS — T148XXA Other injury of unspecified body region, initial encounter: Secondary | ICD-10-CM | POA: Diagnosis not present

## 2022-05-19 DIAGNOSIS — F5102 Adjustment insomnia: Secondary | ICD-10-CM | POA: Diagnosis not present

## 2022-05-19 MED ORDER — METHOCARBAMOL 500 MG PO TABS
500.0000 mg | ORAL_TABLET | Freq: Three times a day (TID) | ORAL | 0 refills | Status: AC | PRN
Start: 2022-05-19 — End: ?

## 2022-05-19 MED ORDER — TRAZODONE HCL 50 MG PO TABS: 25.0000 mg | ORAL_TABLET | Freq: Every day | ORAL | 3 refills | Status: DC

## 2022-05-19 NOTE — Progress Notes (Signed)
RFV: follow up for HIV disease  Patient ID: [name], female   DOB: Nov 16, 1970, 51 y.o.   MRN: [id number]  HPI 51yo F with hiv disease, CD 4 count of 280/VL 272 on tivicay and descovy. Takes it every morning. No side effects. Usually for breakfast, bread egg, rice. Sometimes coffee/ginger tea. No ant-acids or vitamins or iron or grapefruit juice.   Was in [place] for 6 wk visiting family.  HIV Genotype showed. These were ran against [facility] hiv database and didn't find any significnat mutation. OTHER MUTATIONS DETECTED:  RT GENE MUTATIONS: [phone]  PR GENE MUTATIONS: I13V,K20I,M36I,H69K,L89M   Outpatient Encounter Medications as of 05/19/2022  Medication Sig   dolutegravir (TIVICAY) 50 MG tablet TAKE ONE TABLET BY MOUTH ONCE DAILY. STORE AT ROOM TEMPERATURE.   emtricitabine-tenofovir AF (DESCOVY) 200-25 MG tablet TAKE ONE TABLET BY MOUTH ONCE DAILY WITH OR WITHOUT FOOD.   ibuprofen (ADVIL) 200 MG tablet Take 1 tablet (200 mg total) by mouth every 6 (six) hours as needed.   ondansetron (ZOFRAN-ODT) 4 MG disintegrating tablet Take 1 tablet (4 mg total) by mouth every 8 (eight) hours as needed for nausea or vomiting.   traZODone (DESYREL) 100 MG tablet Take 1 tablet (100 mg total) by mouth at bedtime as needed for sleep.   escitalopram (LEXAPRO) 20 MG tablet TAKE 1 TABLET BY MOUTH ONCE DAILY   No facility-administered encounter medications on file as of 05/19/2022.     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   Seizure (HCC) 02/04/2014   Poor appetite 10/03/2013   Dysmenorrhea 12/29/2012   Constipation 11/22/2012    LSIL (low grade squamous intraepithelial lesion) on Pap smear 04/07/2012   Depressed 04/01/2012   Sleep difficulties 11/26/2011   HIV (human immunodeficiency virus infection) (HCC) 10/07/2009     Health Maintenance Due  Topic Date Due   FOOT EXAM  Never done   OPHTHALMOLOGY EXAM  Never done   URINE MICROALBUMIN  Never done   HEMOGLOBIN A1C  06/13/2014   COLONOSCOPY (Pts 45-70yrs Insurance coverage will need to be confirmed)  Never done   COVID-19 Vaccine (4 - Booster for Pfizer series) 12/06/2020   PAP SMEAR-Modifier  05/02/2021     Review of Systems Review of Systems  Constitutional: Negative for fever, chills, diaphoresis, activity change, appetite change, fatigue and unexpected weight change.  HENT: Negative for congestion, sore throat, rhinorrhea, sneezing, trouble swallowing and sinus pressure.  Eyes: Negative for photophobia and visual disturbance.  Respiratory: Negative for cough, chest tightness, shortness of breath, wheezing and stridor.  Cardiovascular: Negative for chest pain, palpitations and leg swelling.  Gastrointestinal: Negative for nausea, vomiting, abdominal pain, diarrhea, constipation, blood in stool, abdominal distention and anal bleeding.  Genitourinary: Negative for dysuria, hematuria, flank pain and difficulty urinating.  Musculoskeletal: Negative for myalgias, back pain, joint swelling, arthralgias and gait problem.  Skin: Negative for color change, pallor, rash and wound.  Neurological: Negative for dizziness, tremors, weakness and light-headedness.  Hematological: Negative for adenopathy. Does not bruise/bleed easily.  Psychiatric/Behavioral: Negative for behavioral problems, confusion, sleep disturbance, dysphoric mood, decreased concentration and agitation.   Physical Exam   BP 125/83   Pulse  73   Temp 98.8 F (37.1 C) (Oral)   Wt 144 lb (65.3 kg)   LMP 10/03/2019   BMI 25.51 kg/m   Physical Exam  Constitutional:  oriented to person, place,  and time. appears well-developed and well-nourished. No distress.  HENT: [place]/AT, PERRLA, no scleral icterus Mouth/Throat: Oropharynx is clear and moist. No oropharyngeal exudate.  Cardiovascular: Normal rate, regular rhythm and normal heart sounds. Exam reveals no gallop and no friction rub.  No murmur heard.  Pulmonary/Chest: Effort normal and breath sounds normal. No respiratory distress.  has no wheezes.  Neck = supple, no nuchal rigidity Abdominal: Soft. Bowel sounds are normal.  exhibits no distension. There is no tenderness.  Lymphadenopathy: no cervical adenopathy. No axillary adenopathy Neurological: alert and oriented to person, place, and time.  Skin: Skin is warm and dry. No rash noted. No erythema.  Psychiatric: a normal mood and affect.  behavior is normal.   Lab Results  Component Value Date   CD4TCELL 14 (L) 04/24/2022   Lab Results  Component Value Date   CD4TABS 468 08/21/2021   CD4TABS 391 (L) 01/13/2021   CD4TABS 391 (L) 06/05/2020   Lab Results  Component Value Date   HIV1RNAQUANT 272 (H) 05/14/2022   Lab Results  Component Value Date   HEPBSAB NEG 06/05/2011   Lab Results  Component Value Date   LABRPR NON-REACTIVE 01/13/2021    CBC Lab Results  Component Value Date   WBC 3.9 (L) 04/19/2022   RBC 4.74 04/19/2022   HGB 14.2 04/19/2022   HCT 42.3 04/19/2022   PLT 132 (L) 04/19/2022   MCV 89.2 04/19/2022   MCH 30.0 04/19/2022   MCHC 33.6 04/19/2022   RDW 13.2 04/19/2022   LYMPHSABS 2.5 04/19/2022   MONOABS 0.3 04/19/2022   EOSABS 0.1 04/19/2022    BMET Lab Results  Component Value Date   Bullock 139 04/19/2022   K 3.9 04/19/2022   CL 109 04/19/2022   CO2 24 04/19/2022   GLUCOSE 93 04/19/2022   BUN 6 04/19/2022   CREATININE 1.12 (H) 04/19/2022   CALCIUM 9.7 04/19/2022   GFRNONAA >60 04/19/2022   GFRAA 64 01/13/2021      Assessment and Plan  Hiv disease = continue on tivicay nad descovy and repeat vl in 4 wk. Unclear why she had high  VL blip. Her recent VL this past week is improved down to 272 but not undetectable. she Has good adherence. Plan to continue on her current regimen. If still still not undetectable in 1 month, consider changing to PI based regimen such as symtuza  LBP/muscle strain = will give trial of robaxin, reassess in 4 wks if still issue  Insomnia = trazodone 25mg  for sleep  See back in 4-5 wk with myself or dr comer.

## 2022-06-23 ENCOUNTER — Encounter: Payer: Self-pay | Admitting: Internal Medicine

## 2022-06-23 ENCOUNTER — Other Ambulatory Visit: Payer: Self-pay

## 2022-06-23 ENCOUNTER — Ambulatory Visit (INDEPENDENT_AMBULATORY_CARE_PROVIDER_SITE_OTHER): Payer: BC Managed Care – PPO | Admitting: Internal Medicine

## 2022-06-23 VITALS — BP 111/71 | HR 84 | Temp 98.0°F | Ht 63.0 in | Wt 145.0 lb

## 2022-06-23 DIAGNOSIS — Z79899 Other long term (current) drug therapy: Secondary | ICD-10-CM

## 2022-06-23 DIAGNOSIS — B20 Human immunodeficiency virus [HIV] disease: Secondary | ICD-10-CM | POA: Diagnosis not present

## 2022-06-23 MED ORDER — SYMTUZA 800-150-200-10 MG PO TABS
1.0000 | ORAL_TABLET | Freq: Every day | ORAL | 11 refills | Status: DC
Start: 2022-06-23 — End: 2022-07-21

## 2022-06-23 MED ORDER — TIVICAY 50 MG PO TABS
50.0000 mg | ORAL_TABLET | Freq: Every day | ORAL | 11 refills | Status: DC
Start: 2022-06-23 — End: 2022-07-21

## 2022-06-23 NOTE — Progress Notes (Signed)
HIV disease  Patient ID: [name], female   DOB: 12-27-1970, 51 y.o.   MRN: [id number]  HPI On tivicay/descovy but having some low level viremia that is new, unclear. Since she didn't miss doses. Will plan to switch to symtuza. She reports doing well otherwise with her health. Keeps busy with work.  Outpatient Encounter Medications as of 06/23/2022  Medication Sig   dolutegravir (TIVICAY) 50 MG tablet TAKE ONE TABLET BY MOUTH ONCE DAILY. STORE AT ROOM TEMPERATURE.   emtricitabine-tenofovir AF (DESCOVY) 200-25 MG tablet TAKE ONE TABLET BY MOUTH ONCE DAILY WITH OR WITHOUT FOOD.   ibuprofen (ADVIL) 200 MG tablet Take 1 tablet (200 mg total) by mouth every 6 (six) hours as needed.   methocarbamol (ROBAXIN) 500 MG tablet Take 1 tablet (500 mg total) by mouth every 8 (eight) hours as needed for muscle spasms.   ondansetron (ZOFRAN-ODT) 4 MG disintegrating tablet Take 1 tablet (4 mg total) by mouth every 8 (eight) hours as needed for nausea or vomiting.   traZODone (DESYREL) 100 MG tablet Take 1 tablet (100 mg total) by mouth at bedtime as needed for sleep.   traZODone (DESYREL) 50 MG tablet Take 0.5 tablets (25 mg total) by mouth at bedtime.   escitalopram (LEXAPRO) 20 MG tablet TAKE 1 TABLET BY MOUTH ONCE DAILY   No facility-administered encounter medications on file as of 06/23/2022.     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   Seizure (HCC) 02/04/2014   Poor appetite 10/03/2013   Dysmenorrhea 12/29/2012   Constipation 11/22/2012   LSIL (low grade squamous intraepithelial lesion) on Pap smear 04/07/2012   Depressed 04/01/2012    Sleep difficulties 11/26/2011   HIV (human immunodeficiency virus infection) (HCC) 10/07/2009     Health Maintenance Due  Topic Date Due   FOOT EXAM  Never done   OPHTHALMOLOGY EXAM  Never done   URINE MICROALBUMIN  Never done   Zoster Vaccines- Shingrix (1 of 2) Never done   HEMOGLOBIN A1C  06/13/2014   COLONOSCOPY (Pts 45-25yrs Insurance coverage will need to be confirmed)  Never done   COVID-19 Vaccine (4 - Pfizer risk series) 12/06/2020   PAP SMEAR-Modifier  05/02/2021   INFLUENZA VACCINE  06/02/2022     Review of Systems Review of Systems  Constitutional: Negative for fever, chills, diaphoresis, activity change, appetite change, fatigue and unexpected weight change.  HENT: Negative for congestion, sore throat, rhinorrhea, sneezing, trouble swallowing and sinus pressure.  Eyes: Negative for photophobia and visual disturbance.  Respiratory: Negative for cough, chest tightness, shortness of breath, wheezing and stridor.  Cardiovascular: Negative for chest pain, palpitations and leg swelling.  Gastrointestinal: Negative for nausea, vomiting, abdominal pain, diarrhea, constipation, blood in stool, abdominal distention and anal bleeding.  Genitourinary: Negative for dysuria, hematuria, flank pain and difficulty urinating.  Musculoskeletal: Negative for myalgias, back pain, joint swelling, arthralgias and gait problem.  Skin: Negative for color change, pallor, rash and wound.  Neurological: Negative for dizziness, tremors, weakness and light-headedness.  Hematological: Negative for adenopathy. Does not bruise/bleed easily.  Psychiatric/Behavioral: Negative for behavioral problems, confusion, sleep disturbance, dysphoric mood, decreased concentration and agitation.   Physical Exam  BP 111/71   Pulse 84   Temp 98 F (36.7 C) (Oral)   Ht 5\' 3"  (1.6 m)   Wt 145 lb (65.8 kg)   LMP 10/03/2019   BMI 25.69 kg/m   Physical Exam  Constitutional:  oriented to person, place, and  time. appears well-developed and well-nourished. No distress.  HENT: [place]/AT, PERRLA, no scleral icterus Mouth/Throat: Oropharynx is clear and moist. No oropharyngeal exudate.  Cardiovascular: Normal rate, regular rhythm and normal heart sounds. Exam reveals no gallop and no friction rub.  No murmur heard.  Pulmonary/Chest: Effort normal and breath sounds normal. No respiratory distress.  has no wheezes.  Neck = supple, no nuchal rigidity Abdominal: Soft. Bowel sounds are normal.  exhibits no distension. There is no tenderness.  Lymphadenopathy: no cervical adenopathy. No axillary adenopathy Neurological: alert and oriented to person, place, and time.  Skin: Skin is warm and dry. No rash noted. No erythema.  Psychiatric: a normal mood and affect.  behavior is normal.   Lab Results  Component Value Date   CD4TCELL 14 (L) 04/24/2022   Lab Results  Component Value Date   CD4TABS 468 08/21/2021   CD4TABS 391 (L) 01/13/2021   CD4TABS 391 (L) 06/05/2020   Lab Results  Component Value Date   HIV1RNAQUANT 272 (H) 05/14/2022   Lab Results  Component Value Date   HEPBSAB NEG 06/05/2011   Lab Results  Component Value Date   LABRPR NON-REACTIVE 01/13/2021    CBC Lab Results  Component Value Date   WBC 3.9 (L) 04/19/2022   RBC 4.74 04/19/2022   HGB 14.2 04/19/2022   HCT 42.3 04/19/2022   PLT 132 (L) 04/19/2022   MCV 89.2 04/19/2022   MCH 30.0 04/19/2022   MCHC 33.6 04/19/2022   RDW 13.2 04/19/2022   LYMPHSABS 2.5 04/19/2022   MONOABS 0.3 04/19/2022   EOSABS 0.1 04/19/2022    BMET Lab Results  Component Value Date   NA 139 04/19/2022   K 3.9 04/19/2022   CL 109 04/19/2022   CO2 24 04/19/2022   GLUCOSE 93 04/19/2022   BUN 6 04/19/2022   CREATININE 1.12 (H) 04/19/2022   CALCIUM 9.7 04/19/2022   GFRNONAA >60 04/19/2022   GFRAA 64 01/13/2021      Assessment and Plan  HIV disease= Will check labs today And plan to change to tivicay-symtuza as her main  regimen  Will have her come back in 6 wk to ensure she is undetectable on the new regimen  Long term medication management = cr is stable on recent labs  Health maintenance = will need to make sure she has had flu and covid vaccine this fall and also plan for her to get colonoscopy

## 2022-06-24 LAB — T-HELPER CELL (CD4) - (RCID CLINIC ONLY)
CD4 % Helper T Cell: 17 % — ABNORMAL LOW (ref 33–65)
CD4 T Cell Abs: 391 /uL — ABNORMAL LOW (ref 400–1790)

## 2022-06-25 LAB — CBC WITH DIFFERENTIAL/PLATELET
Absolute Monocytes: 380 cells/uL (ref 200–950)
Basophils Absolute: 30 cells/uL (ref 0–200)
Basophils Relative: 0.6 %
Eosinophils Absolute: 520 cells/uL — ABNORMAL HIGH (ref 15–500)
Eosinophils Relative: 10.4 %
HCT: 37.1 % (ref 35.0–45.0)
Hemoglobin: 12.8 g/dL (ref 11.7–15.5)
Lymphs Abs: 2670 cells/uL (ref 850–3900)
MCH: 30.9 pg (ref 27.0–33.0)
MCHC: 34.5 g/dL (ref 32.0–36.0)
MCV: 89.6 fL (ref 80.0–100.0)
MPV: 10.7 fL (ref 7.5–12.5)
Monocytes Relative: 7.6 %
Neutro Abs: 1400 cells/uL — ABNORMAL LOW (ref 1500–7800)
Neutrophils Relative %: 28 %
Platelets: 202 10*3/uL (ref 140–400)
RBC: 4.14 10*6/uL (ref 3.80–5.10)
RDW: 15.4 % — ABNORMAL HIGH (ref 11.0–15.0)
Total Lymphocyte: 53.4 %
WBC: 5 10*3/uL (ref 3.8–10.8)

## 2022-06-25 LAB — COMPLETE METABOLIC PANEL WITH GFR
AG Ratio: 1.2 (calc) (ref 1.0–2.5)
ALT: 13 U/L (ref 6–29)
AST: 23 U/L (ref 10–35)
Albumin: 4.2 g/dL (ref 3.6–5.1)
Alkaline phosphatase (APISO): 98 U/L (ref 37–153)
BUN/Creatinine Ratio: 8 (calc) (ref 6–22)
BUN: 9 mg/dL (ref 7–25)
CO2: 26 mmol/L (ref 20–32)
Calcium: 10.3 mg/dL (ref 8.6–10.4)
Chloride: 106 mmol/L (ref 98–110)
Creat: 1.08 mg/dL — ABNORMAL HIGH (ref 0.50–1.03)
Globulin: 3.6 g/dL (calc) (ref 1.9–3.7)
Glucose, Bld: 117 mg/dL — ABNORMAL HIGH (ref 65–99)
Potassium: 3.6 mmol/L (ref 3.5–5.3)
Sodium: 140 mmol/L (ref 135–146)
Total Bilirubin: 0.4 mg/dL (ref 0.2–1.2)
Total Protein: 7.8 g/dL (ref 6.1–8.1)
eGFR: 63 mL/min/{1.73_m2} (ref >=60)

## 2022-06-25 LAB — HIV-1 RNA QUANT-NO REFLEX-BLD
HIV 1 RNA Quant: 114 Copies/mL — ABNORMAL HIGH
HIV-1 RNA Quant, Log: 2.06 Log cps/mL — ABNORMAL HIGH

## 2022-07-21 ENCOUNTER — Encounter: Payer: Self-pay | Admitting: Infectious Diseases

## 2022-07-21 ENCOUNTER — Other Ambulatory Visit (HOSPITAL_COMMUNITY): Payer: Self-pay

## 2022-07-21 ENCOUNTER — Ambulatory Visit (INDEPENDENT_AMBULATORY_CARE_PROVIDER_SITE_OTHER): Payer: BC Managed Care – PPO | Admitting: Infectious Diseases

## 2022-07-21 ENCOUNTER — Other Ambulatory Visit: Payer: Self-pay

## 2022-07-21 VITALS — BP 136/85 | HR 94 | Temp 98.3°F | Wt 147.0 lb

## 2022-07-21 DIAGNOSIS — B20 Human immunodeficiency virus [HIV] disease: Secondary | ICD-10-CM | POA: Diagnosis not present

## 2022-07-21 DIAGNOSIS — R42 Dizziness and giddiness: Secondary | ICD-10-CM

## 2022-07-21 DIAGNOSIS — R509 Fever, unspecified: Secondary | ICD-10-CM

## 2022-07-21 MED ORDER — TIVICAY 50 MG PO TABS
50.0000 mg | ORAL_TABLET | Freq: Every day | ORAL | 11 refills | Status: DC
  Filled 2022-07-21: qty 30, 30d supply, fill #0

## 2022-07-21 MED ORDER — SYMTUZA 800-150-200-10 MG PO TABS
1.0000 | ORAL_TABLET | Freq: Every day | ORAL | 11 refills | Status: DC
  Filled 2022-07-21: qty 30, 30d supply, fill #0

## 2022-07-21 MED ORDER — TIVICAY 50 MG PO TABS: 50.0000 mg | ORAL_TABLET | Freq: Every day | ORAL | 11 refills | Status: DC

## 2022-07-21 MED ORDER — SYMTUZA 800-150-200-10 MG PO TABS
1.0000 | ORAL_TABLET | Freq: Every day | ORAL | 11 refills | Status: DC
Start: 2022-07-21 — End: 2023-07-16

## 2022-07-21 NOTE — Patient Instructions (Addendum)
Your dizziness I think may be due to allergies, but the fevers you have I want to test you for COVID to make sure you don't have that.   Could be any upper respiratory virus though causing your symptoms. Would recommend to stay home if you are feeling ill with fevers until your testing comes back.   Your HIV Medication should be:  Symtuza (large yellow pill)  AND Tivicay (small circle yellow pill).   *take these together everyday with food.   Please get your flu shot after you start feeling better.   Come back to see Dr. Linus Salmons in 2 months on your new medications.   Your medications have been sent to the mail order pharmacy your insurance requires.

## 2022-07-21 NOTE — Assessment & Plan Note (Signed)
Low level viremia - plan to switch to Tivicay + Symtyza regimen but has not received; sent to incorrect pharmacy. Will send to [facility] mail [facility] as insurance dictates. We went over with pill chart what she will take once she receives her new medication bottles.  Recommended flu vaccine when she is feeling better.  Pap smear q31yr - overdue for this. Will have her schedule appt with me to return for this in a few months after she has been on new ARVs for a while.   Return in about 2 months (around 09/20/2022). (30 m appt)

## 2022-07-21 NOTE — Assessment & Plan Note (Signed)
She reports fever and cough + sore throat that started within the last few days. Dizziness for a week now. On exam she has normal appearing conjunctiva, no rhinorrhea, some nasal congestion and left middle ear effusion without bacterial infection; c/w viral syndrome I suspect. Will collect COVID test given she works at [employer], recommend she stay home if she feels ill and has fever/cough. After we collected, it is my understanding it will take up until next week for this to result.....  Can return to work masked for 5 days if she feels better/imroving and if no fever > 24h without antipyretics.   I gave her 2 weeks of OTC antihistamines as well to start in the event she has some allergic symptoms contributing to her dizziness.

## 2022-07-21 NOTE — Progress Notes (Signed)
Subjective:     Chief Complaint  Patient presents with   Follow-up    Coughing/  OTC cough medication( does not know the name)/ dizzy spells a few weeks/     HPI: [name] walked into clinic yesterday to request an appt for dizziness.  She has a PCP upstairs at [facility].   She last saw Dr. Drue Second about 3 weeks ago for routine HIV follow up care - takes tivicay / descovy that was switched to Symtuza + Tivicay due to low level viremia 150-300 copies over this year. She has not yet received these and indicates she is on Descovy + Tivicay still.   She feels she has had some dizzy spells not about 1 week ago where the room feels like it is spinning. She also has a dry cough and a sore throat with fevers / chills that started within the last few days. She feels more dizzy than she has in the past.  She does have some trouble with allergies, historically.     Review of Systems: Review of Systems  Constitutional:  Positive for activity change, chills and fever. Negative for appetite change.  HENT:  Positive for sore throat. Negative for congestion, facial swelling, mouth sores, rhinorrhea, sinus pressure and sinus pain.   Respiratory:  Positive for cough.   Cardiovascular: Negative.   Gastrointestinal: Negative.   Genitourinary: Negative.   Musculoskeletal: Negative.   Neurological:  Positive for dizziness.      Past Medical History:  Diagnosis Date   Abnormal Pap smear    Back pain    Depression    HIV disease (HCC)     Outpatient Medications Prior to Visit  Medication Sig Dispense Refill   ibuprofen (ADVIL) 200 MG tablet Take 1 tablet (200 mg total) by mouth every 6 (six) hours as needed. 15 tablet 0   methocarbamol (ROBAXIN) 500 MG tablet Take 1 tablet (500 mg total) by mouth every 8 (eight) hours as needed for muscle spasms. 60 tablet 0   ondansetron (ZOFRAN-ODT) 4 MG disintegrating tablet Take 1 tablet (4 mg total) by mouth every 8 (eight) hours as needed  for nausea or vomiting. 20 tablet 0   traZODone (DESYREL) 100 MG tablet Take 1 tablet (100 mg total) by mouth at bedtime as needed for sleep. 30 tablet 11   traZODone (DESYREL) 50 MG tablet Take 0.5 tablets (25 mg total) by mouth at bedtime. 30 tablet 3   Darunavir-Cobicistat-Emtricitabine-Tenofovir Alafenamide (SYMTUZA) 800-150-200-10 MG TABS Take 1 tablet by mouth daily with breakfast. 30 tablet 11   dolutegravir (TIVICAY) 50 MG tablet Take 1 tablet (50 mg total) by mouth daily. 30 tablet 11   escitalopram (LEXAPRO) 20 MG tablet TAKE 1 TABLET BY MOUTH ONCE DAILY 30 tablet 2   No facility-administered medications prior to visit.    No Known Allergies  No family history on file.      Objective:  Objective  Vitals:   07/21/22 1114  BP: 136/85  Pulse: 94  Temp: 98.3 F (36.8 C)   Body mass index is 26.04 kg/m.   Physical Exam  Physical Exam Vitals reviewed.  Constitutional:      Appearance: Normal appearance.  HENT:     Right Ear: Tympanic membrane normal.     Left Ear: A middle ear effusion is present. Tympanic membrane is not injected.     Mouth/Throat:     Mouth: Mucous membranes are moist.     Pharynx: No oropharyngeal  exudate.  Eyes:     Conjunctiva/sclera: Conjunctivae normal.     Pupils: Pupils are equal, round, and reactive to light.  Cardiovascular:     Rate and Rhythm: Normal rate and regular rhythm.  Pulmonary:     Effort: Pulmonary effort is normal.     Breath sounds: Normal breath sounds.  Abdominal:     General: Bowel sounds are normal. There is no distension.     Palpations: Abdomen is soft.  Musculoskeletal:        General: Normal range of motion.  Skin:    General: Skin is warm and dry.  Neurological:     Mental Status: She is alert and oriented to person, place, and time.          Assessment & Plan:    Problem List Items Addressed This Visit       Unprioritized   HIV (human immunodeficiency virus infection) ([place])    Low level  viremia - plan to switch to Tivicay + Symtyza regimen but has not received; sent to incorrect pharmacy. Will send to [facility] mail order as insurance dictates. We went over with pill chart what she will take once she receives her new medication bottles.  Recommended flu vaccine when she is feeling better.  Pap smear q36yr - overdue for this. Will have her schedule appt with me to return for this in a few months after she has been on new ARVs for a while.   Return in about 2 months (around 09/20/2022). (30 m appt)        Relevant Medications   dolutegravir (TIVICAY) 50 MG tablet   Darunavir-Cobicistat-Emtricitabine-Tenofovir Alafenamide (SYMTUZA) 800-150-200-10 MG TABS   Dizziness    She reports fever and cough + sore throat that started within the last few days. Dizziness for a week now. On exam she has normal appearing conjunctiva, no rhinorrhea, some nasal congestion and left middle ear effusion without bacterial infection; c/w viral syndrome I suspect. Will collect COVID test given she works at [employer], recommend she stay home if she feels ill and has fever/cough. After we collected, it is my understanding it will take up until next week for this to result.....  Can return to work masked for 5 days if she feels better/imroving and if no fever > 24h without antipyretics.   I gave her 2 weeks of OTC antihistamines as well to start in the event she has some allergic symptoms contributing to her dizziness.       Other Visit Diagnoses     Fever, unspecified fever cause    -  Primary   Relevant Orders   SARS-CoV-2 RNA (COVID-19) and Respiratory Viral Panel, Qualitative NAAT       Anita Madeira, MSN, NP-C [place] [facility] Office: [phone] Pager: [phone]

## 2022-07-24 LAB — SARS-COV-2 RNA (COVID-19) RESP VIRAL PNL QL NAAT

## 2022-07-27 ENCOUNTER — Telehealth: Payer: Self-pay

## 2022-07-27 NOTE — Telephone Encounter (Signed)
Attempted to call patient to relay results. No answer. Left HIPAA-compliant voicemail requesting call back.  Binnie Kand, RN

## 2022-07-27 NOTE — Telephone Encounter (Signed)
-----   Message from [place] Callas, NP sent at 07/24/2022  8:55 PM EDT ----- Please call Aubree to let her know that her covid test results are negative  I hope she is feeling better.

## 2022-07-28 NOTE — Telephone Encounter (Signed)
Spoke with patient and relayed results per provider. Patient verbalized understanding and stated that she was feeling a little better. No additional questions.  Binnie Kand, RN

## 2022-08-03 ENCOUNTER — Other Ambulatory Visit: Payer: Self-pay

## 2022-08-03 ENCOUNTER — Encounter: Payer: Self-pay | Admitting: Internal Medicine

## 2022-08-03 ENCOUNTER — Ambulatory Visit (INDEPENDENT_AMBULATORY_CARE_PROVIDER_SITE_OTHER): Payer: BC Managed Care – PPO | Admitting: Internal Medicine

## 2022-08-03 VITALS — BP 113/78 | HR 72 | Resp 16 | Ht 63.0 in | Wt 149.0 lb

## 2022-08-03 DIAGNOSIS — D721 Eosinophilia, unspecified: Secondary | ICD-10-CM | POA: Diagnosis not present

## 2022-08-03 DIAGNOSIS — B20 Human immunodeficiency virus [HIV] disease: Secondary | ICD-10-CM | POA: Diagnosis not present

## 2022-08-03 DIAGNOSIS — Z23 Encounter for immunization: Secondary | ICD-10-CM

## 2022-08-03 NOTE — Progress Notes (Signed)
RFV: follow up for hiv disease  Patient ID: [name], female   DOB: [birth year], 51 y.o.   MRN: [id number]  HPI  [name] is 51yo F with well controled with hiv disease with tivicay/symtuza.   Left for [place] for one month and was well. Works at a and t housekeeping  Her husband not hiv positive works at [employer] in [place] Medications as of 08/03/2022  Medication Sig   Darunavir-Cobicistat-Emtricitabine-Tenofovir Alafenamide (SYMTUZA) 800-150-200-10 MG TABS Take 1 tablet by mouth daily with breakfast.   dolutegravir (TIVICAY) 50 MG tablet Take 1 tablet (50 mg total) by mouth daily.   ibuprofen (ADVIL) 200 MG tablet Take 1 tablet (200 mg total) by mouth every 6 (six) hours as needed.   methocarbamol (ROBAXIN) 500 MG tablet Take 1 tablet (500 mg total) by mouth every 8 (eight) hours as needed for muscle spasms.   ondansetron (ZOFRAN-ODT) 4 MG disintegrating tablet Take 1 tablet (4 mg total) by mouth every 8 (eight) hours as needed for nausea or vomiting.   traZODone (DESYREL) 100 MG tablet Take 1 tablet (100 mg total) by mouth at bedtime as needed for sleep.   traZODone (DESYREL) 50 MG tablet Take 0.5 tablets (25 mg total) by mouth at bedtime.   escitalopram (LEXAPRO) 20 MG tablet TAKE 1 TABLET BY MOUTH ONCE DAILY   No facility-administered encounter medications on file as of 08/03/2022.     Patient Active Problem List   Diagnosis Date Noted   Viral syndrome 04/24/2022   Need for prophylactic vaccination and inoculation against influenza 08/21/2021   Vertigo [id number]   Umbilical hernia [id number]   Dizziness 03/03/2017   Screening examination for venereal disease 09/12/2015   Encounter for long-term (current) use of medications 09/12/2015   History of JC encephalitis, 2012 02/05/2014   Seizure ([place]) 02/04/2014   Dysmenorrhea 12/29/2012   LSIL (low grade squamous intraepithelial lesion) on Pap smear 04/07/2012   Sleep difficulties 11/26/2011   HIV (human  immunodeficiency virus infection) ([place]) 10/07/2009     Health Maintenance Due  Topic Date Due   FOOT EXAM  Never done   OPHTHALMOLOGY EXAM  Never done   Diabetic kidney evaluation - Urine ACR  Never done   Zoster Vaccines- Shingrix (1 of 2) Never done   HEMOGLOBIN A1C  06/13/2014   COLONOSCOPY (Pts 45-33yr Insurance coverage will need to be confirmed)  Never done   COVID-19 Vaccine (4 - Pfizer risk series) 12/06/2020   PAP SMEAR-Modifier  05/02/2021   INFLUENZA VACCINE  06/02/2022     Review of Systems Review of Systems  Constitutional: Negative for fever, chills, diaphoresis, activity change, appetite change, fatigue and unexpected weight change.  HENT: Negative for congestion, sore throat, rhinorrhea, sneezing, trouble swallowing and sinus pressure.  Eyes: Negative for photophobia and visual disturbance.  Respiratory: Negative for cough, chest tightness, shortness of breath, wheezing and stridor.  Cardiovascular: Negative for chest pain, palpitations and leg swelling.  Gastrointestinal: Negative for nausea, vomiting, abdominal pain, diarrhea, constipation, blood in stool, abdominal distention and anal bleeding.  Genitourinary: Negative for dysuria, hematuria, flank pain and difficulty urinating.  Musculoskeletal: Negative for myalgias, back pain, joint swelling, arthralgias and gait problem.  Skin: Negative for color change, pallor, rash and wound.  Neurological: Negative for dizziness, tremors, weakness and light-headedness.  Hematological: Negative for adenopathy. Does not bruise/bleed easily.  Psychiatric/Behavioral: Negative for behavioral problems, confusion, sleep disturbance, dysphoric mood, decreased concentration and agitation.   Physical Exam   BP 113/78  Pulse 72   Resp 16   Ht '5\' 3"'$  (1.6 m)   Wt 149 lb (67.6 kg)   LMP 10/03/2019   SpO2 98%   BMI 26.39 kg/m   Physical Exam  Constitutional:  oriented to person, place, and time. appears well-developed and  well-nourished. No distress.  HENT: [place]/AT, PERRLA, no scleral icterus Mouth/Throat: Oropharynx is clear and moist. No oropharyngeal exudate.  Cardiovascular: Normal rate, regular rhythm and normal heart sounds. Exam reveals no gallop and no friction rub.  No murmur heard.  Pulmonary/Chest: Effort normal and breath sounds normal. No respiratory distress.  has no wheezes.  Neck = supple, no nuchal rigidity Abdominal: Soft. Bowel sounds are normal.  exhibits no distension. There is no tenderness.  Lymphadenopathy: no cervical adenopathy. No axillary adenopathy Neurological: alert and oriented to person, place, and time.  Skin: Skin is warm and dry. No rash noted. No erythema.  Psychiatric: a normal mood and affect.  behavior is normal.   Lab Results  Component Value Date   CD4TCELL 17 (L) 06/23/2022   Lab Results  Component Value Date   CD4TABS 391 (L) 06/23/2022   CD4TABS 468 08/21/2021   CD4TABS 391 (L) 01/13/2021   Lab Results  Component Value Date   HIV1RNAQUANT 114 (H) 06/23/2022   Lab Results  Component Value Date   HEPBSAB NEG 06/05/2011   Lab Results  Component Value Date   LABRPR NON-REACTIVE 01/13/2021    CBC Lab Results  Component Value Date   WBC 5.0 06/23/2022   RBC 4.14 06/23/2022   HGB 12.8 06/23/2022   HCT 37.1 06/23/2022   PLT 202 06/23/2022   MCV 89.6 06/23/2022   MCH 30.9 06/23/2022   MCHC 34.5 06/23/2022   RDW 15.4 (H) 06/23/2022   LYMPHSABS 2,670 06/23/2022   MONOABS 0.3 04/19/2022   EOSABS 520 (H) 06/23/2022    BMET Lab Results  Component Value Date   NA 140 06/23/2022   K 3.6 06/23/2022   CL 106 06/23/2022   CO2 26 06/23/2022   GLUCOSE 117 (H) 06/23/2022   BUN 9 06/23/2022   CREATININE 1.08 (H) 06/23/2022   CALCIUM 10.3 06/23/2022   GFRNONAA >60 04/19/2022   GFRAA 64 01/13/2021      Assessment and Plan Hiv disease = will plan to get cbc with diff, Viral load-- continue with new regimen  of tivicay plus symtuza  Health  maintenance = received flu shot. Will do papsmear visit set up.

## 2022-08-10 LAB — CBC WITH DIFFERENTIAL/PLATELET
Absolute Monocytes: 299 cells/uL (ref 200–950)
Basophils Absolute: 21 cells/uL (ref 0–200)
Basophils Relative: 0.5 %
Eosinophils Absolute: 168 cells/uL (ref 15–500)
Eosinophils Relative: 4.1 %
HCT: 41.9 % (ref 35.0–45.0)
Hemoglobin: 14.4 g/dL (ref 11.7–15.5)
Lymphs Abs: 2112 cells/uL (ref 850–3900)
MCH: 31.3 pg (ref 27.0–33.0)
MCHC: 34.4 g/dL (ref 32.0–36.0)
MCV: 91.1 fL (ref 80.0–100.0)
MPV: 10.9 fL (ref 7.5–12.5)
Monocytes Relative: 7.3 %
Neutro Abs: 1501 cells/uL (ref 1500–7800)
Neutrophils Relative %: 36.6 %
Platelets: 206 10*3/uL (ref 140–400)
RBC: 4.6 10*6/uL (ref 3.80–5.10)
RDW: 13.2 % (ref 11.0–15.0)
Total Lymphocyte: 51.5 %
WBC: 4.1 10*3/uL (ref 3.8–10.8)

## 2022-08-10 LAB — STRONGYLOIDES ANTIBODY: Strongyloides IgG Antibody, ELISA: NEGATIVE

## 2022-08-10 LAB — HIV-1 RNA QUANT-NO REFLEX-BLD
HIV 1 RNA Quant: 98 Copies/mL — ABNORMAL HIGH
HIV-1 RNA Quant, Log: 1.99 Log cps/mL — ABNORMAL HIGH

## 2022-08-31 ENCOUNTER — Other Ambulatory Visit (HOSPITAL_COMMUNITY): Payer: Self-pay

## 2022-09-01 ENCOUNTER — Telehealth: Payer: Self-pay

## 2022-09-01 ENCOUNTER — Other Ambulatory Visit (HOSPITAL_COMMUNITY): Payer: Self-pay

## 2022-09-01 NOTE — Telephone Encounter (Signed)
[facility] Patient Advocate Encounter   Was successful in obtaining a [employer] copay card for Symtuza.  This copay card will make the patients copay 0.00.  I have spoken with the patient.         Omar Gayden, CPhT [facility] Patient Advocate [facility] Phone: [phone] Fax:  [phone]  

## 2022-10-01 ENCOUNTER — Ambulatory Visit: Payer: BC Managed Care – PPO | Admitting: Internal Medicine

## 2022-10-19 ENCOUNTER — Other Ambulatory Visit (HOSPITAL_COMMUNITY): Payer: Self-pay

## 2022-10-21 ENCOUNTER — Other Ambulatory Visit: Payer: Self-pay | Admitting: Pharmacist

## 2022-10-21 DIAGNOSIS — B20 Human immunodeficiency virus [HIV] disease: Secondary | ICD-10-CM

## 2022-10-21 MED ORDER — SYMTUZA 800-150-200-10 MG PO TABS: 1.0000 | ORAL_TABLET | Freq: Every day | ORAL | 0 refills | Status: AC

## 2022-10-21 NOTE — Progress Notes (Signed)
Medication Samples have been provided to the patient.  Drug name: Symtuza        Strength: 800/150/200/10 mg Qty: 30 tablets (1 bottle)  LOT: [id number]   Exp.Date: 02/01/24  Dosing instructions: Take one tablet by mouth once daily with food  The patient has been instructed regarding the correct time, dose, and frequency of taking this medication, including desired effects and most common side effects.   Marykay Mccleod L. Jannette Fogo, PharmD, BCIDP, AAHIVP, CPP Clinical Pharmacist Practitioner Infectious Diseases Clinical Pharmacist [facility] 10/14/2020, 10:07 AM

## 2022-11-16 ENCOUNTER — Ambulatory Visit (INDEPENDENT_AMBULATORY_CARE_PROVIDER_SITE_OTHER): Payer: BC Managed Care – PPO | Admitting: Internal Medicine

## 2022-11-16 ENCOUNTER — Other Ambulatory Visit: Payer: Self-pay

## 2022-11-16 ENCOUNTER — Ambulatory Visit (INDEPENDENT_AMBULATORY_CARE_PROVIDER_SITE_OTHER): Payer: BC Managed Care – PPO

## 2022-11-16 ENCOUNTER — Encounter: Payer: Self-pay | Admitting: Internal Medicine

## 2022-11-16 VITALS — BP 120/70 | HR 74 | Temp 97.3°F | Ht 63.0 in | Wt 151.0 lb

## 2022-11-16 DIAGNOSIS — Z79899 Other long term (current) drug therapy: Secondary | ICD-10-CM | POA: Diagnosis not present

## 2022-11-16 DIAGNOSIS — Z23 Encounter for immunization: Secondary | ICD-10-CM | POA: Diagnosis not present

## 2022-11-16 DIAGNOSIS — Z1231 Encounter for screening mammogram for malignant neoplasm of breast: Secondary | ICD-10-CM

## 2022-11-16 DIAGNOSIS — B20 Human immunodeficiency virus [HIV] disease: Secondary | ICD-10-CM | POA: Diagnosis not present

## 2022-11-16 NOTE — Progress Notes (Signed)
RFV: follow up for hiv disease  Patient ID: [name], female   DOB: [birth year], 52 y.o.   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 And her husband works at [employer].  -- wants to get driver license, but she doesn't know how to read. She knows the signage accordingly  Has 72 year old grandson that she cares for since her daughter poorly controlled bipoalr disease (had 1yr old son die of hypothermia when homeless)   Outpatient Encounter Medications as of 11/16/2022  Medication Sig   Darunavir-Cobicistat-Emtricitabine-Tenofovir Alafenamide (SYMTUZA) 800-150-200-10 MG TABS Take 1 tablet by mouth daily with breakfast.   dolutegravir (TIVICAY) 50 MG tablet Take 1 tablet (50 mg total) by mouth daily.   ibuprofen (ADVIL) 200 MG tablet Take 1 tablet (200 mg total) by mouth every 6 (six) hours as needed.   rosuvastatin (CRESTOR) 40 MG tablet Take 40 mg by mouth daily.   traZODone (DESYREL) 50 MG tablet Take 0.5 tablets (25 mg total) by mouth at bedtime.   Darunavir-Cobicistat-Emtricitabine-Tenofovir Alafenamide (SYMTUZA) 800-150-200-10 MG TABS Take 1 tablet by mouth daily with breakfast.   escitalopram (LEXAPRO) 20 MG tablet TAKE 1 TABLET BY MOUTH ONCE DAILY   methocarbamol (ROBAXIN) 500 MG tablet Take 1 tablet (500 mg total) by mouth every 8 (eight) hours as needed for muscle spasms. (Patient not taking: Reported on 11/16/2022)   ondansetron (ZOFRAN-ODT) 4 MG disintegrating tablet Take 1 tablet (4 mg total) by mouth every 8 (eight) hours as needed for nausea or vomiting. (Patient not taking: Reported on 11/16/2022)   traZODone (DESYREL) 100 MG tablet Take 1 tablet (100 mg total) by mouth at bedtime as needed for sleep. (Patient not taking: Reported on 11/16/2022)   No facility-administered encounter medications on file as of 11/16/2022.     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   Seizure ([place]) 02/04/2014   Dysmenorrhea 12/29/2012   LSIL (low grade squamous intraepithelial lesion) on Pap smear 04/07/2012   Sleep difficulties 11/26/2011   HIV (human immunodeficiency virus infection) ([place]) 10/07/2009     Health Maintenance Due  Topic Date Due   FOOT EXAM  Never done   OPHTHALMOLOGY EXAM  Never done   Diabetic kidney evaluation - Urine ACR  Never done   Zoster Vaccines- Shingrix (1 of 2) Never done   HEMOGLOBIN A1C  06/13/2014   COLONOSCOPY (Pts 45-59yrs Insurance coverage will need to be confirmed)  Never done   PAP SMEAR-Modifier  05/02/2021   COVID-19 Vaccine (4 - 2023-24 season) 07/03/2022     Review of Systems Review of Systems  Constitutional: Negative for fever, chills, diaphoresis, activity change, appetite change, fatigue and unexpected weight change.  HENT: Negative for congestion, sore throat, rhinorrhea, sneezing, trouble swallowing and sinus pressure.  Eyes: Negative for photophobia and visual disturbance.  Respiratory: Negative for cough, chest tightness, shortness of breath, wheezing and stridor.  Cardiovascular: Negative for chest pain, palpitations and leg swelling.  Gastrointestinal: Negative for nausea, vomiting, abdominal pain, diarrhea, constipation, blood in stool, abdominal distention and  anal bleeding.  Genitourinary: Negative for dysuria, hematuria, flank pain and difficulty urinating.  Musculoskeletal: Negative for myalgias, back pain, joint swelling, arthralgias and gait problem.  Skin: Negative for color change, pallor, rash and wound.  Neurological: Negative  for dizziness, tremors, weakness and light-headedness.  Hematological: Negative for adenopathy. Does not bruise/bleed easily.  Psychiatric/Behavioral: Negative for behavioral problems, confusion, sleep disturbance, dysphoric mood, decreased concentration and agitation.   Physical Exam   BP 120/70   Pulse 74   Temp (!) 97.3 F (36.3 C) (Oral)   Ht 5\' 3"  (1.6 m)   Wt 151 lb (68.5 kg)   LMP 10/03/2019   SpO2 100%   BMI 26.75 kg/m   Physical Exam  Constitutional:  oriented to person, place, and time. appears well-developed and well-nourished. No distress.  HENT: /AT, PERRLA, no scleral icterus Mouth/Throat: Oropharynx is clear and moist. No oropharyngeal exudate.  Cardiovascular: Normal rate, regular rhythm and normal heart sounds. Exam reveals no gallop and no friction rub.  No murmur heard.  Pulmonary/Chest: Effort normal and breath sounds normal. No respiratory distress.  has no wheezes.  Neck = supple, no nuchal rigidity Abdominal: Soft. Bowel sounds are normal.  exhibits no distension. There is no tenderness.  Lymphadenopathy: no cervical adenopathy. No axillary adenopathy Neurological: alert and oriented to person, place, and time.  Skin: Skin is warm and dry. No rash noted. No erythema.  Psychiatric: a normal mood and affect.  behavior is normal.   Lab Results  Component Value Date   CD4TCELL 17 (L) 06/23/2022   Lab Results  Component Value Date   CD4TABS 391 (L) 06/23/2022   CD4TABS 468 08/21/2021   CD4TABS 391 (L) 01/13/2021   Lab Results  Component Value Date   HIV1RNAQUANT 98 (H) 08/03/2022   Lab Results  Component Value Date   HEPBSAB NEG 06/05/2011   Lab Results  Component Value Date   LABRPR NON-REACTIVE 01/13/2021    CBC Lab Results  Component Value Date   WBC 4.1 08/03/2022   RBC 4.60 08/03/2022   HGB 14.4 08/03/2022   HCT 41.9 08/03/2022   PLT 206 08/03/2022   MCV 91.1 08/03/2022   MCH 31.3 08/03/2022   MCHC 34.4 08/03/2022   RDW 13.2  08/03/2022   LYMPHSABS 2,112 08/03/2022   MONOABS 0.3 04/19/2022   EOSABS 168 08/03/2022    BMET Lab Results  Component Value Date   NA 140 06/23/2022   K 3.6 06/23/2022   CL 106 06/23/2022   CO2 26 06/23/2022   GLUCOSE 117 (H) 06/23/2022   BUN 9 06/23/2022   CREATININE 1.08 (H) 06/23/2022   CALCIUM 10.3 06/23/2022   GFRNONAA >60 04/19/2022   GFRAA 64 01/13/2021      Assessment and Plan  Hiv disease= anticipate to be well controlled. Will check labs. Has refills until 9 mo  Long term medication management = will check cr  Covid booster today  Will need to check if mammo covered

## 2022-11-17 LAB — T-HELPER CELLS (CD4) COUNT (NOT AT ARMC)
CD4 % Helper T Cell: 19 % — ABNORMAL LOW (ref 33–65)
CD4 T Cell Abs: 323 /uL — ABNORMAL LOW (ref 400–1790)

## 2022-11-19 LAB — COMPLETE METABOLIC PANEL WITH GFR
AG Ratio: 1.1 (calc) (ref 1.0–2.5)
ALT: 28 U/L (ref 6–29)
AST: 26 U/L (ref 10–35)
Albumin: 4.5 g/dL (ref 3.6–5.1)
Alkaline phosphatase (APISO): 106 U/L (ref 37–153)
BUN: 19 mg/dL (ref 7–25)
CO2: 29 mmol/L (ref 20–32)
Calcium: 10.5 mg/dL — ABNORMAL HIGH (ref 8.6–10.4)
Chloride: 105 mmol/L (ref 98–110)
Creat: 0.98 mg/dL (ref 0.50–1.03)
Globulin: 4.2 g/dL (calc) — ABNORMAL HIGH (ref 1.9–3.7)
Glucose, Bld: 97 mg/dL (ref 65–99)
Potassium: 3.4 mmol/L — ABNORMAL LOW (ref 3.5–5.3)
Sodium: 141 mmol/L (ref 135–146)
Total Bilirubin: 0.4 mg/dL (ref 0.2–1.2)
Total Protein: 8.7 g/dL — ABNORMAL HIGH (ref 6.1–8.1)
eGFR: 70 mL/min/{1.73_m2} (ref >=60)

## 2022-11-19 LAB — CBC WITH DIFFERENTIAL/PLATELET
Absolute Monocytes: 277 cells/uL (ref 200–950)
Basophils Absolute: 21 cells/uL (ref 0–200)
Basophils Relative: 0.6 %
Eosinophils Absolute: 140 cells/uL (ref 15–500)
Eosinophils Relative: 4 %
HCT: 40.5 % (ref 35.0–45.0)
Hemoglobin: 13.9 g/dL (ref 11.7–15.5)
Lymphs Abs: 1904 cells/uL (ref 850–3900)
MCH: 31.9 pg (ref 27.0–33.0)
MCHC: 34.3 g/dL (ref 32.0–36.0)
MCV: 92.9 fL (ref 80.0–100.0)
MPV: 11.1 fL (ref 7.5–12.5)
Monocytes Relative: 7.9 %
Neutro Abs: 1159 cells/uL — ABNORMAL LOW (ref 1500–7800)
Neutrophils Relative %: 33.1 %
Platelets: 216 10*3/uL (ref 140–400)
RBC: 4.36 10*6/uL (ref 3.80–5.10)
RDW: 13.4 % (ref 11.0–15.0)
Total Lymphocyte: 54.4 %
WBC: 3.5 10*3/uL — ABNORMAL LOW (ref 3.8–10.8)

## 2022-11-19 LAB — HIV-1 RNA QUANT-NO REFLEX-BLD
HIV 1 RNA Quant: 37 Copies/mL — ABNORMAL HIGH
HIV-1 RNA Quant, Log: 1.57 Log cps/mL — ABNORMAL HIGH

## 2022-11-19 LAB — RPR: RPR Ser Ql: NONREACTIVE

## 2022-12-23 ENCOUNTER — Telehealth: Payer: Self-pay

## 2022-12-23 NOTE — Telephone Encounter (Signed)
Patient requested to speak to triage nurse related to medication management. Triage as called pharmacy to confirm insurance coverage, medication delayed due to patient not having payment method on file. Staff   Jules Husbands has a co-pay of $47  Per [facility] pharmacy patient's husband was already given a co-pay assistance card to set up. Information provided to pharmacy  The following information was updated and copay is now at $0   Note [facility] Patient Advocate Encounter   Was successful in obtaining a [name] copay card for [name].  This copay card will make the patients copay $0.00.

## 2022-12-29 ENCOUNTER — Other Ambulatory Visit (HOSPITAL_COMMUNITY): Payer: Self-pay

## 2022-12-29 ENCOUNTER — Other Ambulatory Visit: Payer: Self-pay | Admitting: Pharmacist

## 2022-12-29 DIAGNOSIS — B20 Human immunodeficiency virus [HIV] disease: Secondary | ICD-10-CM

## 2022-12-29 MED ORDER — SYMTUZA 800-150-200-10 MG PO TABS: 1.0000 | ORAL_TABLET | Freq: Every day | ORAL | 0 refills | Status: DC

## 2022-12-29 NOTE — Progress Notes (Signed)
Medication Samples have been provided to the patient.  Drug name: Symtuza        Strength: 800/150/200/10 mg Qty: 30 Tablets (1 bottles) LOT: [id number]   Exp.Date: 4/25  Dosing instructions: Take one tablet by mouth once daily with food  The patient has been instructed regarding the correct time, dose, and frequency of taking this medication, including desired effects and most common side effects.   Alfonse Spruce, PharmD, CPP, BCIDP, [place] Clinical Pharmacist Practitioner [facility] [place] [facility]

## 2022-12-29 NOTE — Telephone Encounter (Deleted)
Patient and patient husband

## 2022-12-30 ENCOUNTER — Other Ambulatory Visit: Payer: Self-pay

## 2022-12-30 ENCOUNTER — Emergency Department (HOSPITAL_COMMUNITY): Payer: BC Managed Care – PPO

## 2022-12-30 ENCOUNTER — Emergency Department (HOSPITAL_COMMUNITY)
Admission: EM | Admit: 2022-12-30 | Discharge: 2022-12-30 | Disposition: A | Discharge status: Home / Self Care | Payer: BC Managed Care – PPO | Attending: Emergency Medicine | Admitting: Emergency Medicine

## 2022-12-30 ENCOUNTER — Encounter (HOSPITAL_COMMUNITY): Payer: Self-pay

## 2022-12-30 DIAGNOSIS — R110 Nausea: Secondary | ICD-10-CM | POA: Diagnosis present

## 2022-12-30 DIAGNOSIS — Z21 Asymptomatic human immunodeficiency virus [HIV] infection status: Secondary | ICD-10-CM | POA: Diagnosis not present

## 2022-12-30 DIAGNOSIS — N179 Acute kidney failure, unspecified: Secondary | ICD-10-CM | POA: Diagnosis not present

## 2022-12-30 DIAGNOSIS — E860 Dehydration: Secondary | ICD-10-CM

## 2022-12-30 DIAGNOSIS — Z1152 Encounter for screening for COVID-19: Secondary | ICD-10-CM | POA: Diagnosis not present

## 2022-12-30 DIAGNOSIS — R197 Diarrhea, unspecified: Secondary | ICD-10-CM

## 2022-12-30 LAB — URINALYSIS, ROUTINE W REFLEX MICROSCOPIC
Bacteria, UA: NONE SEEN
Bilirubin Urine: NEGATIVE
Glucose, UA: NEGATIVE mg/dL
Hgb urine dipstick: NEGATIVE
Ketones, ur: 5 mg/dL — AB
Leukocytes,Ua: NEGATIVE
Nitrite: NEGATIVE
Protein, ur: 100 mg/dL — AB
Specific Gravity, Urine: 1.033 — ABNORMAL HIGH (ref 1.005–1.030)
pH: 5 (ref 5.0–8.0)

## 2022-12-30 LAB — CBC
HCT: 48 % — ABNORMAL HIGH (ref 36.0–46.0)
Hemoglobin: 16.3 g/dL — ABNORMAL HIGH (ref 12.0–15.0)
MCH: 31.8 pg (ref 26.0–34.0)
MCHC: 34 g/dL (ref 30.0–36.0)
MCV: 93.8 fL (ref 80.0–100.0)
Platelets: 192 10*3/uL (ref 150–400)
RBC: 5.12 MIL/uL — ABNORMAL HIGH (ref 3.87–5.11)
RDW: 12.9 % (ref 11.5–15.5)
WBC: 3.9 10*3/uL — ABNORMAL LOW (ref 4.0–10.5)
nRBC: 0 % (ref 0.0–0.2)

## 2022-12-30 LAB — COMPREHENSIVE METABOLIC PANEL
ALT: 28 U/L (ref 0–44)
AST: 35 U/L (ref 15–41)
Albumin: 4.9 g/dL (ref 3.5–5.0)
Alkaline Phosphatase: 85 U/L (ref 38–126)
Anion gap: 8 (ref 5–15)
BUN: 21 mg/dL — ABNORMAL HIGH (ref 6–20)
CO2: 22 mmol/L (ref 22–32)
Calcium: 10.5 mg/dL — ABNORMAL HIGH (ref 8.9–10.3)
Chloride: 105 mmol/L (ref 98–111)
Creatinine, Ser: 1.66 mg/dL — ABNORMAL HIGH (ref 0.44–1.00)
GFR, Estimated: 37 mL/min — ABNORMAL LOW (ref >60)
Glucose, Bld: 117 mg/dL — ABNORMAL HIGH (ref 70–99)
Potassium: 4.3 mmol/L (ref 3.5–5.1)
Sodium: 135 mmol/L (ref 135–145)
Total Bilirubin: 0.6 mg/dL (ref 0.3–1.2)
Total Protein: 9.2 g/dL — ABNORMAL HIGH (ref 6.5–8.1)

## 2022-12-30 LAB — RESP PANEL BY RT-PCR (RSV, FLU A&B, COVID)  RVPGX2
Influenza A by PCR: NEGATIVE
Influenza B by PCR: NEGATIVE
Resp Syncytial Virus by PCR: NEGATIVE
SARS Coronavirus 2 by RT PCR: NEGATIVE

## 2022-12-30 LAB — LACTIC ACID, PLASMA: Lactic Acid, Venous: 1.3 mmol/L (ref 0.5–1.9)

## 2022-12-30 LAB — I-STAT BETA HCG BLOOD, ED (MC, WL, AP ONLY): I-stat hCG, quantitative: <5 m[IU]/mL (ref <5)

## 2022-12-30 LAB — LIPASE, BLOOD: Lipase: 47 U/L (ref 11–51)

## 2022-12-30 MED ORDER — ALUM & MAG HYDROXIDE-SIMETH 200-200-20 MG/5ML PO SUSP
30.0000 mL | Freq: Once | ORAL | Status: AC
  Administered 2022-12-30: 30 mL via ORAL
  Filled 2022-12-30: qty 30

## 2022-12-30 MED ORDER — ONDANSETRON HCL 4 MG PO TABS: 4.0000 mg | ORAL_TABLET | Freq: Three times a day (TID) | ORAL | 0 refills | Status: AC | PRN

## 2022-12-30 MED ORDER — MORPHINE SULFATE (PF) 4 MG/ML IV SOLN
4.0000 mg | Freq: Once | INTRAVENOUS | Status: AC
  Administered 2022-12-30: 4 mg via INTRAVENOUS
  Filled 2022-12-30: qty 1

## 2022-12-30 MED ORDER — ONDANSETRON HCL 4 MG/2ML IJ SOLN
4.0000 mg | Freq: Once | INTRAMUSCULAR | Status: AC
  Administered 2022-12-30: 4 mg via INTRAVENOUS
  Filled 2022-12-30: qty 2

## 2022-12-30 MED ORDER — LIDOCAINE VISCOUS HCL 2 % MT SOLN
15.0000 mL | Freq: Once | OROMUCOSAL | Status: AC
  Administered 2022-12-30: 15 mL via ORAL
  Filled 2022-12-30: qty 15

## 2022-12-30 MED ORDER — SODIUM CHLORIDE 0.9 % IV BOLUS
1000.0000 mL | Freq: Once | INTRAVENOUS | Status: AC
  Administered 2022-12-30: 1000 mL via INTRAVENOUS

## 2022-12-30 NOTE — ED Notes (Signed)
Pt had diarrhea while lying in bed. Pt cleaned and linens changed.

## 2022-12-30 NOTE — Discharge Instructions (Signed)
Your history, exam and workup today are consistent with a likely viral infection causing the nausea, diarrhea, and symptoms you are having today leading to dehydration.  Your kidney function was bumped slightly but we gave you some fluids.  The rest of your workup was reassuring.  You did have some sludge in your gallbladder but no evidence of acute cholecystitis.  We feel you are safe for discharge home but please rest and stay hydrated and use the nausea medicine.  I suspect this is viral so please stay out of work for the next few days.  If any symptoms change or worsen acutely, please return to the nearest emergency department.

## 2022-12-30 NOTE — ED Provider Notes (Signed)
[place] EMERGENCY DEPARTMENT AT [facility] Provider Note   CSN: [id number] Arrival date & time: 12/30/22  [id number]     History  Chief Complaint  Patient presents with   Abdominal Pain   Nausea   Diarrhea    [name] is a 52 y.o. female.  The history is provided by the patient, medical records and the spouse. No language interpreter was used.  Abdominal Pain Pain location:  Epigastric, RUQ and LUQ Pain quality: aching   Pain radiates to:  Chest (slightly to chest) Pain severity:  Severe Onset quality:  Gradual Duration:  4 days Timing:  Constant Progression:  Waxing and waning Chronicity:  New Context: not recent illness, not suspicious food intake and not trauma   Relieved by:  Nothing Worsened by:  Nothing Ineffective treatments:  None tried Associated symptoms: chest pain, chills, diarrhea, dysuria, fatigue and nausea   Associated symptoms: no constipation, no cough, no shortness of breath, no vaginal bleeding, no vaginal discharge and no vomiting   Diarrhea Quality:  Watery Associated symptoms: abdominal pain and chills   Associated symptoms: no headaches and no vomiting        Home Medications Prior to Admission medications   Medication Sig Start Date End Date Taking? Authorizing Provider  Darunavir-Cobicistat-Emtricitabine-Tenofovir Alafenamide [facility]) 800-150-200-10 MG TABS Take 1 tablet by mouth daily with breakfast. 07/21/22   [place] Callas, NP  Darunavir-Cobicistat-Emtricitabine-Tenofovir Alafenamide [facility]) 800-150-200-10 MG TABS Take 1 tablet by mouth daily with breakfast. 12/29/22 01/28/23  Esmond Plants, RPH-CPP  dolutegravir (TIVICAY) 50 MG tablet Take 1 tablet (50 mg total) by mouth daily. 07/21/22   [place] Callas, NP  escitalopram (LEXAPRO) 20 MG tablet TAKE 1 TABLET BY MOUTH ONCE DAILY 07/16/20 07/16/21  Leeroy Cha, MD  ibuprofen (ADVIL) 200 MG tablet Take 1 tablet (200 mg total) by mouth every 6 (six) hours as  needed. 10/11/20   Rosil[place] Oz, MD  methocarbamol (ROBAXIN) 500 MG tablet Take 1 tablet (500 mg total) by mouth every 8 (eight) hours as needed for muscle spasms. Patient not taking: Reported on 11/16/2022 05/19/22   Carlyle Basques, MD  ondansetron (ZOFRAN-ODT) 4 MG disintegrating tablet Take 1 tablet (4 mg total) by mouth every 8 (eight) hours as needed for nausea or vomiting. Patient not taking: Reported on 11/16/2022 04/24/22   Thayer Headings, MD  rosuvastatin (CRESTOR) 40 MG tablet Take 40 mg by mouth daily. 10/23/22   [provider]  traZODone (DESYREL) 100 MG tablet Take 1 tablet (100 mg total) by mouth at bedtime as needed for sleep. Patient not taking: Reported on 11/16/2022 08/21/21   Thayer Headings, MD  traZODone (DESYREL) 50 MG tablet Take 0.5 tablets (25 mg total) by mouth at bedtime. 05/19/22   Carlyle Basques, MD      Allergies    Patient has no known allergies.    Review of Systems   Review of Systems  Constitutional:  Positive for chills and fatigue.  HENT:  Positive for congestion and rhinorrhea.   Eyes:  Negative for visual disturbance.  Respiratory:  Negative for cough, chest tightness and shortness of breath.   Cardiovascular:  Positive for chest pain. Negative for palpitations and leg swelling.  Gastrointestinal:  Positive for abdominal pain, diarrhea and nausea. Negative for abdominal distention, constipation and vomiting.  Genitourinary:  Positive for dysuria and frequency. Negative for vaginal bleeding and vaginal discharge.  Musculoskeletal:  Negative for back pain, neck pain and neck stiffness.  Skin:  Negative  for rash and wound.  Neurological:  Negative for syncope, weakness, light-headedness, numbness and headaches.  Psychiatric/Behavioral:  Negative for agitation and confusion.   All other systems reviewed and are negative.   Physical Exam Updated Vital Signs BP 122/88   Pulse 99   Temp 97.8 F (36.6 C) (Oral)   Resp 18   Ht '5\' 3"'$  (1.6  m)   Wt 77.1 kg   LMP 10/03/2019   SpO2 100%   BMI 30.11 kg/m  Physical Exam Vitals and nursing note reviewed.  Constitutional:      General: She is not in acute distress.    Appearance: She is well-developed. She is not ill-appearing, toxic-appearing or diaphoretic.  HENT:     Head: Normocephalic and atraumatic.     Right Ear: External ear normal.     Left Ear: External ear normal.     Nose: Congestion present.     Mouth/Throat:     Mouth: Mucous membranes are dry.     Pharynx: No oropharyngeal exudate or posterior oropharyngeal erythema.  Eyes:     Extraocular Movements: Extraocular movements intact.     Conjunctiva/sclera: Conjunctivae normal.     Pupils: Pupils are equal, round, and reactive to light.  Cardiovascular:     Rate and Rhythm: Normal rate.     Pulses: Normal pulses.     Heart sounds: Normal heart sounds. No murmur heard. Pulmonary:     Effort: Pulmonary effort is normal. No respiratory distress.     Breath sounds: No stridor. No wheezing, rhonchi or rales.  Chest:     Chest wall: No tenderness.  Abdominal:     General: Abdomen is flat. Bowel sounds are increased. There is no distension.     Tenderness: There is abdominal tenderness in the right upper quadrant, epigastric area and left upper quadrant. There is no right CVA tenderness, left CVA tenderness, guarding or rebound.    Musculoskeletal:        General: No tenderness.     Cervical back: Normal range of motion and neck supple. No tenderness.     Right lower leg: No edema.     Left lower leg: No edema.  Skin:    General: Skin is warm.     Capillary Refill: Capillary refill takes less than 2 seconds.     Coloration: Skin is not pale.     Findings: No erythema or rash.  Neurological:     General: No focal deficit present.     Mental Status: She is alert and oriented to person, place, and time.     Sensory: No sensory deficit.     Motor: No weakness or abnormal muscle tone.     Deep Tendon  Reflexes: Reflexes are normal and symmetric.  Psychiatric:        Mood and Affect: Mood normal.     ED Results / Procedures / Treatments   Labs (all labs ordered are listed, but only abnormal results are displayed) Labs Reviewed  COMPREHENSIVE METABOLIC PANEL - Abnormal; Notable for the following components:      Result Value   Glucose, Bld 117 (*)    BUN 21 (*)    Creatinine, Ser 1.66 (*)    Calcium 10.5 (*)    Total Protein 9.2 (*)    GFR, Estimated 37 (*)    All other components within normal limits  CBC - Abnormal; Notable for the following components:   WBC 3.9 (*)    RBC 5.12 (*)  Hemoglobin 16.3 (*)    HCT 48.0 (*)    All other components within normal limits  URINALYSIS, ROUTINE W REFLEX MICROSCOPIC - Abnormal; Notable for the following components:   Color, Urine AMBER (*)    APPearance CLOUDY (*)    Specific Gravity, Urine 1.033 (*)    Ketones, ur 5 (*)    Protein, ur 100 (*)    All other components within normal limits  RESP PANEL BY RT-PCR (RSV, FLU A&B, COVID)  RVPGX2  URINE CULTURE  LIPASE, BLOOD  LACTIC ACID, PLASMA  I-STAT BETA HCG BLOOD, ED ([facility], [facility], [facility] ONLY)    EKG EKG Interpretation  Date/Time:  Wednesday December 30 2022 11:43:42 EST Ventricular Rate:  78 PR Interval:  136 QRS Duration: 75 QT Interval:  376 QTC Calculation: 429 R Axis:   51 Text Interpretation: Sinus rhythm LAE, consider biatrial enlargement when compared to prior, similar appearance. No STEMI Confirmed by Antony Blackbird [phone]) on 12/30/2022 1:38:57 PM  Radiology US Abdomen Limited RUQ (LIVER/GB)  Result Date: 12/30/2022 CLINICAL DATA:  Right upper quadrant pain EXAM: ULTRASOUND ABDOMEN LIMITED RIGHT UPPER QUADRANT COMPARISON:  None Available. FINDINGS: Gallbladder: Question mild sludge in an otherwise normal appearing gallbladder. Common bile duct: Diameter: 5.8 mm Liver: No focal lesion identified. Within normal limits in parenchymal echogenicity. Portal vein is patent on  color Doppler imaging with normal direction of blood flow towards the liver. Other: None. IMPRESSION: 1. Question mild sludge in an otherwise normal appearing gallbladder. 2. The common bile duct is at the upper limits of normal in size. 3. No other abnormalities. Electronically Signed   By: Dorise Bullion III M.D.   On: 12/30/2022 13:23   DG Chest Portable 1 View  Result Date: 12/30/2022 CLINICAL DATA:  Epigastric chest pain and chills EXAM: PORTABLE CHEST 1 VIEW COMPARISON:  04/19/2022 FINDINGS: The heart size and mediastinal contours are within normal limits. Both lungs are clear. The visualized skeletal structures are unremarkable. IMPRESSION: No active disease. Electronically Signed   By: Van Clines M.D.   On: 12/30/2022 12:25    Procedures Procedures    Medications Ordered in ED Medications  alum & mag hydroxide-simeth (MAALOX/MYLANTA) 200-200-20 MG/5ML suspension 30 mL (30 mLs Oral Given 12/30/22 1142)    And  lidocaine (XYLOCAINE) 2 % viscous mouth solution 15 mL (15 mLs Oral Given 12/30/22 1142)  morphine (PF) 4 MG/ML injection 4 mg (4 mg Intravenous Given 12/30/22 1142)  ondansetron (ZOFRAN) injection 4 mg (4 mg Intravenous Given 12/30/22 1142)  sodium chloride 0.9 % bolus 1,000 mL (0 mLs Intravenous Stopped 12/30/22 1345)    ED Course/ Medical Decision Making/ A&P                             Medical Decision Making Amount and/or Complexity of Data Reviewed Labs: ordered. Radiology: ordered.  Risk OTC drugs. Prescription drug management.    Cleda Hiltunen is a 52 y.o. female with a past medical history significant for reportedly well-controlled HIV, seizures, previous JC encephalitis, vertigo, depression, and umbilical hernia who presents with 4 days of nausea, diarrhea, chills, malaise, fatigue, abdominal pain, mild chest discomfort, and dysuria.  According to patient, the last 4 days she has had all the symptoms.  She says she has had nausea and diarrhea but no  vomiting.  She says she has had urinary frequency and dysuria but denies any other vaginal symptoms.  Denies any constipation.  Denies any blood  in her stools.  Denies any headache, neck pain, or acute back pain.  Denies any cough or shortness of breath but does report some chest discomfort that she says is coming from her epigastric area and is more aching and burning.  She denies any trauma.  Denies any neurologic complaints.  On exam, lungs were clear.  Chest was nontender.  Abdomen was tender in the upper abdomen.  She reports still has her gallbladder.  Bowel sounds are very active.  No flank or back tenderness.  No focal neurologic deficits.  Good pulses in extremities.  Patient overall well-appearing but does have dry mucous membranes.  She vital signs revealed she was not tachycardic, tachypneic, hypotensive or hypoxic.  She is afebrile.  Clinically I suspect a viral infection such as gastroenteritis or something like flu or COVID given her malaise and myalgias.  We will get a chest x-ray given the chest discomfort and will get cardiac workup.  We will get right upper quadrant ultrasound given epigastric tenderness and right upper quadrant tenderness with the nausea.  Will get screening labs we will give her some fluids, pain medicine, nausea some, and a GI cocktail.  Due to the dysuria chills and fatigue we will get urinalysis to make sure she does not have UTI or pyelonephritis.  IF workup is reassuring, anticipate discharge home with symptomatic management medications.   Ultrasound showed no acute cholecystitis and did show some sludge.  Patient was feeling better.  She improved after fluids.  She is not having nausea now.  Suspect viral gastroenteritis causing the nausea diarrhea and GI symptoms.  She did have an AKI on her workup but given her otherwise well appearance and passing a p.o. challenge I feel she is stable for discharge home with prescription for nausea medicine and follow-up with  PCP in the next several days to have a recheck of her kidney function.  She knows to return if symptoms change or worsen.  Patient under the presence or concerns and was discharged in good condition.         Final Clinical Impression(s) / ED Diagnoses Final diagnoses:  Diarrhea, unspecified type  Nausea  Dehydration  AKI (acute kidney injury) ([place])    Rx / DC Orders ED Discharge Orders          Ordered    ondansetron (ZOFRAN) 4 MG tablet  Every 8 hours PRN        12/30/22 1708            Clinical Impression: 1. Diarrhea, unspecified type   2. Nausea   3. Dehydration   4. AKI (acute kidney injury) ([place])     Disposition: Discharge  Condition: Good  I have discussed the results, Dx and Tx plan with the pt(& family if present). He/she/they expressed understanding and agree(s) with the plan. Discharge instructions discussed at great length. Strict return precautions discussed and pt &/or family have verbalized understanding of the instructions. No further questions at time of discharge.    New Prescriptions   ONDANSETRON (ZOFRAN) 4 MG TABLET    Take 1 tablet (4 mg total) by mouth every 8 (eight) hours as needed for nausea or vomiting.    Follow Up: [employer] [place] [phone] Schedule an appointment as soon as possible for a visit    [facility] Emergency Department at [facility] [id number] [place] [phone]  Luken Shadowens, Gwenyth Allegra, MD 12/30/22 [phone]

## 2022-12-30 NOTE — ED Notes (Signed)
Pt is able to hold down both fluids and the food.

## 2022-12-30 NOTE — ED Triage Notes (Signed)
Patient arrived to the ER for evaluation of abdominal pain with nausea and diarrhea since Sunday. Pt also reports increase in urination since Sunday.

## 2022-12-30 NOTE — ED Notes (Signed)
Pt received a ham sandwich and orange juice for PO/Fluid challenge.

## 2022-12-31 ENCOUNTER — Other Ambulatory Visit: Payer: Self-pay | Admitting: Pharmacist

## 2022-12-31 DIAGNOSIS — B20 Human immunodeficiency virus [HIV] disease: Secondary | ICD-10-CM

## 2022-12-31 LAB — URINE CULTURE: Culture: NO GROWTH

## 2022-12-31 MED ORDER — SYMTUZA 800-150-200-10 MG PO TABS: 1.0000 | ORAL_TABLET | Freq: Every day | ORAL | 0 refills | Status: AC

## 2022-12-31 NOTE — Progress Notes (Signed)
Medication Samples have been provided to the patient.  Drug name: Symtuza        Strength: 800/150/200/10 mg Qty: 30 tablets (1 bottle)  LOT: [id number]   Exp.Date: 02/2024  Dosing instructions: Take one tablet by mouth once daily with food  The patient has been instructed regarding the correct time, dose, and frequency of taking this medication, including desired effects and most common side effects.   Vondra Aldredge L. Eber Hong, PharmD, BCIDP, AAHIVP, CPP Clinical Pharmacist Practitioner Infectious Diseases [facility] 10/14/2020, 10:07 AM

## 2023-01-04 ENCOUNTER — Ambulatory Visit: Payer: BC Managed Care – PPO | Admitting: Internal Medicine

## 2023-01-19 ENCOUNTER — Ambulatory Visit: Payer: BC Managed Care – PPO

## 2023-02-04 NOTE — H&P (Signed)
[name] is an 52 y.o. postmenopausal G3P3  who is admitted for left Bartholin gland cyst incision and drainage with marsupialization due to recurrent, painful Bartholin gland cyst.  Patient Active Problem List   Diagnosis Date Noted   Viral syndrome 04/24/2022   Need for prophylactic vaccination and inoculation against influenza 08/21/2021   Vertigo 06/05/2020   Umbilical hernia 06/27/2018   Dizziness 03/03/2017   Screening examination for venereal disease 09/12/2015   Encounter for long-term (current) use of medications 09/12/2015   History of JC encephalitis, 2012 02/05/2014   Seizure 02/04/2014   Dysmenorrhea 12/29/2012   LSIL (low grade squamous intraepithelial lesion) on Pap smear 04/07/2012   Sleep difficulties 11/26/2011   HIV (human immunodeficiency virus infection) (HCC) 10/07/2009    MEDICAL/FAMILY/SOCIAL HX: Patient's last menstrual period was 10/03/2019.    Past Medical History:  Diagnosis Date   Abnormal Pap smear    Back pain    Depression    HIV disease (HCC)     No past surgical history on file.  No family history on file.  Social History:  reports that she has never smoked. She has never used smokeless tobacco. She reports that she does not drink alcohol and does not use drugs.  ALLERGIES/MEDS:  Allergies: No Known Allergies  No medications prior to admission.     Review of Systems  Constitutional: Negative.   HENT: Negative.    Eyes: Negative.   Respiratory: Negative.    Cardiovascular: Negative.   Gastrointestinal: Negative.   Genitourinary: Negative.   Musculoskeletal: Negative.   Skin: Negative.   Neurological: Negative.   Endo/Heme/Allergies: Negative.   Psychiatric/Behavioral: Negative.      Last menstrual period 10/03/2019. CONSTITUTIONAL: alert, oriented, NAD, pleasant.        SKIN: normal, no rash.        LUNGS: clear to auscultation bilaterally, no wheezes, rhonchi, rales.        HEART: no murmurs, regular rate and rhythm.         ABDOMEN: no masses palpated, soft and not tender.        FEMALE GENITOURINARY: labia - unremarkable, a left ~3cm Bartholin gland cyst present without drainage or erythema surrounding.        EXTREMITIES: no edema present.  No results found for this or any previous visit (from the past 24 hour(s)).  No results found.   ASSESSMENT/PLAN: [name] is a 52 y.o. postmenopausal G3P3  who is admitted for left Bartholin gland cyst incision and drainage with marsupialization due to recurrent, painful Bartholin gland cyst.  - Admit to [facility] - Admit labs (CBC, T&S, RPR) - Diet:  Per anesthesia - IVF:  Per anesthesia - VTE Prophylaxis:  SCDs - Antibiotics: None - D/C home same-day  Consents: Reviewed risks for injury to surrounding organs, blood loss, infection, and pain after surgery. Pt declines blood transfusion.   Steva Ready, DO

## 2023-02-16 ENCOUNTER — Encounter (HOSPITAL_BASED_OUTPATIENT_CLINIC_OR_DEPARTMENT_OTHER): Payer: Self-pay | Admitting: Obstetrics and Gynecology

## 2023-02-16 ENCOUNTER — Other Ambulatory Visit: Payer: Self-pay

## 2023-02-16 NOTE — Progress Notes (Addendum)
Spoke w/ via phone for pre-op interview---pt Lab needs dos----   cbc            Lab results------none COVID test -----patient states asymptomatic no test needed Arrive at -------815 am 02-24-2023 NPO after MN NO Solid Food.  Clear liquids from MN until---715 am Med rec completed Medications to take morning of surgery -----none Diabetic medication -----n/a Patient instructed no nail polish to be worn day of surgery Patient instructed to bring photo id and insurance card day of surgery Patient aware to have Driver (ride ) / caregiver  husband michael   for 24 hours after surgery  Patient Special Instructions -----none Pre-Op special Istructions ----reviewed pt pre op  instructions with husband michael per pt request Patient verbalized understanding of instructions that were given at this phone interview. Patient denies shortness of breath, chest pain, fever, cough at this phone interview.

## 2023-02-24 ENCOUNTER — Ambulatory Visit (HOSPITAL_BASED_OUTPATIENT_CLINIC_OR_DEPARTMENT_OTHER)
Admission: RE | Admit: 2023-02-24 | Discharge: 2023-02-24 | Disposition: A | Discharge status: Home / Self Care | Payer: BC Managed Care – PPO | Source: Ambulatory Visit | Attending: Obstetrics and Gynecology | Admitting: Obstetrics and Gynecology

## 2023-02-24 ENCOUNTER — Ambulatory Visit (HOSPITAL_BASED_OUTPATIENT_CLINIC_OR_DEPARTMENT_OTHER): Payer: BC Managed Care – PPO | Admitting: Anesthesiology

## 2023-02-24 ENCOUNTER — Encounter (HOSPITAL_BASED_OUTPATIENT_CLINIC_OR_DEPARTMENT_OTHER): Payer: Self-pay | Admitting: Obstetrics and Gynecology

## 2023-02-24 ENCOUNTER — Encounter (HOSPITAL_BASED_OUTPATIENT_CLINIC_OR_DEPARTMENT_OTHER)
Admission: RE | Disposition: A | Discharge status: Home / Self Care | Payer: Self-pay | Source: Ambulatory Visit | Attending: Obstetrics and Gynecology

## 2023-02-24 ENCOUNTER — Other Ambulatory Visit: Payer: Self-pay

## 2023-02-24 ENCOUNTER — Other Ambulatory Visit (HOSPITAL_COMMUNITY): Payer: Self-pay

## 2023-02-24 DIAGNOSIS — Z21 Asymptomatic human immunodeficiency virus [HIV] infection status: Secondary | ICD-10-CM | POA: Insufficient documentation

## 2023-02-24 DIAGNOSIS — N750 Cyst of Bartholin's gland: Secondary | ICD-10-CM | POA: Diagnosis present

## 2023-02-24 DIAGNOSIS — Z01818 Encounter for other preprocedural examination: Secondary | ICD-10-CM

## 2023-02-24 HISTORY — PX: BARTHOLIN CYST MARSUPIALIZATION: SHX5383

## 2023-02-24 HISTORY — DX: Personal history of other specified conditions: Z87.898

## 2023-02-24 LAB — CBC
HCT: 42.1 % (ref 36.0–46.0)
Hemoglobin: 14.6 g/dL (ref 12.0–15.0)
MCH: 31.8 pg (ref 26.0–34.0)
MCHC: 34.7 g/dL (ref 30.0–36.0)
MCV: 91.7 fL (ref 80.0–100.0)
Platelets: 218 10*3/uL (ref 150–400)
RBC: 4.59 MIL/uL (ref 3.87–5.11)
RDW: 13.1 % (ref 11.5–15.5)
WBC: 3.8 10*3/uL — ABNORMAL LOW (ref 4.0–10.5)
nRBC: 0 % (ref 0.0–0.2)

## 2023-02-24 LAB — NO BLOOD PRODUCTS

## 2023-02-24 SURGERY — MARSUPIALIZATION, CYST, BARTHOLIN'S GLAND
Anesthesia: General | Site: Vagina

## 2023-02-24 MED ORDER — DEXAMETHASONE SODIUM PHOSPHATE 4 MG/ML IJ SOLN
INTRAMUSCULAR | Status: DC | PRN
  Administered 2023-02-24: 4 mg via INTRAVENOUS

## 2023-02-24 MED ORDER — ONDANSETRON HCL 4 MG/2ML IJ SOLN
INTRAMUSCULAR | Status: DC | PRN
  Administered 2023-02-24: 4 mg via INTRAVENOUS

## 2023-02-24 MED ORDER — OXYCODONE HCL 5 MG PO TABS
5.0000 mg | ORAL_TABLET | Freq: Once | ORAL | Status: AC | PRN
  Administered 2023-02-24: 5 mg via ORAL

## 2023-02-24 MED ORDER — ACETAMINOPHEN 500 MG PO TABS
ORAL_TABLET | ORAL | Status: AC
  Filled 2023-02-24: qty 2

## 2023-02-24 MED ORDER — OXYCODONE HCL 5 MG PO TABS
ORAL_TABLET | ORAL | Status: AC
  Filled 2023-02-24: qty 1

## 2023-02-24 MED ORDER — MIDAZOLAM HCL 2 MG/2ML IJ SOLN
INTRAMUSCULAR | Status: AC
  Filled 2023-02-24: qty 2

## 2023-02-24 MED ORDER — FENTANYL CITRATE (PF) 100 MCG/2ML IJ SOLN
INTRAMUSCULAR | Status: AC
  Filled 2023-02-24: qty 2

## 2023-02-24 MED ORDER — PROMETHAZINE HCL 25 MG/ML IJ SOLN: 6.2500 mg | INTRAMUSCULAR | Status: DC | PRN

## 2023-02-24 MED ORDER — OXYCODONE HCL 5 MG/5ML PO SOLN: 5.0000 mg | Freq: Once | ORAL | Status: AC | PRN

## 2023-02-24 MED ORDER — EPHEDRINE SULFATE (PRESSORS) 50 MG/ML IJ SOLN
INTRAMUSCULAR | Status: DC | PRN
  Administered 2023-02-24: 5 mg via INTRAVENOUS

## 2023-02-24 MED ORDER — 0.9 % SODIUM CHLORIDE (POUR BTL) OPTIME
TOPICAL | Status: DC | PRN
  Administered 2023-02-24: 1000 mL

## 2023-02-24 MED ORDER — LIDOCAINE HCL (CARDIAC) PF 100 MG/5ML IV SOSY
PREFILLED_SYRINGE | INTRAVENOUS | Status: DC | PRN
  Administered 2023-02-24: 60 mg via INTRAVENOUS

## 2023-02-24 MED ORDER — BUPIVACAINE HCL (PF) 0.25 % IJ SOLN
INTRAMUSCULAR | Status: DC | PRN
  Administered 2023-02-24: 10 mL

## 2023-02-24 MED ORDER — IBUPROFEN 800 MG PO TABS
800.0000 mg | ORAL_TABLET | Freq: Three times a day (TID) | ORAL | 0 refills | Status: DC | PRN
Start: 2023-02-24 — End: 2023-02-24

## 2023-02-24 MED ORDER — MIDAZOLAM HCL 2 MG/2ML IJ SOLN
INTRAMUSCULAR | Status: DC | PRN
  Administered 2023-02-24: 2 mg via INTRAVENOUS

## 2023-02-24 MED ORDER — IBUPROFEN 800 MG PO TABS
800.0000 mg | ORAL_TABLET | Freq: Three times a day (TID) | ORAL | 0 refills | Status: AC | PRN
Start: 2023-02-24 — End: ?
  Filled 2023-02-24 (×2): qty 30, 10d supply, fill #0

## 2023-02-24 MED ORDER — LACTATED RINGERS IV SOLN: INTRAVENOUS | Status: DC

## 2023-02-24 MED ORDER — PROPOFOL 10 MG/ML IV BOLUS
INTRAVENOUS | Status: DC | PRN
  Administered 2023-02-24: 200 mg via INTRAVENOUS

## 2023-02-24 MED ORDER — SCOPOLAMINE 1 MG/3DAYS TD PT72
MEDICATED_PATCH | TRANSDERMAL | Status: AC
  Filled 2023-02-24: qty 1

## 2023-02-24 MED ORDER — ACETAMINOPHEN 500 MG PO TABS: 1000.0000 mg | ORAL_TABLET | Freq: Once | ORAL | Status: DC

## 2023-02-24 MED ORDER — FENTANYL CITRATE (PF) 100 MCG/2ML IJ SOLN: 25.0000 ug | INTRAMUSCULAR | Status: DC | PRN

## 2023-02-24 MED ORDER — SCOPOLAMINE 1 MG/3DAYS TD PT72
1.0000 | MEDICATED_PATCH | TRANSDERMAL | Status: DC
  Administered 2023-02-24: 1.5 mg via TRANSDERMAL

## 2023-02-24 SURGICAL SUPPLY — 29 items
BLADE SURG 15 STRL LF DISP TIS (BLADE) ×1 IMPLANT
BLADE SURG 15 STRL SS (BLADE) ×1
CATH ROBINSON RED A/P 16FR (CATHETERS) ×1 IMPLANT
DRSG TELFA 3X8 NADH STRL (GAUZE/BANDAGES/DRESSINGS) ×1 IMPLANT
ELECT REM PT RETURN 9FT ADLT (ELECTROSURGICAL)
ELECTRODE REM PT RTRN 9FT ADLT (ELECTROSURGICAL) IMPLANT
GAUZE 4X4 16PLY ~~LOC~~+RFID DBL (SPONGE) ×1 IMPLANT
GLOVE BIO SURGEON STRL SZ 6.5 (GLOVE) ×2 IMPLANT
GLOVE BIOGEL PI IND STRL 6.5 (GLOVE) ×1 IMPLANT
GLOVE BIOGEL PI IND STRL 7.0 (GLOVE) ×1 IMPLANT
GOWN STRL REUS W/TWL LRG LVL3 (GOWN DISPOSABLE) ×2 IMPLANT
KIT TURNOVER CYSTO (KITS) ×1 IMPLANT
NDL HYPO 22X1.5 SAFETY MO (MISCELLANEOUS) IMPLANT
NEEDLE HYPO 22X1.5 SAFETY MO (MISCELLANEOUS) IMPLANT
NS IRRIG 1000ML POUR BTL (IV SOLUTION) ×1 IMPLANT
PACK VAGINAL MINOR WOMEN LF (CUSTOM PROCEDURE TRAY) ×1 IMPLANT
PAD OB MATERNITY 4.3X12.25 (PERSONAL CARE ITEMS) ×1 IMPLANT
PENCIL BUTTON HOLSTER BLD 10FT (ELECTRODE) ×1 IMPLANT
SLEEVE SCD COMPRESS KNEE MED (STOCKING) ×1 IMPLANT
SUT MON AB 3-0 SH 27 (SUTURE)
SUT MON AB 3-0 SH27 (SUTURE) IMPLANT
SUT VIC AB 3-0 CT1 27 (SUTURE)
SUT VIC AB 3-0 CT1 TAPERPNT 27 (SUTURE) IMPLANT
SUT VIC AB 3-0 SH 27 (SUTURE)
SUT VIC AB 3-0 SH 27X BRD (SUTURE) IMPLANT
SWAB COLLECTION DEVICE MRSA (MISCELLANEOUS) IMPLANT
SWAB CULTURE ESWAB REG 1ML (MISCELLANEOUS) IMPLANT
TUBE CONNECTING 12X1/4 (SUCTIONS) ×1 IMPLANT
YANKAUER SUCT BULB TIP NO VENT (SUCTIONS) ×1 IMPLANT

## 2023-02-24 NOTE — Anesthesia Procedure Notes (Signed)
Procedure Name: LMA Insertion Date/Time: 02/24/2023 9:44 AM  Performed by: Nelle Don, CRNAPre-anesthesia Checklist: Patient identified, Emergency Drugs available, Suction available and Patient being monitored Patient Re-evaluated:Patient Re-evaluated prior to induction Oxygen Delivery Method: Circle system utilized Preoxygenation: Pre-oxygenation with 100% oxygen Induction Type: IV induction LMA: LMA inserted LMA Size: 3.0 Number of attempts: 1 Dental Injury: Teeth and Oropharynx as per pre-operative assessment

## 2023-02-24 NOTE — Interval H&P Note (Signed)
History and Physical Interval Note:  02/24/2023 9:23 AM  [name]  has presented today for surgery, with the diagnosis of Bartholin Gland Cyst.  The various methods of treatment have been discussed with the patient and family. After consideration of risks, benefits and other options for treatment, the patient has consented to  Procedure(s): BARTHOLIN CYST MARSUPIALIZATION (N/A) as a surgical intervention.  The patient's history has been reviewed, patient examined, no change in status, stable for surgery.  I have reviewed the patient's chart and labs.  Questions were answered to the patient's satisfaction.     Steva Ready

## 2023-02-24 NOTE — Discharge Instructions (Signed)
  Post Anesthesia Home Care Instructions  Activity: Get plenty of rest for the remainder of the day. A responsible individual must stay with you for 24 hours following the procedure.  For the next 24 hours, DO NOT: -Drive a car -Advertising copywriter -Drink alcoholic beverages -Take any medication unless instructed by your physician -Make any legal decisions or sign important papers.  Meals: Start with liquid foods such as gelatin or soup. Progress to regular foods as tolerated. Avoid greasy, spicy, heavy foods. If nausea and/or vomiting occur, drink only clear liquids until the nausea and/or vomiting subsides. Call your physician if vomiting continues.  Special Instructions/Symptoms: Your throat may feel dry or sore from the anesthesia or the breathing tube placed in your throat during surgery. If this causes discomfort, gargle with warm salt water. The discomfort should disappear within 24 hours.  If you had a scopolamine patch placed behind your ear for the management of post- operative nausea and/or vomiting:  1. The medication in the patch is effective for 72 hours, after which it should be removed.  Wrap patch in a tissue and discard in the trash. Wash hands thoroughly with soap and water. 2. You may remove the patch earlier than 72 hours if you experience unpleasant side effects which may include dry mouth, dizziness or visual disturbances. 3. Avoid touching the patch. Wash your hands with soap and water after contact with the patch.    Remove patch behind left ear by Saturday, February 27, 2023.  Call your surgeon if you experience:   1.  Fever over 101.0. 2.  Inability to urinate. 3.  Nausea and/or vomiting. 4.  Extreme swelling or bruising at the surgical site. 5.  Continued bleeding from the incision. 6.  Increased pain, redness or drainage from the incision. 7.  Problems related to your pain medication. 8.  Any problems and/or concerns. 9. Pads only for vaginal drainage,  nothing in the vagina. 10.showers only beginning tomorrow.

## 2023-02-24 NOTE — Anesthesia Postprocedure Evaluation (Signed)
Anesthesia Post Note  Patient: [name]  Procedure(s) Performed: BARTHOLIN CYST MARSUPIALIZATION (Vagina )     Patient location during evaluation: PACU Anesthesia Type: General Level of consciousness: awake and alert Pain management: pain level controlled Vital Signs Assessment: post-procedure vital signs reviewed and stable Respiratory status: spontaneous breathing, nonlabored ventilation and respiratory function stable Cardiovascular status: stable and blood pressure returned to baseline Anesthetic complications: no   No notable events documented.  Last Vitals:  Vitals:   02/24/23 1045 02/24/23 1140  BP: (!) 133/53 (!) 148/86  Pulse: (!) 58 65  Resp: 16 16  Temp:  36.6 C  SpO2: 97% 100%    Last Pain:  Vitals:   02/24/23 1140  TempSrc:   PainSc: 3                  Beryle Lathe

## 2023-02-24 NOTE — Transfer of Care (Signed)
Immediate Anesthesia Transfer of Care Note  Patient: [name]  Procedure(s) Performed: BARTHOLIN CYST MARSUPIALIZATION (Vagina )  Patient Location: PACU  Anesthesia Type:General  Level of Consciousness: awake, alert , oriented, and patient cooperative  Airway & Oxygen Therapy: Patient Spontanous Breathing and Patient connected to nasal cannula oxygen  Post-op Assessment: Report given to RN and Post -op Vital signs reviewed and stable  Post vital signs: Reviewed and stable  Last Vitals:  Vitals Value Taken Time  BP 138/69 02/24/23 1024  Temp 36.4 C 02/24/23 1024  Pulse 64 02/24/23 1026  Resp 15 02/24/23 1026  SpO2 100 % 02/24/23 1026  Vitals shown include unvalidated device data.  Last Pain:  Vitals:   02/24/23 1024  TempSrc:   PainSc: 0-No pain      Patients Stated Pain Goal: 5 (02/24/23 0844)  Complications: No notable events documented.

## 2023-02-24 NOTE — Op Note (Addendum)
Pre Op Dx:   Left Bartholin gland cyst  Post Op Dx:   Same as pre-operative diagnosis  Procedure:   Incision and drainage of left Bartholin gland cyst with marsupialization  Surgeon:  Dr. Katrinka Blazing. Connye Burkitt Assistants:  None Anesthesia:  LMA  EBL:  5cc  IVF:  500cc UOP:  Voided prior to arrival to OR  Drains:  None Specimen removed:  Left Bartholin gland cyst wall - portion sent to pathology Device(s) implanted:  None Case Type:  Clean-contaminated Findings: Left ~3.5cm fluctuant Bartolin gland cyst with serosanguinous fluid drained. Complications: None Indications:  52 y.o. postmenopausal G3P3 with persistent, painful left Bartholin gland cyst.  Description of Procedure: After informed consent was obtained the patient was brought to the operating room. She was placed in dorsal supine position and anesthesia was administered. She was placed in dorsal lithotomy position and prepped and draped in the usual sterile fashion. A preoperative timeout was called. An incision was made in the vaginal mucosa down to the wall of the cyst. The contents of the cyst were evacuated. Portion of the cyst wall sent to pathology. Bovie electrosurgery used for hemostasis. The walls of the cyst was sutured to the vaginal mucosa with four simple interrupted 3-0 vicryl sutures. The cyst was then irrigated with normal saline. Local anesthetic (10cc) administered at the site. Silver nitrate was used for additional hemostasis. Hemostasis was verified. She was returned to dorsal supine position, awakened and extubated having appeared to tolerate the procedure well. All counts were correct. She was transferred to PACU in good condition.  Disposition:  PACU  Steva Ready, DO

## 2023-02-24 NOTE — Progress Notes (Signed)
Patient answered yes to having thoughts of harming herself during pre-op questionnaire. Patient stated that she has not had any plans of acting on it nor has she thought of ways to harm herself. Patient asked if she would like any resources and a therapist and she stated that she would. RN called [phone] and spoke with representative that provided number to Child psychotherapist. [phone].

## 2023-02-24 NOTE — Anesthesia Preprocedure Evaluation (Addendum)
Anesthesia Evaluation  Patient identified by MRN, date of birth, ID band Patient awake    Reviewed: Allergy & Precautions, NPO status , Patient's Chart, lab work & pertinent test results  History of Anesthesia Complications Negative for: history of anesthetic complications  Airway Mallampati: II  TM Distance: >3 FB Neck ROM: Full    Dental  (+) Dental Advisory Given, Chipped   Pulmonary neg pulmonary ROS   Pulmonary exam normal        Cardiovascular negative cardio ROS Normal cardiovascular exam     Neuro/Psych Seizures -, Well Controlled,  PSYCHIATRIC DISORDERS  Depression     Vertigo     GI/Hepatic negative GI ROS, Neg liver ROS,,,  Endo/Other  negative endocrine ROS    Renal/GU negative Renal ROS     Musculoskeletal negative musculoskeletal ROS (+)    Abdominal   Peds  Hematology  (+) HIV, REFUSES BLOOD PRODUCTS  Anesthesia Other Findings   Reproductive/Obstetrics                             Anesthesia Physical Anesthesia Plan  ASA: 2  Anesthesia Plan: General   Post-op Pain Management: Tylenol PO (pre-op)*   Induction: Intravenous  PONV Risk Score and Plan: 3 and Treatment may vary due to age or medical condition, Ondansetron, Midazolam and Scopolamine patch - Pre-op  Airway Management Planned: LMA  Additional Equipment: None  Intra-op Plan:   Post-operative Plan: Extubation in OR  Informed Consent: I have reviewed the patients History and Physical, chart, labs and discussed the procedure including the risks, benefits and alternatives for the proposed anesthesia with the patient or authorized representative who has indicated his/her understanding and acceptance.     Dental advisory given  Plan Discussed with: CRNA and Anesthesiologist  Anesthesia Plan Comments:         Anesthesia Quick Evaluation

## 2023-02-25 ENCOUNTER — Encounter (HOSPITAL_BASED_OUTPATIENT_CLINIC_OR_DEPARTMENT_OTHER): Payer: Self-pay | Admitting: Obstetrics and Gynecology

## 2023-02-25 LAB — SURGICAL PATHOLOGY

## 2023-03-10 ENCOUNTER — Ambulatory Visit
Admission: RE | Admit: 2023-03-10 | Discharge: 2023-03-10 | Disposition: A | Discharge status: Home / Self Care | Payer: BC Managed Care – PPO | Source: Ambulatory Visit | Attending: Internal Medicine | Admitting: Internal Medicine

## 2023-03-10 DIAGNOSIS — Z1231 Encounter for screening mammogram for malignant neoplasm of breast: Secondary | ICD-10-CM

## 2023-03-12 ENCOUNTER — Other Ambulatory Visit: Payer: Self-pay | Admitting: Internal Medicine

## 2023-03-12 DIAGNOSIS — R928 Other abnormal and inconclusive findings on diagnostic imaging of breast: Secondary | ICD-10-CM

## 2023-03-24 ENCOUNTER — Other Ambulatory Visit: Payer: Self-pay

## 2023-04-12 ENCOUNTER — Other Ambulatory Visit: Payer: Self-pay | Admitting: Internal Medicine

## 2023-04-12 NOTE — Telephone Encounter (Signed)
11 refills were sent 07/2022, patient should have refills on file.   Sandie Ano, RN

## 2023-07-16 ENCOUNTER — Other Ambulatory Visit: Payer: Self-pay | Admitting: Infectious Diseases

## 2023-07-16 NOTE — Telephone Encounter (Signed)
Patient overdue for follow up. Left voicemail asking patient to return my call.   Inga Noller Lesli Albee, CMA

## 2023-07-19 ENCOUNTER — Other Ambulatory Visit: Payer: Self-pay

## 2023-07-19 MED ORDER — TIVICAY 50 MG PO TABS: ORAL_TABLET | ORAL | 0 refills | Status: DC

## 2023-07-19 MED ORDER — SYMTUZA 800-150-200-10 MG PO TABS: ORAL_TABLET | ORAL | 0 refills | Status: DC

## 2023-07-29 ENCOUNTER — Other Ambulatory Visit: Payer: Self-pay

## 2023-07-29 ENCOUNTER — Ambulatory Visit: Payer: BC Managed Care – PPO | Admitting: Infectious Diseases

## 2023-07-29 ENCOUNTER — Encounter: Payer: Self-pay | Admitting: Infectious Diseases

## 2023-07-29 VITALS — BP 128/83 | HR 73 | Temp 97.5°F | Ht 63.0 in | Wt 148.0 lb

## 2023-07-29 DIAGNOSIS — Z23 Encounter for immunization: Secondary | ICD-10-CM

## 2023-07-29 DIAGNOSIS — R42 Dizziness and giddiness: Secondary | ICD-10-CM

## 2023-07-29 DIAGNOSIS — N951 Menopausal and female climacteric states: Secondary | ICD-10-CM

## 2023-07-29 DIAGNOSIS — B20 Human immunodeficiency virus [HIV] disease: Secondary | ICD-10-CM | POA: Diagnosis not present

## 2023-07-29 MED ORDER — MECLIZINE HCL 50 MG PO TABS
50.0000 mg | ORAL_TABLET | Freq: Three times a day (TID) | ORAL | 0 refills | Status: DC | PRN
Start: 2023-07-29 — End: 2024-09-19

## 2023-07-29 NOTE — Progress Notes (Signed)
Name: [name]  DOB: [birth year] MRN: [id number] PCP: Pcp, No    Subjective:   Chief Complaint  Patient presents with   Follow-up    Dizziness, headache, nausea, tired going on for a while     HPI: Has been feeling poorly - laying down a lot because of dizziness. Feels like the room is spinning and she has to hold on  Has been going on for years now but feels like this is worse lately. Estimates 2-3 times a month. Does not notice any particular season patterns. Random. Has to hold the wall sometimes when happens with walking.  No nasal congestion, ringinging in ears, changes in hearing; she does have  +eye itching.  No measured fevers, she gets hot flashes - maybe 2 or 3 years ago was her last period.   Follows with Dr. Drue Second - multiple missed appointments. LOV 11/2022.  Works at [employer] take care of young grandson and has concerns about his well being as her daughter has some substance/mental health challenges. She needs help with resources.   Takes Symtuza + Tivicay for HIV treatment - doing well on this and has always been highly adherent to her medications. No trouble getting them from her pharmacy.       07/29/2023    2:33 PM  Depression screen PHQ 2/9  Decreased Interest 0  Down, Depressed, Hopeless 0  PHQ - 2 Score 0    Review of Systems  Constitutional:  Positive for diaphoresis and malaise/fatigue. Negative for chills, fever and weight loss.  HENT:  Positive for congestion. Negative for ear discharge, ear pain, hearing loss, sore throat and tinnitus.        No dental problems  Eyes:  Positive for discharge and redness.  Respiratory:  Negative for cough and sputum production.   Cardiovascular:  Negative for chest pain and leg swelling.  Gastrointestinal:  Negative for abdominal pain, diarrhea and vomiting.  Genitourinary:  Negative for dysuria and flank pain.  Musculoskeletal:  Negative for joint pain, myalgias and neck pain.  Skin:  Negative for rash.   Neurological:  Positive for dizziness and headaches. Negative for tingling.  Psychiatric/Behavioral:  Negative for depression and substance abuse. The patient is not nervous/anxious and does not have insomnia.      Past Medical History:  Diagnosis Date   Abnormal Pap smear    Depression    History of seizures    2019 or 2020 seizure x 1 no cause found no current neurologist   HIV disease (HCC)    Seizure (HCC) 02/04/2014   grand mal seizure x 1 hospital admission  no seizure since    Outpatient Medications Prior to Visit  Medication Sig Dispense Refill   ASPIRIN ADULT PO Take by mouth.     Darunavir-Cobicistat-Emtricitabine-Tenofovir Alafenamide (SYMTUZA) 800-150-200-10 MG TABS TAKE 1 TABLET BY MOUTH 1 TIME A DAY. 30 tablet 0   dolutegravir (TIVICAY) 50 MG tablet TAKE ONE TABLET BY MOUTH ONCE DAILY. STORE AT ROOM TEMPERATURE. 30 tablet 0   ibuprofen (ADVIL) 800 MG tablet Take 1 tablet (800 mg total) by mouth every 8 (eight) hours as needed for moderate pain or mild pain. 30 tablet 0   methocarbamol (ROBAXIN) 500 MG tablet Take 1 tablet (500 mg total) by mouth every 8 (eight) hours as needed for muscle spasms. 60 tablet 0   ondansetron (ZOFRAN) 4 MG tablet Take 1 tablet (4 mg total) by mouth every 8 (eight) hours as needed for nausea  or vomiting. 12 tablet 0   traZODone (DESYREL) 50 MG tablet Take 0.5 tablets (25 mg total) by mouth at bedtime. 30 tablet 3   escitalopram (LEXAPRO) 20 MG tablet TAKE 1 TABLET BY MOUTH ONCE DAILY (Patient taking differently: Take by mouth at bedtime.) 30 tablet 2   No facility-administered medications prior to visit.     No Known Allergies  Social History   Tobacco Use   Smoking status: Never   Smokeless tobacco: Never  Vaping Use   Vaping status: Never Used  Substance Use Topics   Alcohol use: No   Drug use: No    No family history on file.  Social History   Substance and Sexual Activity  Sexual Activity Yes   Partners: Male   Birth  control/protection: None   Comment: pt given condoms     Objective:   Vitals:   07/29/23 1432  BP: 128/83  Pulse: 73  Temp: (!) 97.5 F (36.4 C)  TempSrc: Temporal  SpO2: 98%  Weight: 148 lb (67.1 kg)  Height: 5\' 3"  (1.6 m)   Body mass index is 26.22 kg/m.  Physical Exam  Lab Results Lab Results  Component Value Date   WBC 3.8 (L) 02/24/2023   HGB 14.6 02/24/2023   HCT 42.1 02/24/2023   MCV 91.7 02/24/2023   PLT 218 02/24/2023    Lab Results  Component Value Date   CREATININE 1.66 (H) 12/30/2022   BUN 21 (H) 12/30/2022   NA 135 12/30/2022   K 4.3 12/30/2022   CL 105 12/30/2022   CO2 22 12/30/2022    Lab Results  Component Value Date   ALT 28 12/30/2022   AST 35 12/30/2022   ALKPHOS 85 12/30/2022   BILITOT 0.6 12/30/2022    Lab Results  Component Value Date   CHOL 228 (H) 04/24/2022   HDL 36 (L) 04/24/2022   LDLCALC 165 (H) 04/24/2022   TRIG 137 04/24/2022   CHOLHDL 6.3 (H) 04/24/2022   HIV 1 RNA Quant (Copies/mL)  Date Value  11/16/2022 37 (H)  08/03/2022 98 (H)  06/23/2022 114 (H)   CD4 T Cell Abs (/uL)  Date Value  11/16/2022 323 (L)  06/23/2022 391 (L)  08/21/2021 468     Assessment & Plan:   Problem List Items Addressed This Visit       Unprioritized   HIV (human immunodeficiency virus infection) (HCC)    Will update her vl / cd4 today. Flu vaccine today. Pap smear scheduled in December      Hot flashes due to menopause    LMP about 2-3 years ago. She is interested in more natural options for symptom management. Black cohosh may be helpful - separate from other HIV medications recommended by > 6 hours.  Offered referral to GYN to discuss HRT (I think her headaches are more d/t vertigo and they overall seem mild). She will consider but not needed at this time.       Vertigo    Exam and history c/w vertigo. She has a mild left middle ear effusion w/o any signs of infection.  Will treat with meclizine 50 mg QAM and a 2nd dose PRN  in afternoon.       Relevant Medications   meclizine (ANTIVERT) 50 MG tablet   Other Visit Diagnoses     HIV disease (HCC)    -  Primary   Relevant Orders   HIV 1 RNA quant-no reflex-bld   T-helper cells (CD4) count  Encounter for immunization       Relevant Orders   Flu vaccine trivalent PF, 6mos and older(Flulaval,Afluria,Fluarix,Fluzone) (Completed)       Rexene Alberts, MSN, NP-C [facility] Pager: [phone] Office: [phone]  07/29/23  4:16 PM

## 2023-07-29 NOTE — Assessment & Plan Note (Signed)
Exam and history c/w vertigo. She has a mild left middle ear effusion w/o any signs of infection.  Will treat with meclizine 50 mg QAM and a 2nd dose PRN in afternoon.

## 2023-07-29 NOTE — Patient Instructions (Addendum)
  Black Cohosh may be helpful supplement for your hot flashes.   Please call Claris Che @ [phone] to schedule a dental appointment   Will update your pap smear on 10/21/2023.   Start the meclizine (Antivert) every morning to see if this helps prevent the dizziness, nausea and symptoms you have noticed. Can repeat the dose in 8 hours / in the early evening.

## 2023-07-29 NOTE — Assessment & Plan Note (Signed)
Will update her vl / cd4 today. Flu vaccine today. Pap smear scheduled in December

## 2023-07-29 NOTE — Assessment & Plan Note (Signed)
LMP about 2-3 years ago. She is interested in more natural options for symptom management. Black cohosh may be helpful - separate from other HIV medications recommended by > 6 hours.  Offered referral to GYN to discuss HRT (I think her headaches are more d/t vertigo and they overall seem mild). She will consider but not needed at this time.

## 2023-07-30 LAB — T-HELPER CELLS (CD4) COUNT (NOT AT ARMC)
CD4 % Helper T Cell: 19 % — ABNORMAL LOW (ref 33–65)
CD4 T Cell Abs: 380 /uL — ABNORMAL LOW (ref 400–1790)

## 2023-07-31 LAB — HIV-1 RNA QUANT-NO REFLEX-BLD
HIV 1 RNA Quant: 67 {copies}/mL — ABNORMAL HIGH
HIV-1 RNA Quant, Log: 1.82 {Log} — ABNORMAL HIGH

## 2023-08-02 ENCOUNTER — Telehealth: Payer: Self-pay

## 2023-08-02 NOTE — Telephone Encounter (Signed)
Patient aware of results.   Felicita Nuncio P Chavonne Sforza, CMA  

## 2023-08-02 NOTE — Telephone Encounter (Signed)
-----   Message from [place] sent at 08/02/2023  4:13 PM EDT ----- Please call [name] to let her know that her labs look very good - her viral load is undetectable at only 67 copies. Her immune system levels are about the same at 380 cells.

## 2023-08-26 ENCOUNTER — Other Ambulatory Visit: Payer: Self-pay | Admitting: Internal Medicine

## 2023-10-21 ENCOUNTER — Ambulatory Visit: Payer: BC Managed Care – PPO | Admitting: Infectious Diseases

## 2023-11-11 ENCOUNTER — Other Ambulatory Visit: Payer: Self-pay

## 2023-11-11 ENCOUNTER — Ambulatory Visit (INDEPENDENT_AMBULATORY_CARE_PROVIDER_SITE_OTHER): Payer: 59 | Admitting: Infectious Diseases

## 2023-11-11 VITALS — BP 134/81 | HR 69 | Temp 97.8°F | Ht 63.0 in | Wt 145.0 lb

## 2023-11-11 DIAGNOSIS — B20 Human immunodeficiency virus [HIV] disease: Secondary | ICD-10-CM

## 2023-11-11 MED ORDER — TIVICAY 50 MG PO TABS: ORAL_TABLET | ORAL | 5 refills | Status: DC

## 2023-11-11 MED ORDER — SYMTUZA 800-150-200-10 MG PO TABS: ORAL_TABLET | ORAL | 5 refills | Status: DC

## 2023-11-11 NOTE — Patient Instructions (Addendum)
 Nice to see you!   Please come back to see either Corean or Dr. Luiz in 6 months.    To straighten the teeth - you need an ORTHODONTIST  [facility] [place] [phone]  Orthodontist [address]   [phone]  Teeth whitening  [facility]  Address: [address] [place], [place] [address] Areas served: [place] and nearby areas Phone: [phone]

## 2023-11-11 NOTE — Addendum Note (Signed)
 Addended by: Linna Hoff D on: 11/11/2023 04:57 PM   Modules accepted: Orders

## 2023-11-11 NOTE — Progress Notes (Signed)
 SUBJECTIVE    [name] is a 53 y.o. female here for an annual pelvic exam and pap smear.   Discussed the use of AI scribe software for clinical note transcription with the patient, who gave verbal consent to proceed.  History of Present Illness   [name] is here today for cervical cancer screening. She does not recall any abnormal results from previous tests. She is informed that if the test is normal, she will not need another one until 2028.  She is post menopausal. Headaches she previously experienced are gone with control over vertigo symptoms.     She has concerns about dental health, specifically the alignment and color of her teeth. She expresses a desire for orthodontic treatment, specifically braces, and teeth whitening. She has previously seen a dentist but is unsure about the difference between a dentist and an orthodontist. She is interested in setting up a payment plan for these treatments.       Review of Systems: Current GYN complaints or concerns: none.  Patient denies any abdominal/pelvic pain, problems with bowel movements, urination, vaginal discharge or intercourse.   Past Medical History:  Diagnosis Date   Abnormal Pap smear    Depression    History of seizures    2019 or 2020 seizure x 1 no cause found no current neurologist   HIV disease (HCC)    Seizure (HCC) 02/04/2014   grand mal seizure x 1 hospital admission  no seizure since    Gynecologic History: G17P0  Patient's last menstrual period was 10/03/2019. Contraception: post menopausal status Last Pap: 2019. Results were: normal Last Mammogram: 2024. Results were: normal    Objective  Physical Exam - chaperone present  Constitutional: Well developed, well nourished, no acute distress. She is alert and oriented x3.  Pelvic: External genitalia is normal in appearance. The vagina is normal in appearance. The cervix is bulbous and easily visualized. No CMT, normal expected cervical mucus  present. Bimanual exam reveals uterus that is felt to be normal size, shape, and contour. No adnexal masses or tenderness noted. Breasts: symmetrical in contour, shape and texture. No palpable masses/nodules. No nipple discharge.  Psych: She has a normal mood and affect.      Assessment & Plan:       Dental Health -  Patient expressed interest in orthodontic treatment for teeth straightening and whitening. -Provided information for local orthodontist and teeth whitening services.  Cervical Cancer Screening Patient due for routine Pap smear, with previous tests being normal. -Perform Pap smear today. -If results are normal, next Pap smear due in 2028.   HIV -  Continue symtuza  and tivicay  together everyday with food. Her viral load was only 67 copies which is great.  -Refills provided -Follow up in 6 months       No orders of the defined types were placed in this encounter.   Meds ordered this encounter  Medications   dolutegravir  (TIVICAY ) 50 MG tablet    Sig: TAKE 1 TABLET BY MOUTH 1 TIME A DAY. STORE AT ROOM TEMPERATURE.    Dispense:  30 tablet    Refill:  5    Supervising Provider:   EFRAIN LAMAR ORN [phone]    Prescription Type::   Renewal   Darunavir -Cobicistat-Emtricitabine -Tenofovir  Alafenamide (SYMTUZA ) 800-150-200-10 MG TABS    Sig: TAKE 1 TABLET BY MOUTH 1 TIME A DAY.    Dispense:  30 tablet    Refill:  5    Supervising Provider:  EFRAIN LAMAR ORN [[id number]]    Prescription Type::   Renewal    Return in about 6 months (around 05/10/2024).   Corean Fireman, MSN, NP-C [facility]  [email or web address] Pager: [phone] Office: [phone] [facility] Main Line: [phone] *Secure Chat Communication Welcome

## 2023-11-16 LAB — PAP IG W/ RFLX HPV ASCU

## 2023-11-18 ENCOUNTER — Telehealth: Payer: Self-pay

## 2023-11-18 NOTE — Telephone Encounter (Signed)
-----   Message from [place] sent at 11/18/2023  8:36 AM EST ----- Please call [name] to let her know that her pap smear is normal - we should repeat this for yer in 3 years (2028)

## 2023-11-18 NOTE — Telephone Encounter (Signed)
I attempted to reach the patient. No answer and LVM for her to call back. Tenaya Hilyer Jonathon Resides, CMA

## 2023-11-19 NOTE — Telephone Encounter (Signed)
Spoke with Zarie, relayed that pap smear was normal and this can be repeated in 3 years. [facility] was very happy to hear this news.   Sandie Ano, RN

## 2024-03-14 NOTE — Progress Notes (Signed)
 The 10-year ASCVD risk score (Arnett DK, et al., 2019) is: 11.6%   Values used to calculate the score:     Age: 53 years     Sex: Female     Is Non-Hispanic African American: Yes     Diabetic: Yes     Tobacco smoker: No     Systolic Blood Pressure: 134 mmHg     Is BP treated: No     HDL Cholesterol: 36 mg/dL     Total Cholesterol: 228 mg/dL  Arlon Bergamo, BSN, RN

## 2024-05-22 ENCOUNTER — Ambulatory Visit (INDEPENDENT_AMBULATORY_CARE_PROVIDER_SITE_OTHER): Payer: 59 | Admitting: Infectious Diseases

## 2024-05-22 ENCOUNTER — Other Ambulatory Visit: Payer: Self-pay

## 2024-05-22 ENCOUNTER — Encounter: Payer: Self-pay | Admitting: Infectious Diseases

## 2024-05-22 VITALS — BP 124/81 | HR 78 | Temp 98.3°F | Ht 63.0 in | Wt 152.0 lb

## 2024-05-22 DIAGNOSIS — N951 Menopausal and female climacteric states: Secondary | ICD-10-CM

## 2024-05-22 DIAGNOSIS — B20 Human immunodeficiency virus [HIV] disease: Secondary | ICD-10-CM

## 2024-05-22 MED ORDER — SYMTUZA 800-150-200-10 MG PO TABS
ORAL_TABLET | ORAL | 4 refills | Status: DC
Start: 2024-05-22 — End: 2024-05-26

## 2024-05-22 MED ORDER — TIVICAY 50 MG PO TABS: ORAL_TABLET | ORAL | 4 refills | Status: DC

## 2024-05-22 MED ORDER — ESTRADIOL-NORETHINDRONE ACET 1-0.5 MG PO TABS: 1.0000 | ORAL_TABLET | Freq: Every day | ORAL | 2 refills | Status: AC

## 2024-05-22 MED ORDER — TRAZODONE HCL 50 MG PO TABS: 25.0000 mg | ORAL_TABLET | Freq: Every evening | ORAL | 2 refills | Status: DC | PRN

## 2024-05-22 NOTE — Progress Notes (Signed)
 Name: [name]  DOB: 1971-09-10 MRN: [id number] PCP: Pcp, No    Subjective:   Chief Complaint  Patient presents with   Follow-up     Discussed the use of AI scribe software for clinical note transcription with the patient, who gave verbal consent to proceed.  History of Present Illness   [name] is a 53 year old female with HIV who presents for routine follow-up care.  She has been stable on her current HIV medication, Symtuza  + Tivicay , which she takes once daily together with a small meal. She considered switching to injectable treatment and would like to discuss a little more about that.   She experiences difficulty falling asleep, which has been persistent. She has tried melatonin without consistent relief. Her bedtime routine involves going to bed around 9 PM as she wakes up early for work. She avoids watching TV due to negative content and prefers listening to music. She also wakes up feeling hot and sweaty, attributing this to room temperature fluctuations. She reports that her last menstrual period was three years ago and that she experiences symptoms such as hot flashes and night sweats.  She has a history of high cholesterol, previously experiencing dry mouth with medication, but this has improved with her current treatment. She reports irritability and mood changes, which she attributes to lack of sleep and possibly hormonal changes. She has not had a period for three years, consistent with menopause.  Her social history includes working early hours and having two children, a 78 year old daughter and an 84-year-old son. She mentions family stress related to financial issues and differences in beliefs with her husband, causing tension. She uses the Bible for personal support and guidance.         05/22/2024    2:26 PM  Depression screen PHQ 2/9  Down, Depressed, Hopeless 2  PHQ - 2 Score 2  Altered sleeping 3  Tired, decreased energy 2  Change in appetite 0  Feeling  bad or failure about yourself  1  Trouble concentrating 1  Moving slowly or fidgety/restless 0  Suicidal thoughts 1  PHQ-9 Score 10  Difficult doing work/chores Somewhat difficult    Review of Systems  Constitutional:  Positive for diaphoresis and malaise/fatigue. Negative for chills and fever.  Respiratory:  Negative for shortness of breath.   Cardiovascular:  Negative for chest pain.  Gastrointestinal:  Negative for abdominal pain, diarrhea, nausea and vomiting.  Genitourinary:  Negative for dysuria.  Musculoskeletal:  Negative for falls and joint pain.  Skin:  Negative for rash.  Neurological:  Negative for dizziness and headaches.  Psychiatric/Behavioral:  The patient has insomnia.        Irritable      Past Medical History:  Diagnosis Date   Abnormal Pap smear    Depression    History of seizures    2019 or 2020 seizure x 1 no cause found no current neurologist   HIV disease (HCC)    Seizure (HCC) 02/04/2014   grand mal seizure x 1 hospital admission  no seizure since    Outpatient Medications Prior to Visit  Medication Sig Dispense Refill   ASPIRIN ADULT PO Take by mouth.     escitalopram (LEXAPRO) 20 MG tablet TAKE 1 TABLET BY MOUTH ONCE DAILY 30 tablet 2   ibuprofen  (ADVIL ) 800 MG tablet Take 1 tablet (800 mg total) by mouth every 8 (eight) hours as needed for moderate pain or mild pain. 30 tablet 0   meclizine  (  ANTIVERT ) 50 MG tablet Take 1 tablet (50 mg total) by mouth 3 (three) times daily as needed. 30 tablet 0   methocarbamol  (ROBAXIN ) 500 MG tablet Take 1 tablet (500 mg total) by mouth every 8 (eight) hours as needed for muscle spasms. 60 tablet 0   ondansetron  (ZOFRAN ) 4 MG tablet Take 1 tablet (4 mg total) by mouth every 8 (eight) hours as needed for nausea or vomiting. 12 tablet 0   Darunavir -Cobicistat-Emtricitabine -Tenofovir  Alafenamide (SYMTUZA ) 800-150-200-10 MG TABS TAKE 1 TABLET BY MOUTH 1 TIME A DAY. 30 tablet 5   dolutegravir  (TIVICAY ) 50 MG tablet  TAKE 1 TABLET BY MOUTH 1 TIME A DAY. STORE AT ROOM TEMPERATURE. 30 tablet 5   traZODone  (DESYREL ) 50 MG tablet Take 0.5 tablets (25 mg total) by mouth at bedtime. 30 tablet 3   No facility-administered medications prior to visit.     No Known Allergies  Social History   Tobacco Use   Smoking status: Never   Smokeless tobacco: Never  Vaping Use   Vaping status: Never Used  Substance Use Topics   Alcohol use: No   Drug use: No    No family history on file.  Social History   Substance and Sexual Activity  Sexual Activity Yes   Partners: Male   Birth control/protection: None   Comment: pt given condoms     Objective:   Vitals:   05/22/24 1432  BP: 124/81  Pulse: 78  Temp: 98.3 F (36.8 C)  TempSrc: Oral  SpO2: 98%  Weight: 152 lb (68.9 kg)  Height: 5' 3 (1.6 m)   Body mass index is 26.93 kg/m.  Physical Exam Vitals reviewed.  Constitutional:      Appearance: Normal appearance. She is not ill-appearing.  HENT:     Mouth/Throat:     Mouth: Mucous membranes are moist.     Pharynx: Oropharynx is clear.  Eyes:     General: No scleral icterus. Cardiovascular:     Rate and Rhythm: Normal rate.  Pulmonary:     Effort: Pulmonary effort is normal.  Neurological:     Mental Status: She is oriented to person, place, and time.  Psychiatric:        Mood and Affect: Mood normal.        Thought Content: Thought content normal.     Lab Results Lab Results  Component Value Date   WBC 3.8 (L) 02/24/2023   HGB 14.6 02/24/2023   HCT 42.1 02/24/2023   MCV 91.7 02/24/2023   PLT 218 02/24/2023    Lab Results  Component Value Date   CREATININE 1.66 (H) 12/30/2022   BUN 21 (H) 12/30/2022   NA 135 12/30/2022   K 4.3 12/30/2022   CL 105 12/30/2022   CO2 22 12/30/2022    Lab Results  Component Value Date   ALT 28 12/30/2022   AST 35 12/30/2022   ALKPHOS 85 12/30/2022   BILITOT 0.6 12/30/2022    Lab Results  Component Value Date   CHOL 228 (H)  04/24/2022   HDL 36 (L) 04/24/2022   LDLCALC 165 (H) 04/24/2022   TRIG 137 04/24/2022   CHOLHDL 6.3 (H) 04/24/2022   HIV 1 RNA Quant (Copies/mL)  Date Value  07/29/2023 67 (H)  11/16/2022 37 (H)  08/03/2022 98 (H)   CD4 T Cell Abs (/uL)  Date Value  07/29/2023 380 (L)  11/16/2022 323 (L)  06/23/2022 391 (L)     Assessment & Plan:     HIV  infection - Well-managed HIV infection with Symtuza  + Tivicay  once daily. Prefers oral medication due to convenience and discomfort with injections and my concern that the Cabenuva regimen may not be enough for her complete treatment.  Pap smear UTD, mammogram UTD. On statin treatment  - Continue Symtuza  + Tivicay  together once daily. - Attempt to prescribe a 90-day supply of medication, contingent on insurance approval. - Perform blood work to monitor HIV status.  Menopausal symptoms - Menopausal symptoms including hot flashes, night sweats, irritability, insomnia and mood changes, likely due to decreased estrogen and progesterone levels. No menstrual periods for three years. No family history of breast cancer and non-smoker, no VTE Hx; suitable for hormone replacement therapy. Benefits of hormone therapy for bone health and reducing heart disease risk discussed.  - Prescribe Activella 0.5/1 mg for hormone replacement therapy for combination therapy > 1 year post menopause.  - Monitor for side effects such as nausea, breast tenderness, or spotting. - repeat blood pressure at return, OK today - Perform blood work today including CMP, lipid, THS  - Consider counseling if irritability affects relationships or work.  Insomnia - Chronic insomnia with difficulty falling asleep, possibly exacerbated by menopausal symptoms and night sweats. Previous use of melatonin was ineffective. Sleep hygiene and environmental factors affecting sleep discussed. Trazodone  offered as an alternative to hydroxyzine due to concerns about dry mouth. - Prescribe  trazodone  for sleep, to be taken one hour before bedtime. - Advise on sleep hygiene: meal at least two hours before bed, cool and dark room. - Monitor effectiveness and side effects of trazodone .          Corean Fireman, MSN, NP-C [facility] Pager: [phone] Office: [phone]  05/22/24  3:43 PM

## 2024-05-22 NOTE — Patient Instructions (Signed)
 Please continue your Biktarvy eveyrday   For the mood, hot flashes, night sweats, trouble sleeping I think you may have a good benefit from starting some hormone replacement therapy   Activella can be taken with your other medication once a day    For Sleep Support -   Trazodone  1/2 or 1 tablet at night 45-60 minutes before you plan to sleep is a good start.   Recommendations for improving sleep:  Avoid having pets sleep in the bedroom Avoid caffeine consumption after 4pm Keep bedroom cool and conducive to sleep Avoid nicotine use, especially in the evening Avoid exercise within 2-3 hours before bedtime  Stimulus Control:  Go to bed only when sleepy Use the bedroom for sleep and sex only Go to another room if you are unable to fall asleep within 15 to 20 minutes Read or engage in other quiet activities and return to bed only when sleepy.

## 2024-05-23 LAB — T-HELPER CELLS (CD4) COUNT (NOT AT ARMC)
CD4 % Helper T Cell: 19 % — ABNORMAL LOW (ref 33–65)
CD4 T Cell Abs: 488 /uL (ref 400–1790)

## 2024-05-24 LAB — CBC WITH DIFFERENTIAL/PLATELET
Absolute Lymphocytes: 2539 {cells}/uL (ref 850–3900)
Absolute Monocytes: 428 {cells}/uL (ref 200–950)
Basophils Absolute: 28 {cells}/uL (ref 0–200)
Basophils Relative: 0.6 %
Eosinophils Absolute: 189 {cells}/uL (ref 15–500)
Eosinophils Relative: 4.1 %
HCT: 42.8 % (ref 35.0–45.0)
Hemoglobin: 14.1 g/dL (ref 11.7–15.5)
MCH: 31.3 pg (ref 27.0–33.0)
MCHC: 32.9 g/dL (ref 32.0–36.0)
MCV: 95.1 fL (ref 80.0–100.0)
MPV: 10.9 fL (ref 7.5–12.5)
Monocytes Relative: 9.3 %
Neutro Abs: 1417 {cells}/uL — ABNORMAL LOW (ref 1500–7800)
Neutrophils Relative %: 30.8 %
Platelets: 203 Thousand/uL (ref 140–400)
RBC: 4.5 Million/uL (ref 3.80–5.10)
RDW: 13 % (ref 11.0–15.0)
Total Lymphocyte: 55.2 %
WBC: 4.6 Thousand/uL (ref 3.8–10.8)

## 2024-05-24 LAB — LIPID PANEL
Cholesterol: 271 mg/dL — ABNORMAL HIGH (ref <200)
HDL: 62 mg/dL (ref >=50)
LDL Cholesterol (Calc): 152 mg/dL — ABNORMAL HIGH
Non-HDL Cholesterol (Calc): 209 mg/dL — ABNORMAL HIGH (ref <130)
Total CHOL/HDL Ratio: 4.4 (calc) (ref <5.0)
Triglycerides: 372 mg/dL — ABNORMAL HIGH (ref <150)

## 2024-05-24 LAB — COMPLETE METABOLIC PANEL WITHOUT GFR
AG Ratio: 1.3 (calc) (ref 1.0–2.5)
ALT: 21 U/L (ref 6–29)
AST: 21 U/L (ref 10–35)
Albumin: 4.3 g/dL (ref 3.6–5.1)
Alkaline phosphatase (APISO): 101 U/L (ref 37–153)
BUN/Creatinine Ratio: 13 (calc) (ref 6–22)
BUN: 16 mg/dL (ref 7–25)
CO2: 25 mmol/L (ref 20–32)
Calcium: 10.3 mg/dL (ref 8.6–10.4)
Chloride: 103 mmol/L (ref 98–110)
Creat: 1.27 mg/dL — ABNORMAL HIGH (ref 0.50–1.03)
Globulin: 3.3 g/dL (ref 1.9–3.7)
Glucose, Bld: 81 mg/dL (ref 65–99)
Potassium: 4.2 mmol/L (ref 3.5–5.3)
Sodium: 139 mmol/L (ref 135–146)
Total Bilirubin: 0.3 mg/dL (ref 0.2–1.2)
Total Protein: 7.6 g/dL (ref 6.1–8.1)

## 2024-05-24 LAB — HIV-1 RNA QUANT-NO REFLEX-BLD
HIV 1 RNA Quant: <20 {copies}/mL — AB
HIV-1 RNA Quant, Log: <1.3 {Log_copies}/mL — AB

## 2024-05-24 LAB — TSH: TSH: 0.83 m[IU]/L

## 2024-05-26 ENCOUNTER — Other Ambulatory Visit: Payer: Self-pay | Admitting: Infectious Diseases

## 2024-05-26 DIAGNOSIS — B20 Human immunodeficiency virus [HIV] disease: Secondary | ICD-10-CM

## 2024-05-26 NOTE — Telephone Encounter (Signed)
 Patient uses [facility]. Called [facility] and cancelled prescriptions.   Freedom Peddy, BSN, RN

## 2024-05-30 ENCOUNTER — Ambulatory Visit: Payer: Self-pay | Admitting: Infectious Diseases

## 2024-05-30 NOTE — Telephone Encounter (Signed)
 Patient made aware of results and is expressed she is very pleased with the care that [facility] provides to her and thanks all of the staff for being patient and kind to her.  Enis Kleine, LPN

## 2024-05-30 NOTE — Telephone Encounter (Signed)
-----   Message from [place] sent at 05/30/2024  1:34 PM EDT ----- Please call [name] to let her know that her viral load remains undetectable and her immune system continues to look strong with CD4 488- great!   Her kidney function tests look improved from last check - good!   Her thyroid  function is normal range and likely not contributing to her symptoms we talked about.  No changes from me - will see her back as scheduled.   ~Stephanie   ----- Message ----- From: Rebecka Hose Lab Results In Sent: 05/22/2024  10:36 PM EDT To: [name], NP

## 2024-08-22 ENCOUNTER — Ambulatory Visit: Admitting: Infectious Diseases

## 2024-08-28 ENCOUNTER — Other Ambulatory Visit: Payer: Self-pay

## 2024-08-28 ENCOUNTER — Ambulatory Visit (INDEPENDENT_AMBULATORY_CARE_PROVIDER_SITE_OTHER)

## 2024-08-28 DIAGNOSIS — Z23 Encounter for immunization: Secondary | ICD-10-CM

## 2024-09-05 NOTE — Progress Notes (Signed)
 Vaccination visit noted.

## 2024-09-19 ENCOUNTER — Encounter: Payer: Self-pay | Admitting: Infectious Diseases

## 2024-09-19 ENCOUNTER — Ambulatory Visit (INDEPENDENT_AMBULATORY_CARE_PROVIDER_SITE_OTHER): Admitting: Infectious Diseases

## 2024-09-19 ENCOUNTER — Other Ambulatory Visit: Payer: Self-pay

## 2024-09-19 VITALS — BP 129/83 | HR 67 | Temp 98.0°F | Ht 63.0 in | Wt 150.0 lb

## 2024-09-19 DIAGNOSIS — E7800 Pure hypercholesterolemia, unspecified: Secondary | ICD-10-CM | POA: Diagnosis not present

## 2024-09-19 DIAGNOSIS — Z79899 Other long term (current) drug therapy: Secondary | ICD-10-CM

## 2024-09-19 DIAGNOSIS — B20 Human immunodeficiency virus [HIV] disease: Secondary | ICD-10-CM

## 2024-09-19 DIAGNOSIS — J309 Allergic rhinitis, unspecified: Secondary | ICD-10-CM

## 2024-09-19 DIAGNOSIS — Z1231 Encounter for screening mammogram for malignant neoplasm of breast: Secondary | ICD-10-CM

## 2024-09-19 MED ORDER — ATORVASTATIN CALCIUM 20 MG PO TABS: 20.0000 mg | ORAL_TABLET | Freq: Every day | ORAL | 2 refills | Status: AC

## 2024-09-19 NOTE — Patient Instructions (Signed)
 Nice to see you!   Start the lower dose different cholesterol medication - atorvastatin once a day to see if you tolerate this better.   Come back to see me in 1-2 months to check in on the new medication. Will do labs for you then.

## 2024-09-19 NOTE — Progress Notes (Signed)
 Name: [name]  DOB: 04-02-1971 MRN: [id number] PCP: Pcp, No    Subjective:   Chief Complaint  Patient presents with   Follow-up     Discussed the use of AI scribe software for clinical note transcription with the patient, who gave verbal consent to proceed.  History of Present Illness   [name] is a 53 year old female with HIV who presents for routine follow-up care.  She is currently taking Symtuza  and Tivicay  together daily for HIV, and her last viral load was undetectable per prior lab results.   She has recently developed a dry cough that worsens without water and causes her eyes to water. She attributes this to seasonal allergies, as she does not experience a runny nose or dizziness. Her throat becomes very dry when she takes her depression medication at midnight.  She has a history of high cholesterol and has previously tried medication for it, which caused her throat to become very dry. She is concerned about her cholesterol levels and has previously tried medication for it, which caused her throat to become very dry.  She maintains a healthy lifestyle by consuming a homemade blend from lung cartilage every morning. She works early hours, starting at 5 AM, and enjoys her job, particularly interacting with students. She emphasizes the importance of self-care and love.         05/22/2024    2:26 PM  Depression screen PHQ 2/9  Down, Depressed, Hopeless 2  PHQ - 2 Score 2  Altered sleeping 3  Tired, decreased energy 2  Change in appetite 0  Feeling bad or failure about yourself  1  Trouble concentrating 1  Moving slowly or fidgety/restless 0  Suicidal thoughts 1  PHQ-9 Score 10   Difficult doing work/chores Somewhat difficult     Data saved with a previous flowsheet row definition    Review of Systems  Constitutional:  Positive for diaphoresis and malaise/fatigue. Negative for chills and fever.  Respiratory:  Negative for shortness of breath.    Cardiovascular:  Negative for chest pain.  Gastrointestinal:  Negative for abdominal pain, diarrhea, nausea and vomiting.  Genitourinary:  Negative for dysuria.  Musculoskeletal:  Negative for falls and joint pain.  Skin:  Negative for rash.  Neurological:  Negative for dizziness and headaches.  Psychiatric/Behavioral:  The patient has insomnia.        Irritable      Past Medical History:  Diagnosis Date   Abnormal Pap smear    Depression    History of seizures    2019 or 2020 seizure x 1 no cause found no current neurologist   HIV disease (HCC)    Seizure (HCC) 02/04/2014   grand mal seizure x 1 hospital admission  no seizure since    Outpatient Medications Prior to Visit  Medication Sig Dispense Refill   ASPIRIN ADULT PO Take by mouth.     dolutegravir  (TIVICAY ) 50 MG tablet TAKE 1 TABLET BY MOUTH 1 TIME A DAY 30 tablet 5   escitalopram (LEXAPRO) 20 MG tablet TAKE 1 TABLET BY MOUTH ONCE DAILY 30 tablet 2   estradiol -norethindrone  (ACTIVELLA) 1-0.5 MG tablet Take 1 tablet by mouth daily. 30 tablet 2   ibuprofen  (ADVIL ) 800 MG tablet Take 1 tablet (800 mg total) by mouth every 8 (eight) hours as needed for moderate pain or mild pain. 30 tablet 0   methocarbamol  (ROBAXIN ) 500 MG tablet Take 1 tablet (500 mg total) by mouth every 8 (eight) hours  as needed for muscle spasms. 60 tablet 0   ondansetron  (ZOFRAN ) 4 MG tablet Take 1 tablet (4 mg total) by mouth every 8 (eight) hours as needed for nausea or vomiting. 12 tablet 0   SYMTUZA  800-150-200-10 MG TABS TAKE 1 TABLET BY MOUTH 1 TIME A DAY 30 tablet 5   traZODone  (DESYREL ) 50 MG tablet Take 0.5-1 tablets (25-50 mg total) by mouth at bedtime as needed for sleep. 30 tablet 2   meclizine  (ANTIVERT ) 50 MG tablet Take 1 tablet (50 mg total) by mouth 3 (three) times daily as needed. 30 tablet 0   No facility-administered medications prior to visit.     No Known Allergies  Social History   Tobacco Use   Smoking status: Never    Smokeless tobacco: Never  Vaping Use   Vaping status: Never Used  Substance Use Topics   Alcohol use: No   Drug use: No    No family history on file.  Social History   Substance and Sexual Activity  Sexual Activity Yes   Partners: Male   Birth control/protection: None   Comment: pt given condoms     Objective:   Vitals:   09/19/24 1422  BP: 129/83  Pulse: 67  Temp: 98 F (36.7 C)  TempSrc: Oral  SpO2: 98%  Weight: 150 lb (68 kg)  Height: 5' 3 (1.6 m)   Body mass index is 26.57 kg/m.  Physical Exam Vitals reviewed.  Constitutional:      Appearance: Normal appearance. She is not ill-appearing.  HENT:     Mouth/Throat:     Mouth: Mucous membranes are moist.     Pharynx: Oropharynx is clear.  Eyes:     General: No scleral icterus. Cardiovascular:     Rate and Rhythm: Normal rate.  Pulmonary:     Effort: Pulmonary effort is normal.  Neurological:     Mental Status: She is oriented to person, place, and time.  Psychiatric:        Mood and Affect: Mood normal.        Thought Content: Thought content normal.     Lab Results Lab Results  Component Value Date   WBC 4.6 05/22/2024   HGB 14.1 05/22/2024   HCT 42.8 05/22/2024   MCV 95.1 05/22/2024   PLT 203 05/22/2024    Lab Results  Component Value Date   CREATININE 1.27 (H) 05/22/2024   BUN 16 05/22/2024   NA 139 05/22/2024   K 4.2 05/22/2024   CL 103 05/22/2024   CO2 25 05/22/2024    Lab Results  Component Value Date   ALT 21 05/22/2024   AST 21 05/22/2024   ALKPHOS 85 12/30/2022   BILITOT 0.3 05/22/2024    Lab Results  Component Value Date   CHOL 271 (H) 05/22/2024   HDL 62 05/22/2024   LDLCALC 152 (H) 05/22/2024   TRIG 372 (H) 05/22/2024   CHOLHDL 4.4 05/22/2024   HIV 1 RNA Quant  Date Value  05/22/2024 <20 DETECTED copies/mL (A)  07/29/2023 67 Copies/mL (H)  11/16/2022 37 Copies/mL (H)   CD4 T Cell Abs (/uL)  Date Value  05/22/2024 488  07/29/2023 380 (L)  11/16/2022 323  (L)     Assessment & Plan:       Human immunodeficiency virus (HIV) infection - HIV infection is well-controlled with an undetectable viral load and a CD4 count of 488, indicating a strong immune system. Continued antiretroviral therapy is essential to prevent immune system deterioration and  potential life-threatening complications. - Continue Symtuza  and Tivicay  regimen daily with food. - viral load continues to be suppressed and healthy CD4 count. Defer labs today - reassess in 1-2 months when we need to check her liver function / lipids with statin start.   Hypercholesterolemia -  Cholesterol levels are elevated, increasing the risk of cardiovascular events. Previous intolerance to high-dose Crestor due to dry throat. A different cholesterol medication is recommended to reduce cardiovascular risk, especially in the context of HIV. Recent studies show a 35% reduction in heart attack and stroke risk with cholesterol management in HIV patients. - Prescribed a different cholesterol medication with a lower dose to improve tolerance. - Scheduled follow-up in two months to assess tolerance and effectiveness of the new medication.  Allergic rhinitis - Symptoms include dry cough and runny eyes, likely due to seasonal allergies. No nasal congestion reported. Symptoms are mild and may be exacerbated by environmental factors such as wind and falling leaves. - Provided allergy medication sample for trial use. - Advised to monitor symptoms and report any worsening or new symptoms.     Meds ordered this encounter  Medications   atorvastatin  (LIPITOR) 20 MG tablet    Sig: Take 1 tablet (20 mg total) by mouth daily.    Dispense:  30 tablet    Refill:  2   Orders Placed This Encounter  Procedures   MM 3D SCREENING MAMMOGRAM BILATERAL BREAST    [employer]ET[employer] PF EPIC ORDER    Standing Status:   Future    Expiration Date:   09/26/2025    Reason for Exam (SYMPTOM  OR DIAGNOSIS REQUIRED):   screening  mamogram    Is the patient pregnant?:   No    Preferred imaging location?:   [facility]   Return in about 2 months (around 11/19/2024).    Anita Fireman, MSN, NP-C [facility] Pager: [phone] Office: [phone]  09/26/24  2:49 PM

## 2024-09-26 ENCOUNTER — Other Ambulatory Visit: Payer: Self-pay | Admitting: Infectious Diseases

## 2024-09-26 DIAGNOSIS — N6310 Unspecified lump in the right breast, unspecified quadrant: Secondary | ICD-10-CM

## 2024-09-27 ENCOUNTER — Telehealth: Payer: Self-pay

## 2024-09-27 NOTE — Telephone Encounter (Signed)
-----   Message from [place] sent at 09/26/2024  2:29 PM EST ----- Hi Earlie Schank   I noticed Moncerrat did not schedule a follow up with me in 2 months - can you reach out to her to schedule? Thank you!   Tell her Dr. Efrain told me to tell her hello and he hopes she is doing well

## 2024-09-27 NOTE — Telephone Encounter (Signed)
 Left patient a voice mail to call back to schedule a 2 month follow up with [name].

## 2024-10-02 ENCOUNTER — Other Ambulatory Visit: Payer: Self-pay | Admitting: Infectious Diseases

## 2024-10-24 ENCOUNTER — Other Ambulatory Visit: Payer: Self-pay | Admitting: Infectious Diseases

## 2024-10-24 DIAGNOSIS — R928 Other abnormal and inconclusive findings on diagnostic imaging of breast: Secondary | ICD-10-CM

## 2024-11-03 ENCOUNTER — Other Ambulatory Visit

## 2024-11-03 ENCOUNTER — Encounter

## 2024-11-22 ENCOUNTER — Other Ambulatory Visit: Payer: Self-pay | Admitting: Infectious Diseases

## 2024-11-22 DIAGNOSIS — B20 Human immunodeficiency virus [HIV] disease: Secondary | ICD-10-CM

## 2024-12-05 ENCOUNTER — Ambulatory Visit: Admitting: Infectious Diseases

## 2024-12-20 ENCOUNTER — Other Ambulatory Visit

## 2024-12-20 ENCOUNTER — Encounter
